# Patient Record
Sex: Female | Born: 1945 | Race: Black or African American | Hispanic: No | Smoking: Light tobacco smoker
Health system: Southern US, Community
[De-identification: ages and names within clinical notes are randomized; demographics above are authoritative.]

## PROBLEM LIST (undated history)

## (undated) DIAGNOSIS — I209 Angina pectoris, unspecified: Secondary | ICD-10-CM

## (undated) DIAGNOSIS — M329 Systemic lupus erythematosus, unspecified: Secondary | ICD-10-CM

## (undated) DIAGNOSIS — J45909 Unspecified asthma, uncomplicated: Secondary | ICD-10-CM

## (undated) DIAGNOSIS — D649 Anemia, unspecified: Secondary | ICD-10-CM

## (undated) DIAGNOSIS — I219 Acute myocardial infarction, unspecified: Secondary | ICD-10-CM

## (undated) DIAGNOSIS — K219 Gastro-esophageal reflux disease without esophagitis: Secondary | ICD-10-CM

## (undated) DIAGNOSIS — H269 Unspecified cataract: Secondary | ICD-10-CM

## (undated) DIAGNOSIS — I10 Essential (primary) hypertension: Secondary | ICD-10-CM

## (undated) DIAGNOSIS — M1990 Unspecified osteoarthritis, unspecified site: Secondary | ICD-10-CM

## (undated) DIAGNOSIS — J439 Emphysema, unspecified: Secondary | ICD-10-CM

## (undated) DIAGNOSIS — I2510 Atherosclerotic heart disease of native coronary artery without angina pectoris: Secondary | ICD-10-CM

## (undated) HISTORY — DX: Anemia, unspecified: D64.9

## (undated) HISTORY — DX: Unspecified asthma, uncomplicated: J45.909

## (undated) HISTORY — DX: Unspecified cataract: H26.9

## (undated) HISTORY — DX: Acute myocardial infarction, unspecified: I21.9

## (undated) HISTORY — DX: Unspecified osteoarthritis, unspecified site: M19.90

## (undated) HISTORY — DX: Emphysema, unspecified: J43.9

---

## 1993-12-11 HISTORY — PX: CORONARY STENT PLACEMENT: SHX1402

## 1998-08-04 ENCOUNTER — Ambulatory Visit (HOSPITAL_COMMUNITY): Admission: RE | Admit: 1998-08-04 | Discharge: 1998-08-04 | Payer: Self-pay | Admitting: Internal Medicine

## 1998-08-19 ENCOUNTER — Ambulatory Visit (HOSPITAL_COMMUNITY): Admission: RE | Admit: 1998-08-19 | Discharge: 1998-08-19 | Payer: Self-pay | Admitting: Gastroenterology

## 1999-10-14 ENCOUNTER — Ambulatory Visit (HOSPITAL_COMMUNITY): Admission: RE | Admit: 1999-10-14 | Discharge: 1999-10-14 | Payer: Self-pay | Admitting: Obstetrics and Gynecology

## 1999-10-14 ENCOUNTER — Encounter (INDEPENDENT_AMBULATORY_CARE_PROVIDER_SITE_OTHER): Payer: Self-pay

## 2000-01-18 ENCOUNTER — Encounter: Admission: RE | Admit: 2000-01-18 | Discharge: 2000-01-18 | Payer: Self-pay | Admitting: Internal Medicine

## 2000-01-18 ENCOUNTER — Encounter: Payer: Self-pay | Admitting: Internal Medicine

## 2000-06-26 ENCOUNTER — Ambulatory Visit (HOSPITAL_COMMUNITY): Admission: RE | Admit: 2000-06-26 | Discharge: 2000-06-26 | Payer: Self-pay | Admitting: Cardiovascular Disease

## 2000-06-27 ENCOUNTER — Encounter: Payer: Self-pay | Admitting: Cardiovascular Disease

## 2000-07-06 ENCOUNTER — Encounter: Payer: Self-pay | Admitting: Cardiovascular Disease

## 2000-07-06 ENCOUNTER — Ambulatory Visit (HOSPITAL_COMMUNITY): Admission: RE | Admit: 2000-07-06 | Discharge: 2000-07-06 | Payer: Self-pay | Admitting: Cardiovascular Disease

## 2000-08-21 ENCOUNTER — Other Ambulatory Visit: Admission: RE | Admit: 2000-08-21 | Discharge: 2000-08-21 | Payer: Self-pay | Admitting: Obstetrics and Gynecology

## 2001-05-06 ENCOUNTER — Encounter: Payer: Self-pay | Admitting: Emergency Medicine

## 2001-05-06 ENCOUNTER — Emergency Department (HOSPITAL_COMMUNITY): Admission: EM | Admit: 2001-05-06 | Discharge: 2001-05-06 | Payer: Self-pay | Admitting: Emergency Medicine

## 2001-12-31 ENCOUNTER — Other Ambulatory Visit: Admission: RE | Admit: 2001-12-31 | Discharge: 2001-12-31 | Payer: Self-pay | Admitting: Obstetrics and Gynecology

## 2002-02-04 ENCOUNTER — Ambulatory Visit (HOSPITAL_COMMUNITY): Admission: RE | Admit: 2002-02-04 | Discharge: 2002-02-04 | Payer: Self-pay | Admitting: Obstetrics and Gynecology

## 2002-02-04 ENCOUNTER — Encounter: Payer: Self-pay | Admitting: Obstetrics and Gynecology

## 2002-02-20 ENCOUNTER — Encounter: Payer: Self-pay | Admitting: Obstetrics and Gynecology

## 2002-02-20 ENCOUNTER — Encounter: Admission: RE | Admit: 2002-02-20 | Discharge: 2002-02-20 | Payer: Self-pay | Admitting: Obstetrics and Gynecology

## 2003-02-25 ENCOUNTER — Emergency Department (HOSPITAL_COMMUNITY): Admission: EM | Admit: 2003-02-25 | Discharge: 2003-02-25 | Payer: Self-pay | Admitting: Emergency Medicine

## 2003-08-07 ENCOUNTER — Other Ambulatory Visit: Admission: RE | Admit: 2003-08-07 | Discharge: 2003-08-07 | Payer: Self-pay | Admitting: Obstetrics and Gynecology

## 2004-05-26 ENCOUNTER — Inpatient Hospital Stay (HOSPITAL_COMMUNITY): Admission: EM | Admit: 2004-05-26 | Discharge: 2004-05-30 | Payer: Self-pay | Admitting: Emergency Medicine

## 2004-07-04 ENCOUNTER — Ambulatory Visit (HOSPITAL_COMMUNITY): Admission: RE | Admit: 2004-07-04 | Discharge: 2004-07-04 | Payer: Self-pay | Admitting: Obstetrics and Gynecology

## 2005-09-14 ENCOUNTER — Encounter: Admission: RE | Admit: 2005-09-14 | Discharge: 2005-09-14 | Payer: Self-pay | Admitting: Cardiology

## 2005-11-08 ENCOUNTER — Encounter: Admission: RE | Admit: 2005-11-08 | Discharge: 2005-11-08 | Payer: Self-pay | Admitting: Gastroenterology

## 2005-12-08 ENCOUNTER — Ambulatory Visit (HOSPITAL_COMMUNITY): Admission: RE | Admit: 2005-12-08 | Discharge: 2005-12-08 | Payer: Self-pay | Admitting: Gastroenterology

## 2005-12-08 ENCOUNTER — Encounter (INDEPENDENT_AMBULATORY_CARE_PROVIDER_SITE_OTHER): Payer: Self-pay | Admitting: *Deleted

## 2006-06-19 ENCOUNTER — Inpatient Hospital Stay (HOSPITAL_BASED_OUTPATIENT_CLINIC_OR_DEPARTMENT_OTHER): Admission: RE | Admit: 2006-06-19 | Discharge: 2006-06-19 | Payer: Self-pay | Admitting: Cardiology

## 2006-10-11 ENCOUNTER — Encounter: Admission: RE | Admit: 2006-10-11 | Discharge: 2006-10-11 | Payer: Self-pay | Admitting: Cardiology

## 2006-12-11 LAB — HM COLONOSCOPY

## 2007-03-13 ENCOUNTER — Encounter: Admission: RE | Admit: 2007-03-13 | Discharge: 2007-03-13 | Payer: Self-pay | Admitting: Cardiology

## 2008-04-20 ENCOUNTER — Emergency Department (HOSPITAL_COMMUNITY): Admission: EM | Admit: 2008-04-20 | Discharge: 2008-04-20 | Payer: Self-pay | Admitting: Emergency Medicine

## 2008-09-08 ENCOUNTER — Encounter: Admission: RE | Admit: 2008-09-08 | Discharge: 2008-09-08 | Payer: Self-pay | Admitting: Cardiology

## 2009-08-29 ENCOUNTER — Emergency Department (HOSPITAL_COMMUNITY): Admission: EM | Admit: 2009-08-29 | Discharge: 2009-08-29 | Payer: Self-pay | Admitting: Family Medicine

## 2009-10-14 ENCOUNTER — Encounter: Admission: RE | Admit: 2009-10-14 | Discharge: 2009-10-14 | Payer: Self-pay | Admitting: Cardiology

## 2010-11-26 ENCOUNTER — Emergency Department (HOSPITAL_COMMUNITY)
Admission: EM | Admit: 2010-11-26 | Discharge: 2010-11-26 | Payer: Self-pay | Source: Home / Self Care | Admitting: Emergency Medicine

## 2010-12-31 ENCOUNTER — Encounter: Payer: Self-pay | Admitting: Cardiology

## 2011-01-01 ENCOUNTER — Encounter: Payer: Self-pay | Admitting: Obstetrics and Gynecology

## 2011-01-02 ENCOUNTER — Encounter: Payer: Self-pay | Admitting: Cardiology

## 2011-01-02 ENCOUNTER — Encounter
Admission: RE | Admit: 2011-01-02 | Discharge: 2011-01-02 | Payer: Self-pay | Source: Home / Self Care | Attending: Cardiology | Admitting: Cardiology

## 2011-04-28 NOTE — Cardiovascular Report (Signed)
NAME:  [name], [name]                        ACCOUNT NO.:  [id number]   MEDICAL RECORD NO.:  [id number]                   PATIENT TYPE:  INP   LOCATION:  3735                                 FACILITY:  [facility]   PHYSICIAN:  Mohan N. Sharyn Lull, M.D.              DATE OF BIRTH:  02/23/1946   DATE OF PROCEDURE:  05/27/2004  DATE OF DISCHARGE:                              CARDIAC CATHETERIZATION   PROCEDURE:  1. Left cardiac catheterization with selective right and left coronary     angiography, left ventriculography via the right groin using Judkins     technique.  2. Successful percutaneous transluminal coronary angioplasty to the ostial     ramus using 3.0 x 6 mm long cutting balloon,.  3. Successful deployment of 3.0 x 8 mm long Cypher drug-eluting stent in the     ostial and proximal ramus.   CARDIOLOGISTEduardo Osier Sharyn Lull, M.D.   INDICATIONS:  [name] is a 65 year old black female with past medical  history significant for asthmatic bronchitis, history of tobacco abuse,  history of lupus.  He came to the ER complaining of retrosternal chest pain  described as tightness and fullness off and on for the last 10 days, lasting  a few minutes, occasionally radiating to the left shoulder and left arm.  Rating was 8/10 and so desired to come to the ER.  The patient denies any  nausea, vomiting but complains of diaphoresis and mild shortness of breath.  The patient denies any palpitations, lightheadedness or syncope.  Denies  rest or nocturnal angina.  Denies PND or orthopnea or leg swelling.  Denies  cough, fever, chills.  Denies relation of chest pain to breathing, food.  The patient was noted to have mildly elevated troponin I with normal CPKs.  An EKG done in the ER showed normal sinus rhythm with __________ Q-waves,  poor R-wave progression in V1 to V4 and nonspecific T-wave changes which  were new as compared to a prior EKG.  The patient was admitted to CCU.  The  patient  ruled in for non-Q wave infarction due to mildly elevated troponin-  I.  Her repeat two sets of troponin-I were 0.36 and 0.30, although CPKs were  normal.  Due to typical chest pain and multiple risk factors and elevated  troponin-I, discussed with patient regarding left catheterization, possible  PTCA and stenting, risks of death, myocardial infarction, stroke, need for  emergency CABG, risks of restenosis, local vascular complications, etc., and  consented for PCI.   DESCRIPTION OF PROCEDURE:  After obtaining the informed consent, the patient  was brought to the catheterization lab and was placed on the fluoroscopy  table.  The right groin was prepped and draped in the usual fashion.  Xylocaine 2% was used for local anesthesia in the right groin.  With the  help of a thin wall needle, a 6 French arterial sheath was placed.  The  sheath was aspirated and flushed.  Next, 6 French left Judkins catheter was  advanced over the wire under fluoroscopic guidance up to the ascending  aorta.  The wire was pulled out.  The catheter was aspirated and connected  to the manifold.  The catheter was further advanced and engaged into the  left coronary ostium.  Multiple views of the left system were taken.  Next,  the catheter was disengaged and was pulled out over the wire and was  replaced with a 6 French right Judkins catheter, which was advanced over the  wire under fluoroscopic guidance to the ascending aorta.  The wire was  pulled out.  The catheter was aspirated and connected to the manifold.  The  catheter was further advanced and engaged into the right coronary ostium.  Multiple views of the right system were taken.  Next, the catheter was  disengaged and was pulled out over the wire and was replaced with a 6 French  pigtail catheter, which was advanced over the wire under fluoroscopic  guidance up to the ascending aorta.  The wire was pulled out.  The catheter  was aspirated and connected to  the manifold.  The catheter was further  advanced across the aortic valve into the LV.  The LV pressures were  recorded.  Next left ventriculography was done in 30 degree RAO position.  Post angiographic pressures were recorded from LV, and then pullback  pressures were recorded from the aorta.  There was no gradient across the  aortic valve.   Next, the pigtail catheter was pulled out over the wire.  Sheaths were  aspirated and flushed.   FINDINGS:  1. Left ventricle show good LV systolic function with an EF of 55-60%.  2. The left main was patent.  3. The left anterior descending has 20-30% mid stenosis.  The diagonal I was     very, very small which was patent.  The diagonal II was very small which     was patent.  The ramus was large which has 80-90% focal ostial stenosis     with haziness which appears to be the culprit lesion.  4. The left circumflex was small which was patent.  5. The right coronary artery was patent.   INTERVENTIONAL PROCEDURE:  Successful percutaneous transluminal coronary  angioplasty to the ostial ramus was done using 3.0 x 6 mm long cutting  balloon.  Multiple inflations were done going up to 4 atmospheres of  pressure with persistent haziness and mild elastic recoil.  Then a 3.0 x 8  mm long Cypher drug-eluting stent was deployed at 10 atmospheres of pressure  which was fully expanded up to 13 atmospheres of pressure.  The lesion was  dilated from 80-90% with haziness to less than 10% residual with excellent  TIMI-3 distal flow without evidence of dissection or distal embolization.  The patient received weight-based heparin, Integrilin and 600 mg of Plavix  and IC nitroglycerin during the procedure.  The patient tolerated the  procedure well.  There were no complications.  The patient was transferred  to the recovery room in stable condition.                                               Eduardo Osier. Sharyn Lull, M.D.   MNH/MEDQ  D:  05/27/2004  T:   05/29/2004  Job:  [id number]   cc:   Minerva Areola L. August Saucer, M.D.  [address] [address]  [place]  [place] [address]  Fax: [phone]   Catheterization Lab

## 2011-04-28 NOTE — Discharge Summary (Signed)
NAME:  [name], [name]                        ACCOUNT NO.:  [id number]   MEDICAL RECORD NO.:  [id number]                   PATIENT TYPE:  INP   LOCATION:  3735                                 FACILITY:  [facility]   PHYSICIAN:  Mohan N. Sharyn Lull, M.D.              DATE OF BIRTH:  Apr 04, 1946   DATE OF ADMISSION:  05/26/2004  DATE OF DISCHARGE:  05/30/2004                                 DISCHARGE SUMMARY   ADMITTING DIAGNOSES:  1. Probable recent small non-Q-wave myocardial infarction.  2. Tobacco abuse.  3. Morbid obesity.  4. Lupus.  5. Postmenopausal.   FINAL DIAGNOSES:  1. Status post small non-Q-wave myocardial infarction.  2. Status post percutaneous transluminal coronary angioplasty stenting to     ostial ramus.  3. Hypertension.  4. Tobacco abuse.  5. Morbid obesity.  6. History of lupus.  7. Postmenopausal.  8. Morbid obesity.   DISCHARGE HOME MEDICATIONS:  1. Enteric-coated aspirin 325 mg 1 tablet daily.  2. Plavix 75 mg 1 tablet daily with food.  3. Toprol-XL 25 mg 1 tablet daily.  4. Lipitor 10 mg 1 tablet daily.  5. Nitrostat 0.4 mg sublingual -- use as directed.   ACTIVITY:  Avoid heavy lifting, pushing or pulling for 48 hours.   DIET:  Low-salt, low-cholesterol diet.   DISCHARGE INSTRUCTIONS:  Post-angioplasty and stent instructions have been  given.   FOLLOWUP:  Follow up with me in 1 week and Dr. Minerva Areola L. Dean as scheduled.   CONDITION AT DISCHARGE:  Stable.   BRIEF HISTORY:  Ms. Bradstreet is a 65 year old black female with a past  medical history significant for asthmatic bronchitis, tobacco abuse and  history of lupus.  She came to the ER complaining of retrosternal chest pain  described as tightness more or less off and on since last 10 days, lasting a  few minutes, occasionally radiating to the left shoulder and left arm.  Today, chest pain was 8/10, so decided to come to the ER.  Denies any nausea  or vomiting, but complains of diaphoresis and mild  shortness of breath and  occasional skipping of the heart beat.  The patient denies any palpitations,  lightheadedness or syncope; states chest pain relieved with rest; denies  rest or nocturnal angina; denies PND, orthopnea or leg swelling; denies  cough, fever or chills; denies relation of chest pain to breathing, food.  The patient was noted to have mildly elevated troponin I with normal CPKs.   PAST MEDICAL HISTORY:  Past medical history as above.   PAST SURGICAL HISTORY:  None.   ALLERGIES:  No known drug allergies.   MEDICATIONS:  She takes:  1. Allegra 180 mg p.o. daily p.r.n.  2. Advair 100/50 mcg b.i.d.  3. Premarin 0.9 mg daily.   SOCIAL HISTORY:  She is divorced, no children, smokes a half a pack pre day  for 20-25 years, drinks whiskey socially, was a teacher's  assistant for  [employer], born in [place], lives in [place].   FAMILY HISTORY:  Mother is alive; she is 34, she has enlarged heart, she is  a diabetic, also has rheumatoid arthritis.  Father died of respiratory  failure.  One sister is hypertensive.   PHYSICAL EXAMINATION:  GENERAL:  On examination, she is alert, awake,  oriented x3, in no acute distress.  VITAL SIGNS:  Blood pressure was 122/78; pulse was 68, regular.  HEENT:  Conjunctivae were pink.  NECK:  Neck supple; no JVD, no bruit.  LUNGS:  Lungs were clear to auscultation without rhonchi or rales.  CARDIOVASCULAR EXAM:  S1 and S2 were soft.  There was no S3 gallop.  There  was no murmur.  ABDOMEN:  Abdomen was soft, bowel sounds were present, nontender, obese.  EXTREMITIES:  There was no clubbing, cyanosis, or edema.   LABORATORIES:  EKG shows normal sinus rhythm, septal Q waves, poor R wave  progression in V1 to V4, nonspecific T wave changes which were new.   Troponin I:  Three sets of point of care were 0.10, 0.13, 0.15.  Glucose was  92, BUN 8, creatinine 0.7.  Hemoglobin was 13.9, hematocrit 41.  Potassium  was 3.7.   Her other labs:  Cholesterol was 109, LDL of 40, HDL of 43.  Her 4  sets of CPK-MBs were negative; CK was 53, MB 1.4; second set -- CK 51, MB  1.5; post-procedure CK was 54, MB 2.1.  Her troponin I's, however, were  slightly elevated at 0.30, 0.28 and 0.36.  Homocysteine level was in normal  range at 9.23.  Her post-procedure basic metabolic panel on May 28, 2004:  BUN 7, creatinine 0.8, potassium was 3.4, which has been replaced.   BRIEF HOSPITAL COURSE:  The patient was admitted to the CCU.  The patient  ruled in for a small non-Q-wave MI due to elevated troponin I and typical  anginal chest pain.  The patient underwent PTCA and stenting on May 27, 2004 to ostial ramus as per procedure report.  Patient tolerated the  procedure well.  There were no complications.  Post procedure, the patient  did not have any episodes of chest pain during the hospital stay.  Post  procedure, her  CPK was in normal range.  The patient has been ambulating in the hallway  without any problems.  The patient will be discharged home on above  medications and will be followed up in my office in 1 week and Dr. Minerva Areola L.  Dean as scheduled.                                                Eduardo Osier. Sharyn Lull, M.D.    MNH/MEDQ  D:  05/30/2004  T:  06/01/2004  Job:  [id number]   cc:   Minerva Areola L. August Saucer, M.D.  [address] [address]  [place]  [place] [address]  Fax: [phone]

## 2011-04-28 NOTE — Cardiovascular Report (Signed)
NAMEBIJOU, EASLER              ACCOUNT NO.:  [id number]   MEDICAL RECORD NO.:  [id number]          PATIENT TYPE:  OIB   LOCATION:  1962                         FACILITY:  [facility]   PHYSICIAN:  Mohan N. Sharyn Lull, M.D. DATE OF BIRTH:  August 22, 1946   DATE OF PROCEDURE:  06/19/2006  DATE OF DISCHARGE:                              CARDIAC CATHETERIZATION   PROCEDURE:  Left cardiac catheterization with selective left and right  coronary angiograph, left ventriculography via right groin using Judkins  technique.   INDICATION FOR THE PROCEDURE:  Ms. Rund is a 65 year old black female  with past medical history significant for coronary artery disease, history  of non-Q-wave myocardial infarction status post PTCA stenting to ostial  ramus in June 2005, hypertension, hypercholesteremia, history of tobacco  abuse, morbid obesity.  Complains of retrosternal and left-sided chest pain  described as tightness radiating to the left arm and left shoulder  associated with nausea and diaphoresis, relieved with rest and sublingual  nitroglycerin.  States chest pain feels similar in nature when she had MI.  She states she is under lot of stress lately.  Also gives history of  exertional dyspnea and feeling fatigued and weak.  Denies any chest pain at  present.   PAST MEDICAL HISTORY:  As above.   PAST SURGICAL HISTORY:  She had to PCI to ostium of ramus.  Had a 3.0 x 8 mm  long Cypher drug-eluting stent in June 2005.   SOCIAL HISTORY:  She is divorced.  Smoked half pack per day for 25 years.  No history of alcohol abuse.  She works for [employer].   FAMILY HISTORY:  Is negative for coronary artery disease.   ALLERGIES:  No known drug allergies.   MEDICATIONS:  At home she is on:  1.  Toprol-XL 50 mg p.o. daily.  2.  Altace 5 mg p.o. daily which was increased to 10 mg p.o. daily.  3.  Enteric-coated aspirin 81 mg two p.o. daily.  4.  Nitrostat 0.4 mg sublingual daily.  5.   Lipitor 10 mg p.o. daily.  6.  Imdur was added 60 mg p.o. q.a.m.   EXAMINATION:  GENERAL:  She is alert, awake, oriented x3, in no acute  distress.  VITAL SIGNS:  Blood pressure was 170/96, pulse was 62, regular.  HEENT:  Conjunctivae were pink.  NECK:  Supple.  No JVD, no bruit.  LUNGS:  Clear to auscultation without rhonchi or rales.  CARDIOVASCULAR:  S1, S2 was normal.  There was soft S4 gallop and systolic  murmur.  ABDOMEN:  Soft.  Bowel sounds were present, obese, nontender.  EXTREMITIES:  There was no clubbing, cyanosis or edema.   IMPRESSION:  1.  New onset angina.  2.  Uncontrolled hypertension.  3.  Hypercholesteremia.  4.  History of tobacco abuse.  5.  Morbid obesity.   PLAN:  Add Imdur 60 mg p.o. q.a.m., increase the Altace to 10 mg p.o. daily,  Nitrostat sublingual p.r.n.  Discussed with the patient regarding  noninvasive stress testing versus left catheterization, its risks and  benefits, i.e. death, MI, stroke,  local vascular complications.  Accepted  and consented for left catheterization.   PROCEDURE:  After obtaining the informed consent the patient was brought to  the catheterization laboratory and was placed on fluoroscopy table.  The  right groin was prepared and draped in the usual fashion.  Xylocaine 2% was  used for local anesthesia in the right groin.  With the help of a thin-wall  needle 4-French arterial sheath was placed.  The sheath was aspirated and  flushed.  Next, 4-French left Judkins catheter was advanced over the wire  under fluoroscopic guidance up to the ascending aorta.  Wire was pulled out,  the catheter was aspirated and connected to the manifold.  Catheter was  further advanced and engaged into the left coronary ostium.  Multiple views  of the left system were taken.  Next, the catheter was disengaged and was  pulled out over the wire and was replaced with 4-French right 3D catheter  which was advanced over the wire under fluoroscopic  guidance up to the  ascending aorta.  Catheter was further advanced and engaged into the right  coronary ostium.  Multiple views of the right system were taken.  Next, the  catheter was disengaged and was pulled out over the wire and was replaced  with 4-French pigtail catheter which was advanced over the wire under  fluoroscopic guidance up to the ascending aorta.  Catheter was further  advanced across the aortic valve into the LV.  LV pressures were recorded.  Next, left ventriculogram was done in 30-degree RAO position.  Post  angiographic pressures were recorded from LV and then pullback pressures  were recorded from the aorta.  There was no gradient across the aortic  valve.  Next, the pigtail catheter was pulled out over the wire.  Sheaths  were aspirated and flushed.   FINDINGS:  LV showed good LV systolic function, LVH, EF of 04-54%.  Left  main was short which was patent.  LAD has 20-25% mid stenosis.  Diagonal one  was very, very small.  Diagonal two was small which was patent.  Ramus was  patent at the stented site; however, edges of the stent appears to be  slightly hazy, but there was no flow-limiting lesion with TIMI grade 3  distal flow.  Left circumflex was small which was patent.  OM-1 and OM-2  were very small which were patent.  RCA was patent.  The patient tolerated  the procedure well.  There were no complications.  The patient was  transferred to recovery room in stable condition.   PLAN:  Continue with same medications.  We will restart her Plavix 75 mg  p.o. daily with food.  If her blood pressure continues to stay elevated,  will add a small dose of calcium channel blocker.           ______________________________  Eduardo Osier Sharyn Lull, M.D.    MNH/MEDQ  D:  06/19/2006  T:  06/19/2006  Job:  [id number]   cc:   Cath Lab   Minerva Areola L. August Saucer, M.D.  Fax: [phone]

## 2011-04-28 NOTE — Cardiovascular Report (Signed)
[place]  Patient:    [name], [name]                     MRN: [id number] Proc. Date: 07/06/00 Adm. Date:  [id number] Disc. Date: [id number] Attending:  Orpah Cobb S                        Cardiac Catheterization  PROCEDURE:  Left heart catheterization, selective coronary angiography and left ventricular function study, and descending aortography.  INDICATIONS:  This 65 year old black female had atypical chest pain with abnormal stress test and ischemia on nuclear images, along with cardiac risk factor of smoking and family history of coronary artery disease.  COMPLICATIONS:  None.  APPROACH:  Left femoral artery using 6 French diagnostic catheters.  Right femoral artery could not be accessed.  HEMODYNAMIC DATA:  The left ventricular pressure was 151/16 mmHg.  The aortic pressure was 151/86 mmHg.  LEFT VENTRICULOGRAM:  The left ventriculogram showed normal left ventricular systolic function with ejection fraction of 70%.  DESCENDING AORTOGRAM:  The descending aortogram showed normal renal arteries.  CORONARY ANATOMY:  Left main:  The left main coronary artery was unremarkable.  Left left anterior descending:  Left anterior descending coronary artery showed 20-30% distal disease with plus ______ narrowing at the tip of the heart.  Left circumflex:  The left circumflex coronary artery was small and unremarkable and the ramus branch was also unremarkable.  Right coronary artery:  The right coronary artery was dominant and had mild proximal narrowing, otherwise was unremarkable.  IMPRESSION: 1. Mild coronary artery disease. 2. Normal left ventricular systolic function. 3. Normal renal arteries.  RECOMMENDATIONS:  This patient will be treated medically for now and the patient was strongly recommended to change diet, increase activity gradually and discontinue smoking. DD:  07/06/00 TD:  07/07/00 Job: [id number]

## 2011-06-02 ENCOUNTER — Emergency Department (HOSPITAL_COMMUNITY): Payer: Medicare Other

## 2011-06-02 ENCOUNTER — Inpatient Hospital Stay (HOSPITAL_COMMUNITY)
Admission: EM | Admit: 2011-06-02 | Discharge: 2011-06-05 | DRG: 311 | Disposition: A | Discharge status: Home / Self Care | Payer: Medicare Other | Attending: Cardiology | Admitting: Cardiology

## 2011-06-02 DIAGNOSIS — I200 Unstable angina: Principal | ICD-10-CM | POA: Diagnosis present

## 2011-06-02 DIAGNOSIS — Z7982 Long term (current) use of aspirin: Secondary | ICD-10-CM

## 2011-06-02 DIAGNOSIS — I10 Essential (primary) hypertension: Secondary | ICD-10-CM | POA: Diagnosis present

## 2011-06-02 DIAGNOSIS — Z9861 Coronary angioplasty status: Secondary | ICD-10-CM

## 2011-06-02 DIAGNOSIS — I2510 Atherosclerotic heart disease of native coronary artery without angina pectoris: Secondary | ICD-10-CM | POA: Diagnosis present

## 2011-06-02 DIAGNOSIS — F17200 Nicotine dependence, unspecified, uncomplicated: Secondary | ICD-10-CM | POA: Diagnosis present

## 2011-06-02 DIAGNOSIS — I252 Old myocardial infarction: Secondary | ICD-10-CM

## 2011-06-02 DIAGNOSIS — E7800 Pure hypercholesterolemia, unspecified: Secondary | ICD-10-CM | POA: Diagnosis present

## 2011-06-02 DIAGNOSIS — Z8249 Family history of ischemic heart disease and other diseases of the circulatory system: Secondary | ICD-10-CM

## 2011-06-02 LAB — COMPREHENSIVE METABOLIC PANEL
AST: 20 U/L (ref 0–37)
Albumin: 3.5 g/dL (ref 3.5–5.2)
Alkaline Phosphatase: 92 U/L (ref 39–117)
BUN: 10 mg/dL (ref 6–23)
Chloride: 108 mEq/L (ref 96–112)
Potassium: 3.4 mEq/L — ABNORMAL LOW (ref 3.5–5.1)
Total Bilirubin: 0.2 mg/dL — ABNORMAL LOW (ref 0.3–1.2)
Total Protein: 6.2 g/dL (ref 6.0–8.3)

## 2011-06-02 LAB — CBC
HCT: 38.4 % (ref 36.0–46.0)
Hemoglobin: 13.3 g/dL (ref 12.0–15.0)
MCH: 31.9 pg (ref 26.0–34.0)
MCHC: 34.6 g/dL (ref 30.0–36.0)
MCV: 92.1 fL (ref 78.0–100.0)
RDW: 12.6 % (ref 11.5–15.5)

## 2011-06-02 LAB — URINALYSIS, ROUTINE W REFLEX MICROSCOPIC
Bilirubin Urine: NEGATIVE
Glucose, UA: NEGATIVE mg/dL
Ketones, ur: NEGATIVE mg/dL
Leukocytes, UA: NEGATIVE
Nitrite: NEGATIVE
Specific Gravity, Urine: 1.025 (ref 1.005–1.030)
pH: 5.5 (ref 5.0–8.0)

## 2011-06-02 LAB — URINE MICROSCOPIC-ADD ON

## 2011-06-02 LAB — PRO B NATRIURETIC PEPTIDE: Pro B Natriuretic peptide (BNP): 240.3 pg/mL — ABNORMAL HIGH (ref 0–125)

## 2011-06-02 LAB — APTT: aPTT: 195 seconds — ABNORMAL HIGH (ref 24–37)

## 2011-06-02 LAB — DIFFERENTIAL
Eosinophils Relative: 3 % (ref 0–5)
Lymphocytes Relative: 33 % (ref 12–46)
Lymphs Abs: 2.3 10*3/uL (ref 0.7–4.0)
Monocytes Absolute: 0.4 10*3/uL (ref 0.1–1.0)
Monocytes Relative: 5 % (ref 3–12)
Neutro Abs: 4.1 10*3/uL (ref 1.7–7.7)

## 2011-06-02 LAB — TROPONIN I: Troponin I: <0.3 ng/mL (ref <0.30)

## 2011-06-02 LAB — CK TOTAL AND CKMB (NOT AT ARMC): Relative Index: INVALID (ref 0.0–2.5)

## 2011-06-03 ENCOUNTER — Inpatient Hospital Stay (HOSPITAL_COMMUNITY): Payer: Medicare Other

## 2011-06-03 LAB — LIPID PANEL
HDL: 54 mg/dL (ref >39)
LDL Cholesterol: 65 mg/dL (ref 0–99)
Total CHOL/HDL Ratio: 2.7 RATIO
Triglycerides: 146 mg/dL (ref <150)
VLDL: 29 mg/dL (ref 0–40)

## 2011-06-03 LAB — CARDIAC PANEL(CRET KIN+CKTOT+MB+TROPI): Troponin I: <0.3 ng/mL (ref <0.30)

## 2011-06-03 LAB — CBC
MCH: 31.7 pg (ref 26.0–34.0)
MCV: 92.8 fL (ref 78.0–100.0)
Platelets: 231 10*3/uL (ref 150–400)
RDW: 12.6 % (ref 11.5–15.5)

## 2011-06-03 LAB — HEPARIN LEVEL (UNFRACTIONATED)
Heparin Unfractionated: 0.53 IU/mL (ref 0.30–0.70)
Heparin Unfractionated: 0.44 IU/mL (ref 0.30–0.70)

## 2011-06-04 ENCOUNTER — Inpatient Hospital Stay (HOSPITAL_COMMUNITY): Payer: Medicare Other

## 2011-06-04 ENCOUNTER — Other Ambulatory Visit (HOSPITAL_COMMUNITY): Payer: Medicare Other

## 2011-06-04 LAB — CBC
HCT: 36.5 % (ref 36.0–46.0)
Hemoglobin: 12.5 g/dL (ref 12.0–15.0)
MCH: 31.7 pg (ref 26.0–34.0)
MCV: 92.6 fL (ref 78.0–100.0)
RBC: 3.94 MIL/uL (ref 3.87–5.11)

## 2011-06-04 LAB — BASIC METABOLIC PANEL
CO2: 24 mEq/L (ref 19–32)
Chloride: 108 mEq/L (ref 96–112)
Creatinine, Ser: 0.61 mg/dL (ref 0.50–1.10)
Glucose, Bld: 104 mg/dL — ABNORMAL HIGH (ref 70–99)

## 2011-06-04 LAB — HEPARIN LEVEL (UNFRACTIONATED): Heparin Unfractionated: 0.38 IU/mL (ref 0.30–0.70)

## 2011-06-04 MED ORDER — TECHNETIUM TC 99M TETROFOSMIN IV KIT
10.0000 | PACK | Freq: Once | INTRAVENOUS | Status: AC | PRN
  Administered 2011-06-03: 10 via INTRAVENOUS

## 2011-06-04 MED ORDER — TECHNETIUM TC 99M TETROFOSMIN IV KIT
30.0000 | PACK | Freq: Once | INTRAVENOUS | Status: AC | PRN
  Administered 2011-06-04: 30 via INTRAVENOUS

## 2011-06-05 ENCOUNTER — Other Ambulatory Visit (HOSPITAL_COMMUNITY): Payer: Medicare Other

## 2011-06-28 NOTE — Discharge Summary (Signed)
[name], [name]              ACCOUNT NO.:  [id number]  MEDICAL RECORD NO.:  [id number]  LOCATION:  3739                         FACILITY:  [facility]  PHYSICIAN:  Eduardo Osier. Sharyn Lull, M.D. DATE OF BIRTH:  Jun 13, 1946  DATE OF ADMISSION:  06/02/2011 DATE OF DISCHARGE:  06/05/2011                              DISCHARGE SUMMARY   ADMITTING DIAGNOSES:  Unstable angina, rule out myocardial infarction, coronary artery disease, history of non-Q-wave myocardial infarction in the past status post percutaneous coronary intervention to ramus branch in the past, hypertension, hypercholesteremia, tobacco abuse, morbid obesity, positive family history of coronary artery disease.  DISCHARGE DIAGNOSES:  Stable angina, negative Persantine Myoview, coronary artery disease, history of non-Q-wave myocardial infarction in the past, status post percutaneous coronary intervention to ramus in the past, hypertension, hypercholesteremia, tobacco abuse, morbid obesity, positive family history of coronary artery disease.  DISCHARGE HOME MEDICATIONS: 1. Enteric-coated aspirin 81 mg 1 tablet daily. 2. Amlodipine 5 mg 1 tablet daily. 3. Isosorbide mononitrate 30 mg 1 tablet daily. 4. Crestor 10 mg 1 tablet daily. 5. Ramipril 10 mg 1 capsule twice daily. 6. Nitrostat 0.4 mg sublingual use as directed every 5 minutes. 7. Alprazolam 0.25 mg 1 tablet twice daily. 8. Percocet 5/325 1 tablet every 8 hours as needed for pain as before.  DIET:  Low salt, low cholesterol.  ACTIVITY:  As tolerated.  CONDITION AT DISCHARGE:  Stable.  Follow up with me in 1 week.  BRIEF HISTORY AND HOSPITAL COURSE:  [name] is a 65 year old black female with past medical history significant for coronary artery disease, history of non-Q-wave MI in the past status post PCI to ramus in the past, hypertension, hypercholesteremia, tobacco abuse, positive family history of coronary artery disease, morbid obesity.  She came to the  ER via EMS complaining of retrosternal chest pain described as tightness, fullness grade 8/10 associated with diaphoresis and mild shortness of breath while talking over phone in the car.  States lately she has been under a lot of stress.  She took one sublingual nitro and aspirin and two sublingual nitro by EMS with relief of chest pain. Denies any palpitation, lightheadedness or syncope.  Denies PND, orthopnea, leg swelling.  Denies relation of chest pain to food, breathing or movement.  Denies any cough, fever or chills.  PAST MEDICAL HISTORY:  As above.  PAST SURGICAL HISTORY:  None except for PTCA and stenting to ramus in the past.  SOCIAL HISTORY:  She is divorced, no children.  Smokes half pack per day for 40+ years.  Drinks beer socially, she is retired from school system.  ALLERGIES:  No known drug allergies.  MEDICATION AT HOME:  She was on aspirin, atenolol, ramipril, Xanax, hydrocodone.  FAMILY HISTORY:  Positive for coronary artery disease.  PHYSICAL EXAMINATION:  GENERAL:  She is alert, awake, and oriented x3 in no acute distress. VITAL SIGNS:  Blood pressure was 139/65, pulse was 75 and regular. EYES:  Conjunctivae was pink. NECK:  Supple, no JVD, no bruit. LUNGS:  Clear to auscultation without rhonchi or rales. CARDIOVASCULAR:  S1, S2 was normal.  There was soft systolic murmur. There was no S3 or S4 gallop. ABDOMEN:  Soft.  Bowel sounds were present, obese, nontender. EXTREMITIES:  There is no clubbing, cyanosis or edema.  LABORATORY DATA:  Hemoglobin was 13.3, hematocrit 38.4, white count of 6.9.  Sodium was 140, potassium 3.4, repeat potassium was 3.5.  BUN 10, creatinine 0.61, glucose was 98.  Her lipid panel cholesterol was 148, triglycerides 146, HDL 54, LDL 65.  Her two sets of cardiac enzymes were normal.  Persantine Myoview done yesterday showed no evidence of reversible ischemia with EF of 60%.  Chest x-ray showed no acute abnormalities.  There was  mild cardiomegaly.  BRIEF HOSPITAL COURSE:  The patient was admitted to telemetry unit.  MI was ruled out by serial enzymes and EKG.  The patient did not had any further episodes of chest pain during the hospital stay except the patient had episodes of bradycardia.  Her beta blocker dose was reduced and was held due to bradycardia.  The patient did not had any episodes of chest pain during the hospital stay.  Her Persantine Myoview test is negative for ischemia.  The patient has been ambulating in hallway without any problems and is off heparin since last night.  The patient will be discharged home on above medications and will be followed up in my office in 1 week.     Eduardo Osier. Sharyn Lull, M.D.     MNH/MEDQ  D:  06/05/2011  T:  06/05/2011  Job:  [id number]  Electronically Signed by Rinaldo Cloud M.D. on 06/28/2011 11:09:51 PM

## 2011-09-08 ENCOUNTER — Institutional Professional Consult (permissible substitution): Payer: Medicare Other | Admitting: Pulmonary Disease

## 2011-12-07 ENCOUNTER — Institutional Professional Consult (permissible substitution): Payer: Medicare Other | Admitting: Pulmonary Disease

## 2011-12-17 ENCOUNTER — Encounter: Payer: Self-pay | Admitting: Emergency Medicine

## 2011-12-17 ENCOUNTER — Emergency Department (HOSPITAL_COMMUNITY)
Admission: EM | Admit: 2011-12-17 | Discharge: 2011-12-17 | Disposition: A | Discharge status: Home / Self Care | Payer: Medicare Other | Attending: Emergency Medicine | Admitting: Emergency Medicine

## 2011-12-17 ENCOUNTER — Other Ambulatory Visit: Payer: Self-pay

## 2011-12-17 DIAGNOSIS — R61 Generalized hyperhidrosis: Secondary | ICD-10-CM | POA: Insufficient documentation

## 2011-12-17 DIAGNOSIS — I10 Essential (primary) hypertension: Secondary | ICD-10-CM | POA: Insufficient documentation

## 2011-12-17 DIAGNOSIS — R55 Syncope and collapse: Secondary | ICD-10-CM

## 2011-12-17 DIAGNOSIS — Z79899 Other long term (current) drug therapy: Secondary | ICD-10-CM | POA: Insufficient documentation

## 2011-12-17 DIAGNOSIS — R42 Dizziness and giddiness: Secondary | ICD-10-CM | POA: Insufficient documentation

## 2011-12-17 DIAGNOSIS — H538 Other visual disturbances: Secondary | ICD-10-CM | POA: Insufficient documentation

## 2011-12-17 HISTORY — DX: Essential (primary) hypertension: I10

## 2011-12-17 LAB — DIFFERENTIAL
Basophils Absolute: 0.1 10*3/uL (ref 0.0–0.1)
Lymphocytes Relative: 27 % (ref 12–46)
Monocytes Absolute: 0.5 10*3/uL (ref 0.1–1.0)
Neutro Abs: 5.5 10*3/uL (ref 1.7–7.7)

## 2011-12-17 LAB — CBC
HCT: 40.9 % (ref 36.0–46.0)
Platelets: 252 10*3/uL (ref 150–400)
RDW: 13 % (ref 11.5–15.5)
WBC: 8.5 10*3/uL (ref 4.0–10.5)

## 2011-12-17 LAB — BASIC METABOLIC PANEL
CO2: 22 mEq/L (ref 19–32)
Chloride: 106 mEq/L (ref 96–112)
Sodium: 142 mEq/L (ref 135–145)

## 2011-12-17 LAB — TROPONIN I
Troponin I: <0.3 ng/mL (ref <0.30)
Troponin I: <0.3 ng/mL (ref <0.30)

## 2011-12-17 MED ORDER — SODIUM CHLORIDE 0.9 % IV SOLN
Freq: Once | INTRAVENOUS | Status: AC
  Administered 2011-12-17: 13:00:00 via INTRAVENOUS

## 2011-12-17 NOTE — ED Notes (Signed)
Meal tray provided per pt request with md approval.

## 2011-12-17 NOTE — ED Notes (Signed)
PT states she was at church. Became weak and dizzy. Reports seeing silver color around people and objects. Pt did not lose consciousness but states she felt like she was going to. Reports headache and generalized weakness upon arriving to room.

## 2011-12-17 NOTE — ED Provider Notes (Addendum)
History     CSN: [id number]  Arrival date & time 12/17/11  1215   First MD Initiated Contact with Patient 12/17/11 1249      Chief Complaint  Patient presents with  . Near Syncope    (Consider location/radiation/quality/duration/timing/severity/associated sxs/prior treatment) The history is provided by the patient.   66 year old female was at church when she started feeling dizzy and lightheaded. She states that things got blurry and every body around her seem to be colored silver. She denies any chest pain, heaviness, tightness, or pressure. She denies any dyspnea or. There is no nausea, but she felt like she was going to have to have a bowel movement. She did get sweaty. Someone on seen took her blood pressure and it was reported to be 60/48. She was helped to a lying position and paramedics were called. At the time the paramedics arrived, blood pressure was higher but still low. Total time of this episode was approximately 30 minutes. She feels that she is back to normal again. Symptoms are described as severe. Nothing made it better nothing made it worse. Past Medical History  Diagnosis Date  . Hypertension     Past Surgical History  Procedure Date  . Coronary stent placement     No family history on file.  History  Substance Use Topics  . Smoking status: Never Smoker   . Smokeless tobacco: Not on file  . Alcohol Use: No    OB History    Grav Para Term Preterm Abortions TAB SAB Ect Mult Living                  Review of Systems  All other systems reviewed and are negative.    Allergies  Review of patient's allergies indicates no known allergies.  Home Medications   Current Outpatient Rx  Name Route Sig Dispense Refill  . ALPRAZOLAM 0.25 MG PO TABS Oral Take 0.25 mg by mouth 2 (two) times daily.      Marland Kitchen AMLODIPINE BESYLATE 5 MG PO TABS Oral Take 5 mg by mouth daily.      . ATENOLOL 25 MG PO TABS Oral Take 25 mg by mouth daily.      . ISOSORBIDE MONONITRATE  ER 30 MG PO TB24 Oral Take 30 mg by mouth daily.      Marland Kitchen RAMIPRIL 5 MG PO CAPS Oral Take 10 mg by mouth daily.      Marland Kitchen ROSUVASTATIN CALCIUM 10 MG PO TABS Oral Take 10 mg by mouth daily.        BP 105/60  Temp(Src) 98.1 F (36.7 C) (Oral)  Resp 18  SpO2 100%  Physical Exam  Nursing note and vitals reviewed.  66 year old female who is resting comfortably and in no acute distress. Vital signs show diastolic hypertension with blood pressure 133/105. Oxygen saturation is 100% which is normal. Head is normocephalic and atraumatic. PERRLA, EOMI. Oropharynx is clear. Neck is supple without adenopathy, JVD, or bruit. Lungs are clear without rales, wheezes, rhonchi. Back is nontender. Heart has regular rate and rhythm without murmur. There is no chest wall tenderness. Abdomen is soft, flat, nontender without masses or hepatosplenomegaly. Extremities have no cyanosis or edema, full range of motion present. Skin is warm and moist without rash. Neurologic: Mental status is normal, cranial nerves are intact, there no focal motor or sensory deficits. Psychiatric: No abnormalities of mood or affect. ED Course  Procedures (including critical care time)   Labs Reviewed  CBC  DIFFERENTIAL  BASIC METABOLIC PANEL  TROPONIN I  TROPONIN I   No results found. Results for orders placed during the hospital encounter of 12/17/11  CBC      Component Value Range   WBC 8.5  4.0 - 10.5 (K/uL)   RBC 4.35  3.87 - 5.11 (MIL/uL)   Hemoglobin 13.7  12.0 - 15.0 (g/dL)   HCT 78.4  69.6 - 29.5 (%)   MCV 94.0  78.0 - 100.0 (fL)   MCH 31.5  26.0 - 34.0 (pg)   MCHC 33.5  30.0 - 36.0 (g/dL)   RDW 28.4  13.2 - 44.0 (%)   Platelets 252  150 - 400 (K/uL)  DIFFERENTIAL      Component Value Range   Neutrophils Relative 64  43 - 77 (%)   Neutro Abs 5.5  1.7 - 7.7 (K/uL)   Lymphocytes Relative 27  12 - 46 (%)   Lymphs Abs 2.3  0.7 - 4.0 (K/uL)   Monocytes Relative 6  3 - 12 (%)   Monocytes Absolute 0.5  0.1 - 1.0 (K/uL)     Eosinophils Relative 1  0 - 5 (%)   Eosinophils Absolute 0.1  0.0 - 0.7 (K/uL)   Basophils Relative 1  0 - 1 (%)   Basophils Absolute 0.1  0.0 - 0.1 (K/uL)  BASIC METABOLIC PANEL      Component Value Range   Sodium 142  135 - 145 (mEq/L)   Potassium 4.2  3.5 - 5.1 (mEq/L)   Chloride 106  96 - 112 (mEq/L)   CO2 22  19 - 32 (mEq/L)   Glucose, Bld 93  70 - 99 (mg/dL)   BUN 12  6 - 23 (mg/dL)   Creatinine, Ser 1.02  0.50 - 1.10 (mg/dL)   Calcium 9.4  8.4 - 72.5 (mg/dL)   GFR calc non Af Amer 75 (*) >90 (mL/min)   GFR calc Af Amer 86 (*) >90 (mL/min)  TROPONIN I      Component Value Range   Troponin I <0.30  <0.30 (ng/mL)   No results found.    No diagnosis found.   Date: 12/17/2011  Rate: 84  Rhythm: normal sinus rhythm  QRS Axis: normal  Intervals: QT prolonged  ST/T Wave abnormalities: normal  Conduction Disutrbances:none  Narrative Interpretation: Mildly prolonged QT interval. When compared with ECG of 06/04/2011, QT interval is slightly prolonged period  Old EKG Reviewed: changes noted  She has been symptom-free in the emergency department. At this time, I am awaiting the results of the repeat troponin. If that is negative, I anticipate discharging her with followup with her cardiologist.  MDM  Probable episode of vasovagal syncope. In light of cardiac history, she will need to be kept in the emergency department for at least 2 sets of cardiac markers.        Dione Booze, MD 12/19/11 [id number]  Dione Booze, MD 01/19/12 [phone]

## 2011-12-17 NOTE — ED Notes (Signed)
Resting quietly in bed. Resp even and unlabored. Denies pain. NAD noted. Awaiting disposition.

## 2012-02-22 ENCOUNTER — Institutional Professional Consult (permissible substitution): Payer: Medicare Other | Admitting: Pulmonary Disease

## 2012-04-03 ENCOUNTER — Encounter: Payer: Self-pay | Admitting: Pulmonary Disease

## 2012-04-03 ENCOUNTER — Ambulatory Visit (INDEPENDENT_AMBULATORY_CARE_PROVIDER_SITE_OTHER): Payer: Medicare Other | Admitting: Pulmonary Disease

## 2012-04-03 VITALS — BP 142/82 | HR 70 | Temp 98.6°F | Ht 66.0 in | Wt 199.2 lb

## 2012-04-03 DIAGNOSIS — G4700 Insomnia, unspecified: Secondary | ICD-10-CM | POA: Insufficient documentation

## 2012-04-03 MED ORDER — TRAZODONE HCL 50 MG PO TABS: 50.0000 mg | ORAL_TABLET | Freq: Every day | ORAL | Status: DC

## 2012-04-03 NOTE — Assessment & Plan Note (Signed)
The patient has significant issues with sleep onset and maintenance that are probably multifactorial.  She clearly has sleep hygiene issues, and would benefit from behavioral therapy to help with this.  I suspect her pain contributes to her sleep disruption, as well as some underlying anxiety.  Her history is not suggestive of another sleep disorder.  I have had a long discussion with her about sleep hygiene, and have also discussed stimulus control with her.  She understands that sleeping medication is not the answer here, but I would like to try her on trazodone for just 2-3 weeks to see if we can reestablish her sleep schedule.  She is to followup with me in 3 weeks to see if she has made progress.

## 2012-04-03 NOTE — Progress Notes (Signed)
Addended by: Salli Quarry on: 04/03/2012 01:31 PM   Modules accepted: Orders

## 2012-04-03 NOTE — Patient Instructions (Signed)
Do not leave tv on in bedroom at all during the night If you cannot fall asleep within , leave bedroom and go to family room to read or watch tv.  Do not do anything active, do not get on computer, and do not eat or drink.  When you get sleepy, try going back to bed.  Do this as many times as you need to until you fall asleep within . Get up by 7-8 am every morning no matter how much or how little you have slept. Do not nap during day or even try to take nap No caffeine after 10am. Will try trazodone 50mg  at bedtime for 3 weeks only to see if we can "jump start" your sleep If your shoulder/arm pain is bothering you during the night, would discuss with your primary md. followup with me in 3 weeks.

## 2012-04-03 NOTE — Progress Notes (Signed)
  Subjective:    Patient ID: [name], female    DOB: 1946/05/09, 66 y.o.   MRN: [id number]  HPI The patient is a 66 year old female who I've been asked to see for insomnia.  The patient has had this issue for about 4 years, and feels that it is getting worse.  She typically goes to bed around 10 PM, but unfortunately lead to elevation on home night for background noise.  She does not have her read in bed.  He typically takes her 1-2 hours to fall asleep, and she believes this is due to her "mind racing".  She will typically stay in bed, and tosses and turns.  When she gets to sleep, she may awaken 3-4 times a night for unknown reasons, and it may take her minutes to get to sleep or hours.  She typically will start her day at 11 AM, but denies significant daytime sleepiness.  She drinks one cup of coffee each morning, but no caffeine after that.  She does not nap during the day, but does lay down and tries to take a nap.  She is quite sedentary, and does not exercise.  She has some chronic shoulder and arm pain that she believes contributes to her sleep issues, as well as some anxiety and low level depression.  She denies any kicking during the night, and does not have any significant RLS symptoms.  She does have snoring, but has never been noted to have an abnormal breathing pattern during sleep.   Review of Systems  Constitutional: Positive for appetite change. Negative for fever and unexpected weight change.  HENT: Positive for congestion and sneezing. Negative for ear pain, nosebleeds, sore throat, rhinorrhea, trouble swallowing, dental problem, postnasal drip and sinus pressure.   Eyes: Negative for redness and itching.  Respiratory: Negative for cough, chest tightness, shortness of breath and wheezing.   Cardiovascular: Positive for chest pain. Negative for palpitations and leg swelling.  Gastrointestinal: Negative for nausea and vomiting.  Genitourinary: Negative for dysuria.    Musculoskeletal: Positive for joint swelling.  Skin: Negative for rash.  Neurological: Negative for headaches.  Hematological: Does not bruise/bleed easily.  Psychiatric/Behavioral: Negative for dysphoric mood. The patient is nervous/anxious.        Objective:   Physical Exam Constitutional:  Well developed, no acute distress  HENT:  Nares patent without discharge  Oropharynx without exudate, palate and uvula are normal  Eyes:  Perrla, eomi, no scleral icterus  Neck:  No JVD, no TMG  Cardiovascular:  Normal rate, regular rhythm, no rubs or gallops.  No murmurs        Intact distal pulses  Pulmonary :  Normal breath sounds, no stridor or respiratory distress   No rales, rhonchi, or wheezing  Abdominal:  Soft, nondistended, bowel sounds present.  No tenderness noted.   Musculoskeletal:  No lower extremity edema noted.  Lymph Nodes:  No cervical lymphadenopathy noted  Skin:  No cyanosis noted  Neurologic:  Alert, appropriate, moves all 4 extremities without obvious deficit.         Assessment & Plan:

## 2012-04-24 ENCOUNTER — Ambulatory Visit: Payer: Medicare Other | Admitting: Pulmonary Disease

## 2012-05-05 ENCOUNTER — Other Ambulatory Visit: Payer: Self-pay | Admitting: Pulmonary Disease

## 2012-05-10 ENCOUNTER — Ambulatory Visit: Payer: Medicare Other | Admitting: Pulmonary Disease

## 2012-05-10 NOTE — Telephone Encounter (Signed)
Patient missed OV on 05/10/12 with KC. Pt needs to make and keep appt with [facility] to discuss.

## 2012-09-24 ENCOUNTER — Ambulatory Visit (INDEPENDENT_AMBULATORY_CARE_PROVIDER_SITE_OTHER): Payer: Medicare Other | Admitting: Pulmonary Disease

## 2012-09-24 ENCOUNTER — Encounter: Payer: Self-pay | Admitting: Pulmonary Disease

## 2012-09-24 VITALS — BP 122/78 | HR 81 | Temp 98.1°F | Ht 67.0 in | Wt 195.0 lb

## 2012-09-24 DIAGNOSIS — Z23 Encounter for immunization: Secondary | ICD-10-CM

## 2012-09-24 DIAGNOSIS — G4700 Insomnia, unspecified: Secondary | ICD-10-CM

## 2012-09-24 MED ORDER — TRAZODONE HCL 50 MG PO TABS: 50.0000 mg | ORAL_TABLET | Freq: Every day | ORAL | Status: DC

## 2012-09-24 NOTE — Assessment & Plan Note (Signed)
The patient continues to have issues with sleep onset and maintenance, however she has not stuck with behavioral therapy and sleep hygiene as outlined at the last visit.  I have gone over this again with her, and do not mind giving her another two-week trial of trazodone to help get her back on schedule.  If she sticks to this religiously, but continues to have issues, I would consider a sleep study to exclude underlying sleep disorders but suspect it is a low yield.  The pt is agreeable to this approach.

## 2012-09-24 NOTE — Progress Notes (Signed)
  Subjective:    Patient ID: [name], female    DOB: 02/27/1946, 66 y.o.   MRN: [id number]  HPI The patient comes in for followup of her known issues with sleep onset and maintenance.  She was last seen back in March, and educated on good sleep hygiene and also stimulus control therapy.  She was given a few weeks course of trazodone to help "break the cycle", but unfortunately she did not stick to the behavioral therapies.  She has also had illness and death in her family since that time, and this has contributed to her sleep issues.  She continues to bleed the TV on at night, and also sleeps into the late morning.   Review of Systems  Constitutional: Positive for unexpected weight change. Negative for fever.  HENT: Positive for ear pain, congestion, rhinorrhea and postnasal drip. Negative for nosebleeds, sore throat, sneezing, trouble swallowing, dental problem and sinus pressure.   Eyes: Negative for redness and itching.  Respiratory: Negative for cough, chest tightness, shortness of breath and wheezing.   Cardiovascular: Negative for palpitations and leg swelling.  Gastrointestinal: Negative for nausea and vomiting.  Genitourinary: Negative for dysuria.  Musculoskeletal: Negative for joint swelling.  Skin: Negative for rash.  Neurological: Negative for headaches.  Hematological: Does not bruise/bleed easily.  Psychiatric/Behavioral: Positive for dysphoric mood. The patient is nervous/anxious.        Objective:   Physical Exam Wd female in nad Nose without purulence or discharge noted. Neck without LN or TMG LE without edema or cyanosis Alert, oriented, moves all 4.        Assessment & Plan:

## 2012-09-24 NOTE — Addendum Note (Signed)
Addended by: Nita Sells on: 09/24/2012 04:38 PM   Modules accepted: Orders

## 2012-09-24 NOTE — Patient Instructions (Addendum)
We will restart again with behavioral therapies as outlined at last visit. Do not watch any tv in bed at any time. If you cannot fall asleep within , leave the bedroom and watch tv with lights out or read with low level light until you get sleepy again.  Then go back to bed. You must start your day by 7-8 am everyday no matter how little you sleep.  You need to stay awake all day no matter what.   Will give you trazodone 50mg  one before bedtime each night for next 2 weeks only, to help you get on sleep schedule. If you continue to have difficulty with "turning off your mind" while trying to sleep, would benefit from evaluation with psychology for what is called cognitive behavioral therapy (CBT).   Please call me in 3 weeks with update on how things are going Will give you a flu shot today.

## 2012-10-18 ENCOUNTER — Telehealth: Payer: Self-pay | Admitting: Pulmonary Disease

## 2012-10-18 NOTE — Telephone Encounter (Signed)
PLEASE SEND THIS TO DR CLANCE AFTER READING. THANKS. [name]

## 2012-10-18 NOTE — Telephone Encounter (Signed)
Noted will forward to [facility] as Nicholos Johns requested

## 2012-10-18 NOTE — Telephone Encounter (Signed)
I am happy she is doing better.  Have her call if she feels things are not slowly improving.

## 2012-10-18 NOTE — Telephone Encounter (Signed)
NOTE: PT STATES THAT NO CALL BACK IS NEEDED UNLESS NURSE OR DR CLANCE HAVE ANY MORE QUESTIONS. THE F/U APPT WAS MADE IN ERROR. PT WAS ONLY TO "CALL" BACK IN 3 WKS WITH UPDATE. NO APPT WAS TO BE MADE. PT IS "FINE WITH THIS" AND APPT HAS BEEN CANCELLED. [name]

## 2012-10-21 ENCOUNTER — Ambulatory Visit: Payer: Medicare Other | Admitting: Pulmonary Disease

## 2013-01-20 ENCOUNTER — Encounter: Payer: Self-pay | Admitting: Internal Medicine

## 2013-01-20 ENCOUNTER — Other Ambulatory Visit (INDEPENDENT_AMBULATORY_CARE_PROVIDER_SITE_OTHER): Payer: Medicare PPO

## 2013-01-20 ENCOUNTER — Ambulatory Visit (INDEPENDENT_AMBULATORY_CARE_PROVIDER_SITE_OTHER): Payer: Medicare PPO | Admitting: Internal Medicine

## 2013-01-20 VITALS — BP 138/88 | HR 71 | Temp 97.1°F | Ht 67.0 in | Wt 193.0 lb

## 2013-01-20 DIAGNOSIS — I10 Essential (primary) hypertension: Secondary | ICD-10-CM

## 2013-01-20 LAB — BASIC METABOLIC PANEL
CO2: 24 mEq/L (ref 19–32)
Glucose, Bld: 92 mg/dL (ref 70–99)
Potassium: 4 mEq/L (ref 3.5–5.1)
Sodium: 139 mEq/L (ref 135–145)

## 2013-01-20 LAB — LIPID PANEL
HDL: 55.5 mg/dL (ref >39.00)
VLDL: 41.4 mg/dL — ABNORMAL HIGH (ref 0.0–40.0)

## 2013-01-20 LAB — CBC
RBC: 4.57 Mil/uL (ref 3.87–5.11)
WBC: 7.6 10*3/uL (ref 4.5–10.5)

## 2013-01-20 LAB — LDL CHOLESTEROL, DIRECT: Direct LDL: 57.2 mg/dL

## 2013-01-20 LAB — HEMOGLOBIN A1C: Hgb A1c MFr Bld: 5.6 % (ref 4.6–6.5)

## 2013-01-20 NOTE — Patient Instructions (Signed)

## 2013-01-20 NOTE — Progress Notes (Signed)
HPI  Pt presents to the clinic today to establish care. She has never seen a PCP. She does follow up with cardiology and pulmonology on a regular basis. She has no concerns today.  Flu: 09/2012 Pneumovax: never Zostavax: never Tetanus: more than 10 years LMP: post menopausal Pap smear: 2008 Mammogram: 2008 Colonoscopy: due  Past Medical History  Diagnosis Date  . Hypertension   . Myocardial infarction   . Allergic rhinitis   . Asthma   . Arthritis   . Emphysema of lung     Current Outpatient Prescriptions  Medication Sig Dispense Refill  . ALPRAZolam (XANAX) 0.25 MG tablet Take 0.25 mg by mouth 2 (two) times daily as needed.       Marland Kitchen aspirin 325 MG tablet Take 325 mg by mouth daily.      . isosorbide mononitrate (IMDUR) 30 MG 24 hr tablet Take 30 mg by mouth daily.        . nitroGLYCERIN (NITROSTAT) 0.4 MG SL tablet Place 0.4 mg under the tongue every 5 (five) minutes as needed.      . ramipril (ALTACE) 5 MG capsule Take 10 mg by mouth daily.        . rosuvastatin (CRESTOR) 10 MG tablet Take 10 mg by mouth daily.      . traZODone (DESYREL) 50 MG tablet Take 1 tablet (50 mg total) by mouth at bedtime.  15 tablet  0   No current facility-administered medications for this visit.    No Known Allergies  Family History  Problem Relation Age of Onset  . Allergies Mother   . Heart disease Mother   . Diabetes Mother   . Hypertension Mother   . Cancer Father     Lung Cancer  . Cancer Other     Family history of Prostate Cancer    History   Social History  . Marital Status: Divorced    Spouse Name: N/A    Number of Children: N/A  . Years of Education: 12   Occupational History  . retired from the school [employer]   Social History Main Topics  . Smoking status: Current Every Day Smoker -- 0.50 packs/day for 50 years    Types: Cigarettes  . Smokeless tobacco: Not on file  . Alcohol Use: No  . Drug Use: No  . Sexually Active: Yes    Birth  Control/ Protection: Post-menopausal   Other Topics Concern  . Not on file   Social History Narrative   Pt is divorced and lives alone.    Regular exercise-no   Caffeine Use-yes    ROS:  Constitutional: Denies fever, malaise, fatigue, headache or abrupt weight changes.  HEENT: Denies eye pain, eye redness, ear pain, ringing in the ears, wax buildup, runny nose, nasal congestion, bloody nose, or sore throat. Respiratory: Denies difficulty breathing, shortness of breath, cough or sputum production.   Cardiovascular: Denies chest pain, chest tightness, palpitations or swelling in the hands or feet.  Gastrointestinal: Denies abdominal pain, bloating, constipation, diarrhea or blood in the stool.  GU: Denies frequency, urgency, pain with urination, blood in urine, odor or discharge. Musculoskeletal: Denies decrease in range of motion, difficulty with gait, muscle pain or joint pain and swelling.  Skin: Denies redness, rashes, lesions or ulcercations.  Neurological: Denies dizziness, difficulty with memory, difficulty with speech or problems with balance and coordination.   No other specific complaints in a complete review of systems (except as listed in HPI  above).  PE:  BP 138/88  Pulse 71  Temp(Src) 97.1 F (36.2 C) (Oral)  Ht 5\' 7"  (1.702 m)  Wt 193 lb (87.544 kg)  BMI 30.22 kg/m2  SpO2 97% Wt Readings from Last 3 Encounters:  01/20/13 193 lb (87.544 kg)  09/24/12 195 lb (88.451 kg)  04/03/12 199 lb 3.2 oz (90.357 kg)    General: Appears her stated age, overweight but well developed, well nourished in NAD. HEENT: Head: normal shape and size; Eyes: sclera white, no icterus, conjunctiva pink, PERRLA and EOMs intact; Ears: Tm's gray and intact, normal light reflex; Nose: mucosa pink and moist, septum midline; Throat/Mouth: Teeth present, mucosa pink and moist, no lesions or ulcerations noted.  Neck: Normal range of motion. Neck supple, trachea midline. No massses, lumps or  thyromegaly present.  Cardiovascular: Normal rate and rhythm. S1,S2 noted.  No murmur, rubs or gallops noted. No JVD or BLE edema. No carotid bruits noted. Pulmonary/Chest: Normal effort and positive vesicular breath sounds. No respiratory distress. No wheezes, rales or ronchi noted.  Abdomen: Soft and nontender. Normal bowel sounds, no bruits noted. No distention or masses noted. Liver, spleen and kidneys non palpable. Musculoskeletal: Normal range of motion. No signs of joint swelling. No difficulty with gait.  Neurological: Alert and oriented. Cranial nerves II-XII intact. Coordination normal. +DTRs bilaterally. Psychiatric: Mood and affect normal. Behavior is normal. Judgment and thought content normal.    Assessment and Plan:  Preventative Health Maintenance:  Will obtain basic screening labs today Start a diet and exercise program Will set up for pap smear, mammogram and colonscopy Pt declines pneumovax, tetanus and zostavax today  Hypertension, well controlled, additional workup required:  Will obtain labwork today Continue current meds  RTC in 6 months for follow up

## 2013-01-29 ENCOUNTER — Encounter: Payer: Self-pay | Admitting: Internal Medicine

## 2013-01-29 ENCOUNTER — Ambulatory Visit (INDEPENDENT_AMBULATORY_CARE_PROVIDER_SITE_OTHER): Payer: Medicare PPO | Admitting: Internal Medicine

## 2013-01-29 ENCOUNTER — Other Ambulatory Visit (HOSPITAL_COMMUNITY)
Admission: RE | Admit: 2013-01-29 | Discharge: 2013-01-29 | Disposition: A | Discharge status: Home / Self Care | Payer: Medicare PPO | Source: Ambulatory Visit | Attending: Internal Medicine | Admitting: Internal Medicine

## 2013-01-29 VITALS — BP 122/82 | HR 82 | Temp 97.9°F | Ht 67.0 in | Wt 191.5 lb

## 2013-01-29 DIAGNOSIS — Z23 Encounter for immunization: Secondary | ICD-10-CM

## 2013-01-29 DIAGNOSIS — Z124 Encounter for screening for malignant neoplasm of cervix: Secondary | ICD-10-CM | POA: Insufficient documentation

## 2013-01-29 NOTE — Progress Notes (Signed)
Subjective:    Patient ID: [name], female    DOB: [birth year], 67 y.o.   MRN: [id number]  HPI  Pt presents to the clinic today for her pap smear. She has no concerns today.  Review of Systems      Past Medical History  Diagnosis Date  . Hypertension   . Myocardial infarction   . Allergic rhinitis   . Asthma   . Arthritis   . Emphysema of lung     Current Outpatient Prescriptions  Medication Sig Dispense Refill  . ALPRAZolam (XANAX) 0.25 MG tablet Take 0.25 mg by mouth 2 (two) times daily as needed.       Marland Kitchen aspirin 325 MG tablet Take 325 mg by mouth daily.      . isosorbide mononitrate (IMDUR) 30 MG 24 hr tablet Take 30 mg by mouth daily.        . nitroGLYCERIN (NITROSTAT) 0.4 MG SL tablet Place 0.4 mg under the tongue every 5 (five) minutes as needed.      . ramipril (ALTACE) 5 MG capsule Take 10 mg by mouth daily.        . rosuvastatin (CRESTOR) 10 MG tablet Take 10 mg by mouth daily.      . traZODone (DESYREL) 50 MG tablet Take 1 tablet (50 mg total) by mouth at bedtime.  15 tablet  0   No current facility-administered medications for this visit.    No Known Allergies  Family History  Problem Relation Age of Onset  . Allergies Mother   . Heart disease Mother   . Diabetes Mother   . Hypertension Mother   . Cancer Father     Lung Cancer  . Cancer Other     Family history of Prostate Cancer    History   Social History  . Marital Status: Divorced    Spouse Name: N/A    Number of Children: N/A  . Years of Education: 12   Occupational History  . retired from the school [employer]   Social History Main Topics  . Smoking status: Current Every Day Smoker -- 0.50 packs/day for 50 years    Types: Cigarettes  . Smokeless tobacco: Not on file  . Alcohol Use: No  . Drug Use: No  . Sexually Active: Yes    Birth Control/ Protection: Post-menopausal   Other Topics Concern  . Not on file   Social History Narrative   Pt is divorced  and lives alone.    Regular exercise-no   Caffeine Use-yes     Constitutional: Denies fever, malaise, fatigue, headache or abrupt weight changes.  GU: Denies urgency, frequency, pain with urination, burning sensation, blood in urine, odor or discharge.  No other specific complaints in a complete review of systems (except as listed in HPI above).  Objective:   Physical Exam  Constitutional:  Alert, oriented x 4, well developed, well nourished in no apparent distress. Cardiovascular: Normal rate and rhythm. S1,S2 noted.  No murmur, rubs or gallops noted. No JVD or BLE edema. No carotid bruits noted. Pulmonary/Chest: Normal effort and positive vesicular breath sounds. No respiratory distress. No wheezes, rales or ronchi noted.  Genitourinary: Normal female anatomy. Uterus midline, anterior and soft. No CMT or discharge noted. Adenexa non palpable. Some discoloration noted at 9 o'clock position of the cervix. Breast with fibrocystic changes.          Assessment & Plan:   Screening for cervical cancer:  Pap  test performed and sent for evaluation  RTC in 1 year or sooner if needed

## 2013-01-29 NOTE — Addendum Note (Signed)
Addended by: Carin Primrose on: 01/29/2013 02:14 PM   Modules accepted: Orders

## 2013-01-29 NOTE — Patient Instructions (Signed)

## 2013-01-31 ENCOUNTER — Telehealth: Payer: Self-pay | Admitting: Internal Medicine

## 2013-01-31 NOTE — Telephone Encounter (Signed)
Patient Information:  Caller Name: Starlette  Phone: [phone]  Patient: [name], [name]  Gender: Female  DOB: [birth year]  Age: 67 Years  PCP: Nicki Reaper  Office Follow Up:  Does the office need to follow up with this patient?: Yes  Instructions For The Office: Refuses appt, requesting abx to be called in.  RN Note:  States cough and nasal congestion started after receiving pneumonia shot.  States she has been wheezing.  Symptoms  Reason For Call & Symptoms: Pnuemonia shot on 01/28/13.  Developed nasal congestion and cough on 01/29/13.  Reviewed Health History In EMR: Yes  Reviewed Medications In EMR: Yes  Reviewed Allergies In EMR: Yes  Reviewed Surgeries / Procedures: Yes  Date of Onset of Symptoms: 01/29/2013  Treatments Tried: OTC Cold Medication  Treatments Tried Worked: No  Any Fever: Yes  Fever Taken: Oral  Fever Time Of Reading: 14:00:00  Fever Last Reading: 99.1  Guideline(s) Used:  Cough  Disposition Per Guideline:   Go to Office Now  Reason For Disposition Reached:   Wheezing is present  Advice Given:  Call Back If:  Difficulty breathing  You become worse.  Coughing Spasms:  Drink warm fluids. Inhale warm mist (Reason: both relax the airway and loosen up the phlegm).  Patient Refused Recommendation:  Patient Requests Prescription  Nurse advised appt today.  Refuses appt and requests abx to be called in.  Nurse again advised pt to be seen.

## 2013-02-03 NOTE — Telephone Encounter (Signed)
Ash, This is a new problem, I suggest and OV. Rene Kocher

## 2013-02-03 NOTE — Telephone Encounter (Signed)
Left message for pt to callback office.  

## 2013-02-04 NOTE — Telephone Encounter (Signed)
Pt informed of NP's advisement. Pt states that she took OTC Coricidin and she feels better now. She will callback if she needs to schedule an appointment for F/U.

## 2013-03-13 ENCOUNTER — Emergency Department (HOSPITAL_COMMUNITY): Payer: Medicare PPO

## 2013-03-13 ENCOUNTER — Encounter (HOSPITAL_COMMUNITY): Payer: Self-pay | Admitting: Cardiology

## 2013-03-13 ENCOUNTER — Observation Stay (HOSPITAL_COMMUNITY)
Admission: EM | Admit: 2013-03-13 | Discharge: 2013-03-14 | Disposition: A | Discharge status: Home / Self Care | Payer: Medicare PPO | Attending: Cardiology | Admitting: Cardiology

## 2013-03-13 DIAGNOSIS — I252 Old myocardial infarction: Secondary | ICD-10-CM | POA: Insufficient documentation

## 2013-03-13 DIAGNOSIS — E7800 Pure hypercholesterolemia, unspecified: Secondary | ICD-10-CM | POA: Insufficient documentation

## 2013-03-13 DIAGNOSIS — R0789 Other chest pain: Principal | ICD-10-CM | POA: Insufficient documentation

## 2013-03-13 DIAGNOSIS — R0602 Shortness of breath: Secondary | ICD-10-CM | POA: Insufficient documentation

## 2013-03-13 DIAGNOSIS — R209 Unspecified disturbances of skin sensation: Secondary | ICD-10-CM | POA: Insufficient documentation

## 2013-03-13 DIAGNOSIS — R079 Chest pain, unspecified: Secondary | ICD-10-CM

## 2013-03-13 DIAGNOSIS — I2510 Atherosclerotic heart disease of native coronary artery without angina pectoris: Secondary | ICD-10-CM | POA: Insufficient documentation

## 2013-03-13 DIAGNOSIS — I10 Essential (primary) hypertension: Secondary | ICD-10-CM | POA: Insufficient documentation

## 2013-03-13 DIAGNOSIS — J438 Other emphysema: Secondary | ICD-10-CM | POA: Insufficient documentation

## 2013-03-13 HISTORY — DX: Angina pectoris, unspecified: I20.9

## 2013-03-13 HISTORY — DX: Systemic lupus erythematosus, unspecified: M32.9

## 2013-03-13 HISTORY — DX: Atherosclerotic heart disease of native coronary artery without angina pectoris: I25.10

## 2013-03-13 HISTORY — DX: Gastro-esophageal reflux disease without esophagitis: K21.9

## 2013-03-13 LAB — PROTIME-INR
INR: 1.02 (ref 0.00–1.49)
Prothrombin Time: 13.3 seconds (ref 11.6–15.2)

## 2013-03-13 LAB — BASIC METABOLIC PANEL
BUN: 12 mg/dL (ref 6–23)
Calcium: 9.1 mg/dL (ref 8.4–10.5)
GFR calc Af Amer: 82 mL/min — ABNORMAL LOW (ref >90)
GFR calc non Af Amer: 70 mL/min — ABNORMAL LOW (ref >90)
Potassium: 3.5 mEq/L (ref 3.5–5.1)

## 2013-03-13 LAB — CBC WITH DIFFERENTIAL/PLATELET
Basophils Relative: 1 % (ref 0–1)
Eosinophils Absolute: 0.1 10*3/uL (ref 0.0–0.7)
Hemoglobin: 14.3 g/dL (ref 12.0–15.0)
MCH: 31.8 pg (ref 26.0–34.0)
MCHC: 34.5 g/dL (ref 30.0–36.0)
Monocytes Relative: 5 % (ref 3–12)
Neutrophils Relative %: 65 % (ref 43–77)
Platelets: 255 10*3/uL (ref 150–400)
RDW: 13 % (ref 11.5–15.5)

## 2013-03-13 LAB — COMPREHENSIVE METABOLIC PANEL
ALT: 41 U/L — ABNORMAL HIGH (ref 0–35)
BUN: 12 mg/dL (ref 6–23)
Calcium: 9 mg/dL (ref 8.4–10.5)
GFR calc Af Amer: >90 mL/min (ref >90)
Glucose, Bld: 90 mg/dL (ref 70–99)
Sodium: 136 mEq/L (ref 135–145)
Total Protein: 6.8 g/dL (ref 6.0–8.3)

## 2013-03-13 LAB — TROPONIN I
Troponin I: <0.3 ng/mL (ref <0.30)
Troponin I: <0.3 ng/mL (ref <0.30)

## 2013-03-13 LAB — CBC
HCT: 40.5 % (ref 36.0–46.0)
MCH: 31.6 pg (ref 26.0–34.0)
MCHC: 34.6 g/dL (ref 30.0–36.0)
RDW: 12.8 % (ref 11.5–15.5)

## 2013-03-13 LAB — POCT I-STAT TROPONIN I: Troponin i, poc: 0.01 ng/mL (ref 0.00–0.08)

## 2013-03-13 LAB — MAGNESIUM: Magnesium: 2.1 mg/dL (ref 1.5–2.5)

## 2013-03-13 MED ORDER — ONDANSETRON HCL 4 MG/2ML IJ SOLN: 4.0000 mg | Freq: Four times a day (QID) | INTRAMUSCULAR | Status: DC | PRN

## 2013-03-13 MED ORDER — NON FORMULARY: 10.0000 mg | Freq: Every day | Status: DC

## 2013-03-13 MED ORDER — ATORVASTATIN CALCIUM 80 MG PO TABS
80.0000 mg | ORAL_TABLET | Freq: Every day | ORAL | Status: DC
  Filled 2013-03-13: qty 1

## 2013-03-13 MED ORDER — ASPIRIN EC 81 MG PO TBEC
81.0000 mg | DELAYED_RELEASE_TABLET | Freq: Every day | ORAL | Status: DC
Start: 2013-03-14 — End: 2013-03-14
  Administered 2013-03-14: 81 mg via ORAL
  Filled 2013-03-13: qty 1

## 2013-03-13 MED ORDER — ROSUVASTATIN CALCIUM 10 MG PO TABS
10.0000 mg | ORAL_TABLET | Freq: Every day | ORAL | Status: DC
  Administered 2013-03-13: 10 mg via ORAL
  Filled 2013-03-13 (×2): qty 1

## 2013-03-13 MED ORDER — ASPIRIN 81 MG PO CHEW
324.0000 mg | CHEWABLE_TABLET | ORAL | Status: AC
  Administered 2013-03-13: 324 mg via ORAL
  Filled 2013-03-13: qty 4

## 2013-03-13 MED ORDER — HEPARIN (PORCINE) IN NACL 100-0.45 UNIT/ML-% IJ SOLN
1150.0000 [IU]/h | INTRAMUSCULAR | Status: DC
  Administered 2013-03-13: 1000 [IU]/h via INTRAVENOUS
  Administered 2013-03-14: 1150 [IU]/h via INTRAVENOUS
  Filled 2013-03-13 (×3): qty 250

## 2013-03-13 MED ORDER — ACETAMINOPHEN 325 MG PO TABS
650.0000 mg | ORAL_TABLET | ORAL | Status: DC | PRN
  Administered 2013-03-14: 650 mg via ORAL
  Filled 2013-03-13 (×2): qty 2

## 2013-03-13 MED ORDER — PANTOPRAZOLE SODIUM 40 MG PO TBEC
40.0000 mg | DELAYED_RELEASE_TABLET | Freq: Every day | ORAL | Status: DC
  Administered 2013-03-14: 40 mg via ORAL
  Filled 2013-03-13: qty 1

## 2013-03-13 MED ORDER — NITROGLYCERIN 2 % TD OINT
0.5000 [in_us] | TOPICAL_OINTMENT | Freq: Four times a day (QID) | TRANSDERMAL | Status: DC
  Administered 2013-03-13 – 2013-03-14 (×3): 0.5 [in_us] via TOPICAL
  Filled 2013-03-13 (×2): qty 30

## 2013-03-13 MED ORDER — HEPARIN BOLUS VIA INFUSION
4000.0000 [IU] | Freq: Once | INTRAVENOUS | Status: AC
  Administered 2013-03-13: 4000 [IU] via INTRAVENOUS
  Filled 2013-03-13: qty 4000

## 2013-03-13 MED ORDER — NITROGLYCERIN 0.4 MG SL SUBL: 0.4000 mg | SUBLINGUAL_TABLET | SUBLINGUAL | Status: DC | PRN

## 2013-03-13 MED ORDER — HEPARIN (PORCINE) IN NACL 100-0.45 UNIT/ML-% IJ SOLN
1000.0000 [IU]/h | INTRAMUSCULAR | Status: DC
  Filled 2013-03-13: qty 250

## 2013-03-13 MED ORDER — METOPROLOL TARTRATE 12.5 MG HALF TABLET
12.5000 mg | ORAL_TABLET | Freq: Two times a day (BID) | ORAL | Status: DC
  Administered 2013-03-13 – 2013-03-14 (×3): 12.5 mg via ORAL
  Filled 2013-03-13 (×4): qty 1

## 2013-03-13 MED ORDER — ALPRAZOLAM 0.25 MG PO TABS
0.2500 mg | ORAL_TABLET | Freq: Two times a day (BID) | ORAL | Status: DC | PRN
  Administered 2013-03-13: 0.25 mg via ORAL
  Filled 2013-03-13: qty 1

## 2013-03-13 MED ORDER — ASPIRIN 300 MG RE SUPP
300.0000 mg | RECTAL | Status: AC
  Filled 2013-03-13: qty 1

## 2013-03-13 MED ORDER — SODIUM CHLORIDE 0.9 % IV SOLN
INTRAVENOUS | Status: DC
  Administered 2013-03-13: 17:00:00 via INTRAVENOUS

## 2013-03-13 NOTE — ED Notes (Signed)
Patient states shes been having chest pain x 2 weeks that acts like a "spasm with tightness and fullness".  "I had a heart attack in 1995 and have a stent".

## 2013-03-13 NOTE — H&P (Signed)
[name] is an 67 y.o. female.   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 States occasionally chest pain increases with movement. Also complains of exertional chest fullness off and on release with rest states last night chest pain was grade 10 over 10 but did not seek any medical attention but as she had recurrent chest pain so decided to come to the ER today. Denies any nausea or vomiting denies any palpitation lightheadedness or syncope. Patient was seen in my office a few months ago due to recurrent chest fullness was advised for a stress test but never followed through.  Past Medical History  Diagnosis Date  . Hypertension   . Myocardial infarction   . Allergic rhinitis   . Asthma   . Arthritis   . Emphysema of lung     Past Surgical History  Procedure Laterality Date  . Coronary stent placement  1995    Family History  Problem Relation Age of Onset  . Allergies Mother   . Heart disease Mother   . Diabetes Mother   . Hypertension Mother   . Cancer Father     Lung Cancer  . Cancer Other     Family history of Prostate Cancer   Social History:  reports that she has been smoking Cigarettes.  She has a 25 pack-year smoking history. She does not have any smokeless tobacco history on file. She reports that she does not drink alcohol or use illicit drugs.  Allergies: No Known Allergies   (Not in a hospital admission)  Results for orders placed during the  hospital encounter of 03/13/13 (from the past 48 hour(s))  CBC     Status: None   Collection Time    03/13/13 10:50 AM      Result Value Range   WBC 7.7  4.0 - 10.5 K/uL   RBC 4.43  3.87 - 5.11 MIL/uL   Hemoglobin 14.0  12.0 - 15.0 g/dL   HCT 44.0  10.2 - 72.5 %   MCV 91.4  78.0 - 100.0 fL   MCH 31.6  26.0 - 34.0 pg   MCHC 34.6  30.0 - 36.0 g/dL   RDW 36.6  44.0 - 34.7 %   Platelets 276  150 - 400 K/uL  BASIC METABOLIC PANEL     Status: Abnormal   Collection Time    03/13/13 10:50 AM      Result Value Range   Sodium 137  135 - 145 mEq/L   Potassium 3.5  3.5 - 5.1 mEq/L   Chloride 106  96 - 112 mEq/L   CO2 21  19 - 32 mEq/L   Glucose, Bld 95  70 - 99 mg/dL   BUN 12  6 - 23 mg/dL   Creatinine, Ser 4.25  0.50 - 1.10 mg/dL   Calcium 9.1  8.4 - 95.6 mg/dL   GFR calc non Af Amer 70 (*) >90 mL/min   GFR calc Af Amer 82 (*) >90 mL/min   Comment:  The eGFR has been calculated     using the CKD EPI equation.     This calculation has not been     validated in all clinical     situations.     eGFR's persistently     <90 mL/min signify     possible Chronic Kidney Disease.  POCT I-STAT TROPONIN I     Status: None   Collection Time    03/13/13 11:01 AM      Result Value Range   Troponin i, poc 0.01  0.00 - 0.08 ng/mL   Comment 3            Comment: Due to the release kinetics of cTnI,     a negative result within the first hours     of the onset of symptoms does not rule out     myocardial infarction with certainty.     If myocardial infarction is still suspected,     repeat the test at appropriate intervals.   Dg Chest 2 View  03/13/2013  *RADIOLOGY REPORT*  Clinical Data: Chest pain, shortness of breath  CHEST - 2 VIEW  Comparison: 06/02/2011  Findings: Lungs are clear. No pleural effusion or pneumothorax.  Cardiomediastinal silhouette is within normal limits.  Mild degenerative changes of the visualized thoracolumbar spine.  IMPRESSION: No evidence of acute  cardiopulmonary disease.   Original Report Authenticated By: Charline Bills, M.D.     Review of Systems  Constitutional: Negative for fever and chills.  HENT: Negative for hearing loss.   Eyes: Negative for blurred vision and double vision.  Respiratory: Negative for cough, hemoptysis and sputum production.   Cardiovascular: Positive for chest pain. Negative for palpitations, orthopnea, claudication and leg swelling.  Gastrointestinal: Negative for nausea, vomiting and abdominal pain.  Genitourinary: Negative for dysuria.  Neurological: Negative for dizziness and headaches.    Blood pressure 129/69, pulse 71, temperature 98.1 F (36.7 C), temperature source Oral, resp. rate 19, SpO2 97.00%. Physical Exam  Constitutional: She is oriented to person, place, and time.  HENT:  Head: Normocephalic and atraumatic.  Eyes: Conjunctivae are normal. Pupils are equal, round, and reactive to light. Left eye exhibits no discharge. No scleral icterus.  Neck: Normal range of motion. Neck supple. No JVD present. No tracheal deviation present. No thyromegaly present.  Cardiovascular: Normal rate and regular rhythm.  Exam reveals friction rub.   No murmur heard. Respiratory: Effort normal and breath sounds normal. No respiratory distress. She has no wheezes. She has no rales.  GI: Soft. Bowel sounds are normal. She exhibits no distension. There is no tenderness. There is no rebound and no guarding.  Musculoskeletal: She exhibits no edema and no tenderness.  Neurological: She is alert and oriented to person, place, and time.     Assessment/Plan Atypical chest pain with some features worrisome for angina rule out MI Coronary artery disease history of non-Q-wave MI in the past status post PTCA stenting to ramus in past Hypertension Hypercholesteremia History of emphysema Tobacco abuse Positive family history of coronary artery disease Morbid obesity Plan As per orders Discussed regarding  lifestyle modification and smoking cessation Schedule for nuclear stress test tomorrow  Robynn Pane 03/13/2013, 11:45 AM

## 2013-03-13 NOTE — Progress Notes (Signed)
Utilization review completed.  

## 2013-03-13 NOTE — Progress Notes (Signed)
ANTICOAGULATION CONSULT NOTE - Initial Consult  Pharmacy Consult for Heparin Indication: chest pain/ACS  No Known Allergies  Patient Measurements:  TBW 90kg IBW 62kg Heparin Dosing Weight: 82kg Vital Signs: Temp: 98.1 F (36.7 C) (04/03 1007) Temp src: Oral (04/03 1007) BP: 148/85 mmHg (04/03 1200) Pulse Rate: 73 (04/03 1200)  Labs:  Recent Labs  03/13/13 1050  HGB 14.0  HCT 40.5  PLT 276  CREATININE 0.84    The CrCl is unknown because both a height and weight (above a minimum accepted value) are required for this calculation.   Medical History: Past Medical History  Diagnosis Date  . Hypertension   . Myocardial infarction   . Allergic rhinitis   . Asthma   . Arthritis   . Emphysema of lung       Assessment: 67yof admitted with CP r/o ACS - with hx of CAD/MI - stent to ramus.  First Tp negative.  Patient is to be stared on IV heparin drip.  CBC stable, renal function stable.   Goal of Therapy:  Heparin level 0.3-0.7 units/ml Monitor platelets by anticoagulation protocol: Yes   Plan:  Heparin bolus 4000 uts IV x1 Heparin drip 1000 uts/hr  Draw heparin level 6hr after start Daily heparin level and CBC  Leota Sauers Pharm.D. CPP, BCPS Clinical Pharmacist [phone] 03/13/2013 1:45 PM

## 2013-03-13 NOTE — ED Notes (Signed)
Pt reports midsternal chest pain that has been intermittent over the past couple of weeks. States this morning it became more intense. Reports some SOB and headache. No n/v. States when she lays down the pain is worse.

## 2013-03-13 NOTE — ED Notes (Signed)
Cardiology at bedside for evaluation.

## 2013-03-13 NOTE — Progress Notes (Signed)
ANTICOAGULATION CONSULT NOTE - Follow Up Consult  Pharmacy Consult for Heparin Indication: chest pain/ACS   Patient Measurements: Height: 5\' 7"  (170.2 cm) Weight: 194 lb 12.8 oz (88.361 kg) IBW/kg (Calculated) : 61.6 Heparin Dosing Weight: 80.4 kg  Vital Signs: Temp: 98.4 F (36.9 C) (04/03 1300) Temp src: Oral (04/03 1300) BP: 145/76 mmHg (04/03 1453) Pulse Rate: 69 (04/03 1453)    Recent Labs  03/13/13 1050 03/13/13 1429 03/13/13 1457 03/13/13 1937 03/13/13 1938  HGB 14.0 14.3  --   --   --   HCT 40.5 41.4  --   --   --   PLT 276 255  --   --   --   APTT  --  31  --   --   --   LABPROT  --  13.3  --   --   --   INR  --  1.02  --   --   --   HEPARINUNFRC  --   --   --   --  0.59  CREATININE 0.84 0.71  --   --   --   TROPONINI  --   --  <0.30 <0.30  --     Estimated Creatinine Clearance: 77.9 ml/min (by C-G formula based on Cr of 0.71).  Medications:  Heparin IV @ 1000 units/hr  Assessment: 67yo F with history of CAD/MI & stent admited with CP r/o ACS being continued in IV heparin. First heparin level therapeutic at 0.59 however there are some discrepancies on [facility] regarding charting boluses and infusion (2nd shift RN unaware of these), so will confirm with another 6-hr level. No bleeding issues reported by RN.  Goal of Therapy:  Heparin level 0.3-0.7 units/ml Monitor platelets by anticoagulation protocol: Yes   Plan:  1) Continue IV heparin at 1000 units/hr 2) 6 hour heparin level to confirm 3) Daily heparin level and CBC 4) Monitor signs/symptoms of bleeding  Benjaman Pott, PharmD, BCPS 03/13/2013   8:55 PM

## 2013-03-13 NOTE — ED Provider Notes (Signed)
History     CSN: [id number]  Arrival date & time 03/13/13  1002   First MD Initiated Contact with Patient 03/13/13 1103      Chief Complaint  Patient presents with  . Chest Pain    (Consider location/radiation/quality/duration/timing/severity/associated sxs/prior treatment) HPI Comments: Patient with a history of MI and Coronary Stents presents to the ED today with a chief complaint of chest pain.  Pain is substernal and does not radiate.  Pain has been intermittent over the past two weeks, but became more intense around 2 AM this morning.  Pain has been constant since 2 AM.  She reports that the pain is associated with some SOB and numbness of her left arm.  She denies dizziness, lightheadedness, palpitations, nausea, vomiting, or syncope.  She feels that the pain becomes worse when lying down.  She took a 325 mg Aspirin tablet this morning, but has not taken anything for the pain.  She does not feel that the Aspirin helped with the pain.  Her Cardiologist is Dr. Sharyn Lull.  She had a nuclear stress test done in June of 2012, which did not show any perfusion defects. She denies prolonged travel or surgeries in the past 4 weeks.  Denies LE edema or erythema.  No history of DVT or PE.  No history of Cancer.  She is currently not on any estrogen containing medication.    The history is provided by the patient.    Past Medical History  Diagnosis Date  . Hypertension   . Myocardial infarction   . Allergic rhinitis   . Asthma   . Arthritis   . Emphysema of lung     Past Surgical History  Procedure Laterality Date  . Coronary stent placement  1995    Family History  Problem Relation Age of Onset  . Allergies Mother   . Heart disease Mother   . Diabetes Mother   . Hypertension Mother   . Cancer Father     Lung Cancer  . Cancer Other     Family history of Prostate Cancer    History  Substance Use Topics  . Smoking status: Current Every Day Smoker -- 0.50 packs/day for 50 years     Types: Cigarettes  . Smokeless tobacco: Not on file  . Alcohol Use: No    OB History   Grav Para Term Preterm Abortions TAB SAB Ect Mult Living                  Review of Systems  Constitutional: Negative for fever and chills.  Respiratory: Positive for shortness of breath. Negative for cough.   Cardiovascular: Positive for chest pain. Negative for leg swelling.  Gastrointestinal: Negative for nausea and vomiting.  Neurological: Positive for numbness. Negative for dizziness, syncope and light-headedness.  All other systems reviewed and are negative.    Allergies  Review of patient's allergies indicates no known allergies.  Home Medications   Current Outpatient Rx  Name  Route  Sig  Dispense  Refill  . ALPRAZolam (XANAX) 0.25 MG tablet   Oral   Take 0.25 mg by mouth 2 (two) times daily as needed.          Marland Kitchen aspirin 325 MG tablet   Oral   Take 325 mg by mouth daily.         . isosorbide mononitrate (IMDUR) 30 MG 24 hr tablet   Oral   Take 30 mg by mouth daily.           Marland Kitchen  nitroGLYCERIN (NITROSTAT) 0.4 MG SL tablet   Sublingual   Place 0.4 mg under the tongue every 5 (five) minutes as needed.         . ramipril (ALTACE) 5 MG capsule   Oral   Take 10 mg by mouth daily.           . rosuvastatin (CRESTOR) 10 MG tablet   Oral   Take 10 mg by mouth daily.         Marland Kitchen EXPIRED: traZODone (DESYREL) 50 MG tablet   Oral   Take 1 tablet (50 mg total) by mouth at bedtime.   15 tablet   0     BP 143/77  Pulse 87  Temp(Src) 98.1 F (36.7 C) (Oral)  Resp 18  SpO2 96%  Physical Exam  Nursing note and vitals reviewed. Constitutional: She appears well-developed and well-nourished. No distress.  HENT:  Head: Normocephalic and atraumatic.  Mouth/Throat: Oropharynx is clear and moist.  Neck: Normal range of motion. Neck supple.  Cardiovascular: Normal rate, regular rhythm, normal heart sounds and intact distal pulses.   Pulmonary/Chest: Effort normal  and breath sounds normal. No respiratory distress. She has no wheezes. She exhibits tenderness.  Abdominal: Soft. She exhibits no distension and no mass. There is no tenderness. There is no rebound and no guarding.  Musculoskeletal: Normal range of motion.  No LE edema or erythema.  Neurological: She is alert.  Skin: Skin is warm and dry. No rash noted. She is not diaphoretic. No erythema.  Psychiatric: She has a normal mood and affect.    ED Course  Procedures (including critical care time)  Labs Reviewed  CBC  BASIC METABOLIC PANEL   Dg Chest 2 View  03/13/2013  *RADIOLOGY REPORT*  Clinical Data: Chest pain, shortness of breath  CHEST - 2 VIEW  Comparison: 06/02/2011  Findings: Lungs are clear. No pleural effusion or pneumothorax.  Cardiomediastinal silhouette is within normal limits.  Mild degenerative changes of the visualized thoracolumbar spine.  IMPRESSION: No evidence of acute cardiopulmonary disease.   Original Report Authenticated By: Charline Bills, M.D.      No diagnosis found.   Date: 03/14/2013  Rate: 84  Rhythm: normal sinus rhythm  QRS Axis: normal  Intervals: normal  ST/T Wave abnormalities: nonspecific T wave changes  Conduction Disutrbances:none  Narrative Interpretation:  Low voltage EKG  Old EKG Reviewed: unchanged  Patient discussed with Dr. Rubin Payor.  11:30 AM Discussed patient with Dr. Sharyn Lull with Cardiology.  He reports that he will come evaluate the patient in the ED.  MDM  Concern for cardiac etiology of Chest Pain due to prior history of MI. Cardiology has been consulted and agreed to admit the patient for additional workup.  No acute abnormalities found on EKG and first round of cardiac enzymes negative. No acute findings on CXR.  Patient admitted for further management and work up.        Pascal Lux Wilmot, PA-C 03/14/13 1256

## 2013-03-14 ENCOUNTER — Observation Stay (HOSPITAL_COMMUNITY): Payer: Medicare PPO

## 2013-03-14 LAB — BASIC METABOLIC PANEL
CO2: 22 mEq/L (ref 19–32)
Chloride: 107 mEq/L (ref 96–112)
Potassium: 3.7 mEq/L (ref 3.5–5.1)
Sodium: 139 mEq/L (ref 135–145)

## 2013-03-14 LAB — LIPID PANEL
Cholesterol: 139 mg/dL (ref 0–200)
HDL: 55 mg/dL (ref >39)
Total CHOL/HDL Ratio: 2.5 RATIO

## 2013-03-14 LAB — CBC
Platelets: 269 10*3/uL (ref 150–400)
RBC: 4.11 MIL/uL (ref 3.87–5.11)
WBC: 8.6 10*3/uL (ref 4.0–10.5)

## 2013-03-14 LAB — HEPARIN LEVEL (UNFRACTIONATED): Heparin Unfractionated: 0.29 IU/mL — ABNORMAL LOW (ref 0.30–0.70)

## 2013-03-14 LAB — TROPONIN I: Troponin I: <0.3 ng/mL (ref <0.30)

## 2013-03-14 MED ORDER — ASPIRIN 81 MG PO TBEC: 81.0000 mg | DELAYED_RELEASE_TABLET | Freq: Every day | ORAL | Status: DC

## 2013-03-14 MED ORDER — REGADENOSON 0.4 MG/5ML IV SOLN
0.4000 mg | Freq: Once | INTRAVENOUS | Status: AC
  Administered 2013-03-14: 0.4 mg via INTRAVENOUS

## 2013-03-14 MED ORDER — TECHNETIUM TC 99M SESTAMIBI GENERIC - CARDIOLITE
30.0000 | Freq: Once | INTRAVENOUS | Status: AC | PRN
  Administered 2013-03-14: 30 via INTRAVENOUS

## 2013-03-14 MED ORDER — PANTOPRAZOLE SODIUM 40 MG PO TBEC: 40.0000 mg | DELAYED_RELEASE_TABLET | Freq: Every day | ORAL | Status: DC

## 2013-03-14 MED ORDER — TECHNETIUM TC 99M SESTAMIBI GENERIC - CARDIOLITE
10.0000 | Freq: Once | INTRAVENOUS | Status: AC | PRN
  Administered 2013-03-14: 10 via INTRAVENOUS

## 2013-03-14 NOTE — Progress Notes (Addendum)
ANTICOAGULATION CONSULT NOTE - Follow Up Consult  Pharmacy Consult for Heparin Indication: chest pain/ACS   Patient Measurements: Height: 5\' 7"  (170.2 cm) Weight: 129 lb 1.6 oz (58.559 kg) IBW/kg (Calculated) : 61.6 Heparin Dosing Weight: 80.4 kg  Vital Signs: Temp: 98 F (36.7 C) (04/04 0700) Temp src: Oral (04/04 0700) BP: 132/74 mmHg (04/04 0700) Pulse Rate: 60 (04/04 0700)    Recent Labs  03/13/13 1050 03/13/13 1429 03/13/13 1457 03/13/13 1937 03/13/13 1938 03/14/13 0136 03/14/13 0137 03/14/13 0859  HGB 14.0 14.3  --   --   --   --  12.8  --   HCT 40.5 41.4  --   --   --   --  37.5  --   PLT 276 255  --   --   --   --  269  --   APTT  --  31  --   --   --   --   --   --   LABPROT  --  13.3  --   --   --   --   --   --   INR  --  1.02  --   --   --   --   --   --   HEPARINUNFRC  --   --   --   --  0.59  --  0.29* 0.37  CREATININE 0.84 0.71  --   --   --   --  0.68  --   TROPONINI  --   --  <0.30 <0.30  --  <0.30  --   --     Estimated Creatinine Clearance: 63.1 ml/min (by C-G formula based on Cr of 0.68).  Medications:  Heparin IV @ 1150 units/hr  Assessment: 67yo F with history of CAD/MI & stent admited with CP r/o ACS being continued in IV heparin.   Heparin level (0.37) is at goal on 1150 units/hr.   Goal of Therapy:  Heparin level 0.3-0.7 units/ml Monitor platelets by anticoagulation protocol: Yes   Plan:  -No heparin changes needed -Daily heparin level and CBC  Harland German, Pharm D 03/14/2013 10:54 AM

## 2013-03-14 NOTE — Discharge Summary (Signed)
  Discharge summary dictated on 03/14/2013 dictation number is [id number]

## 2013-03-14 NOTE — ED Provider Notes (Signed)
Medical screening examination/treatment/procedure(s) were performed by non-physician practitioner and as supervising physician I was immediately available for consultation/collaboration.  Odaliz Mcqueary R. Daulton Harbaugh, MD 03/14/13 1549 

## 2013-03-14 NOTE — Progress Notes (Signed)
ANTICOAGULATION CONSULT NOTE - Follow Up Consult  Pharmacy Consult for Heparin Indication: chest pain/ACS   Patient Measurements: Height: 5\' 7"  (170.2 cm) Weight: 194 lb 12.8 oz (88.361 kg) IBW/kg (Calculated) : 61.6 Heparin Dosing Weight: 80.4 kg  Vital Signs: Temp: 98 F (36.7 C) (04/03 2100) Temp src: Oral (04/03 2100) BP: 165/83 mmHg (04/03 2100) Pulse Rate: 67 (04/03 2100)    Recent Labs  03/13/13 1050 03/13/13 1429 03/13/13 1457 03/13/13 1937 03/13/13 1938 03/14/13 0136 03/14/13 0137  HGB 14.0 14.3  --   --   --   --  12.8  HCT 40.5 41.4  --   --   --   --  37.5  PLT 276 255  --   --   --   --  269  APTT  --  31  --   --   --   --   --   LABPROT  --  13.3  --   --   --   --   --   INR  --  1.02  --   --   --   --   --   HEPARINUNFRC  --   --   --   --  0.59  --  0.29*  CREATININE 0.84 0.71  --   --   --   --  0.68  TROPONINI  --   --  <0.30 <0.30  --  <0.30  --     Estimated Creatinine Clearance: 77.9 ml/min (by C-G formula based on Cr of 0.68).  Medications:  Heparin IV @ 1000 units/hr  Assessment: 67yo F with history of CAD/MI & stent admited with CP r/o ACS being continued in IV heparin.   Heparin level (0.29) is slightly below-goal on 1000 units/hr. No problem with line / infusion and no bleeding per RN.   Goal of Therapy:  Heparin level 0.3-0.7 units/ml Monitor platelets by anticoagulation protocol: Yes   Plan:  1. Increase IV heparin to 1150 units/hr.  2. Heparin level in 6 hours.   Lorre Munroe, PharmD  03/14/2013   4:02 AM

## 2013-03-14 NOTE — Progress Notes (Signed)
Subjective:  Complaint of vague retrosternal chest pain earlier today without any associated symptoms. Cardiac enzymes 3 sets have been negative Objective:  Vital Signs in the last 24 hours: Temp:  [98 F (36.7 C)-98.4 F (36.9 C)] 98 F (36.7 C) (04/04 0700) Pulse Rate:  [60-72] 72 (04/04 1227) Resp:  [18-20] 20 (04/04 0700) BP: (132-178)/(62-83) 178/83 mmHg (04/04 1227) SpO2:  [98 %-99 %] 99 % (04/04 0700) Weight:  [58.559 kg (129 lb 1.6 oz)-88.361 kg (194 lb 12.8 oz)] 58.559 kg (129 lb 1.6 oz) (04/04 0700)  Intake/Output from previous day: 04/03 0701 - 04/04 0700 In: -  Out: 1400 [Urine:1400] Intake/Output from this shift: Total I/O In: 0  Out: 500 [Urine:500]  Physical Exam: Neck: no adenopathy, no carotid bruit, no JVD and supple, symmetrical, trachea midline Lungs: clear to auscultation bilaterally Heart: regular rate and rhythm, S1, S2 normal, no murmur, click, rub or gallop Abdomen: soft, non-tender; bowel sounds normal; no masses,  no organomegaly Extremities: extremities normal, atraumatic, no cyanosis or edema  Lab Results:  Recent Labs  03/13/13 1429 03/14/13 0137  WBC 8.2 8.6  HGB 14.3 12.8  PLT 255 269    Recent Labs  03/13/13 1429 03/14/13 0137  NA 136 139  K 3.7 3.7  CL 105 107  CO2 17* 22  GLUCOSE 90 102*  BUN 12 11  CREATININE 0.71 0.68    Recent Labs  03/13/13 1937 03/14/13 0136  TROPONINI <0.30 <0.30   Hepatic Function Panel  Recent Labs  03/13/13 1429  PROT 6.8  ALBUMIN 3.6  AST 48*  ALT 41*  ALKPHOS 91  BILITOT 0.2*    Recent Labs  03/14/13 0137  CHOL 139   No results found for this basename: PROTIME,  in the last 72 hours  Imaging: Imaging results have been reviewed and Dg Chest 2 View  03/13/2013  *RADIOLOGY REPORT*  Clinical Data: Chest pain, shortness of breath  CHEST - 2 VIEW  Comparison: 06/02/2011  Findings: Lungs are clear. No pleural effusion or pneumothorax.  Cardiomediastinal silhouette is within normal  limits.  Mild degenerative changes of the visualized thoracolumbar spine.  IMPRESSION: No evidence of acute cardiopulmonary disease.   Original Report Authenticated By: Charline Bills, M.D.     Cardiac Studies:  Assessment/Plan:  Status post recurrent chest pain MI ruled out Coronary artery disease history of non-Q-wave MI in the past status post PTCA stenting to ramus in past  Hypertension  Hypercholesteremia  History of emphysema  Tobacco abuse  Positive family history of coronary artery disease  Morbid obesity  Plan Scheduled for nuclear stress test today  LOS: 1 day    [name] N 03/14/2013, 12:40 PM

## 2013-03-15 NOTE — Discharge Summary (Signed)
NAMESUZANA, [name]              ACCOUNT NO.:  [id number]  MEDICAL RECORD NO.:  [id number]  LOCATION:  3W07C                        FACILITY:  [facility]  PHYSICIAN:  Eduardo Osier. Sharyn Lull, M.D. DATE OF BIRTH:  Oct 07, 1946  DATE OF ADMISSION:  03/13/2013 DATE OF DISCHARGE:  03/14/2013                              DISCHARGE SUMMARY   ADMITTING DIAGNOSES: 1. Atypical chest pain with some features worrisome for angina, rule     out myocardial infarction. 2. Coronary artery disease, history of non-Q-wave myocardial     infarction in the past, status post percutaneous transluminal     coronary angioplasty stenting to ramus in the past. 3. Hypertension. 4. Hypercholesteremia. 5. History of emphysema. 6. History of tobacco abuse. 7. Positive family history of coronary artery disease. 8. Morbid obesity.  DISCHARGE DIAGNOSES: 1. Status post atypical chest pain, myocardial infarction ruled out     status post [facility] which is negative for ischemia with     normal left ventricular systolic function. 2. Coronary artery disease, history of non-Q-wave myocardial     infarction in the past status post percutaneous transluminal     coronary angioplasty stenting to ramus. 3. Hypertension. 4. Hypercholesteremia. 5. History of emphysema. 6. History of tobacco abuse. 7. Positive family history of coronary artery disease. 8. Morbid obesity.  MEDICATIONS:  Discharge home medications are: 1. Enteric-coated aspirin 81 mg 1 tablet daily. 2. Protonix 40 mg 1 tablet daily. 3. Xanax 0.25 mg twice daily. 4. Imdur 30 mg 1 tablet daily. 5. Nitrostat 0.4 mg sublingual use as directed. 6. Ramipril 10 mg daily as before. 7. Crestor 10 mg 1 tablet daily.  DIET:  Low salt, low cholesterol.  ACTIVITY:  As tolerated.  CONDITION AT DISCHARGE:  Stable.  Follow up with me in 1 week.  BRIEF HISTORY AND HOSPITAL COURSE:  Ms. [name] is a 67 year old female with past medical history significant for  coronary artery disease, history of non-Q-wave myocardial infarction in the past status post PTCA stenting to ramus in the past, hypertension, hypercholesteremia, history of emphysema/asthma in the past, tobacco abuse, morbid obesity, positive family history of coronary artery disease, came to the ER, complaining of recurrent retrosternal chest pain radiating to back and left arm, numbness off and on for the last 2 to 3 weeks, associated with diaphoresis.  States occasionally chest pain increases with movement. Also, complains of exertional chest fullness off and on, relieves with rest.  States last night, chest pain was grade 10/10, but did not seek any medical attention, but as she had recurrent chest pain, so decided to come to the ER.  The patient denies any nausea, vomiting.  Denies any palpitation, lightheadedness, or syncope.  The patient was seen in my office a few months ago due to recurrent chest fullness and was advised for stress test, but never followed through.  PAST MEDICAL HISTORY:  As above.  PAST SURGICAL HISTORY:  None except for coronary artery stenting in the past.  PHYSICAL EXAMINATION:  GENERAL:  She was alert, awake, and oriented x3. VITAL SIGNS:  Blood pressure was 129/69, pulse was 71.  She was afebrile. HEENT:  Conjunctivae pink. NECK:  Supple.  No JVD.  No bruit. LUNGS:  Clear to auscultation without rhonchi or rales. CARDIOVASCULAR:  S1, S2 was normal.  There was no murmur or gallop. ABDOMEN:  Soft.  Bowel sounds were present.  Obese, nontender. EXTREMITIES:  There is no clubbing, cyanosis, or edema. NEUROLOGIC:  Grossly intact.  LABORATORY DATA:  Three sets of troponin I were negative.  Sodium was 137, potassium 3.5, BUN 12, creatinine 0.84, glucose was 95. Cholesterol total was 139, LDL cholesterol was 45, HDL 55, triglycerides were slightly elevated 194.  Hemoglobin was 14, hematocrit 40.5, white count was 7.7.  IMAGING STUDIES:  Chest x-ray  showed no evidence of acute cardiopulmonary disease.  EKG showed normal sinus rhythm, septal Q-waves with nonspecific T-wave changes.  BRIEF HOSPITAL COURSE:  The patient was admitted to telemetry unit.  MI was ruled out by serial enzymes and EKG.  The patient subsequently underwent Lexiscan Myoview which showed no evidence of reversible ischemia.  The patient did not have any further anginal chest pain during the hospital stay.  The patient has been ambulating in room without any problems.  The patient will be discharged home on above medications and will be followed up in my office in 1 week.  If she continues to have recurrent chest pain, we will refer her to GI for further evaluation.     Eduardo Osier. Sharyn Lull, M.D.     [facility]  D:  03/14/2013  T:  03/15/2013  Job:  [id number]

## 2013-03-19 ENCOUNTER — Telehealth: Payer: Self-pay | Admitting: *Deleted

## 2013-03-19 NOTE — Telephone Encounter (Signed)
Pt has no questions about discharge instructions. Pt states that she went to hospital for sxs of MI and was diagnosed with severe acid reflux. Pt was sent home with Protonix to treat acid reflux and also has picked up prescription at pharmacy. Appointment scheduled for next Thursday 03/27/2013 at 2:30pm.

## 2013-03-19 NOTE — Telephone Encounter (Signed)
Left message for pt to callback office.     Ash, Can you call this lady for a transitional care management call s/p hospital discharge.  Document a phone note that includes:  1- if she has any questions about her discharge insturctions 2-If she was sent home with everything she needs  3- If she has picked up any medications that were prescribed by the hospital  She needs an appoint within 1 week please

## 2013-03-27 ENCOUNTER — Inpatient Hospital Stay: Payer: Medicare PPO | Admitting: Internal Medicine

## 2013-04-20 IMAGING — CR DG CHEST 2V
2 series · 2 of 2 positions shown · non-contrast
Comparison: 06/02/2011

CLINICAL DATA: Chest pain, shortness of breath

CHEST - 2 VIEW

[w chest pa]
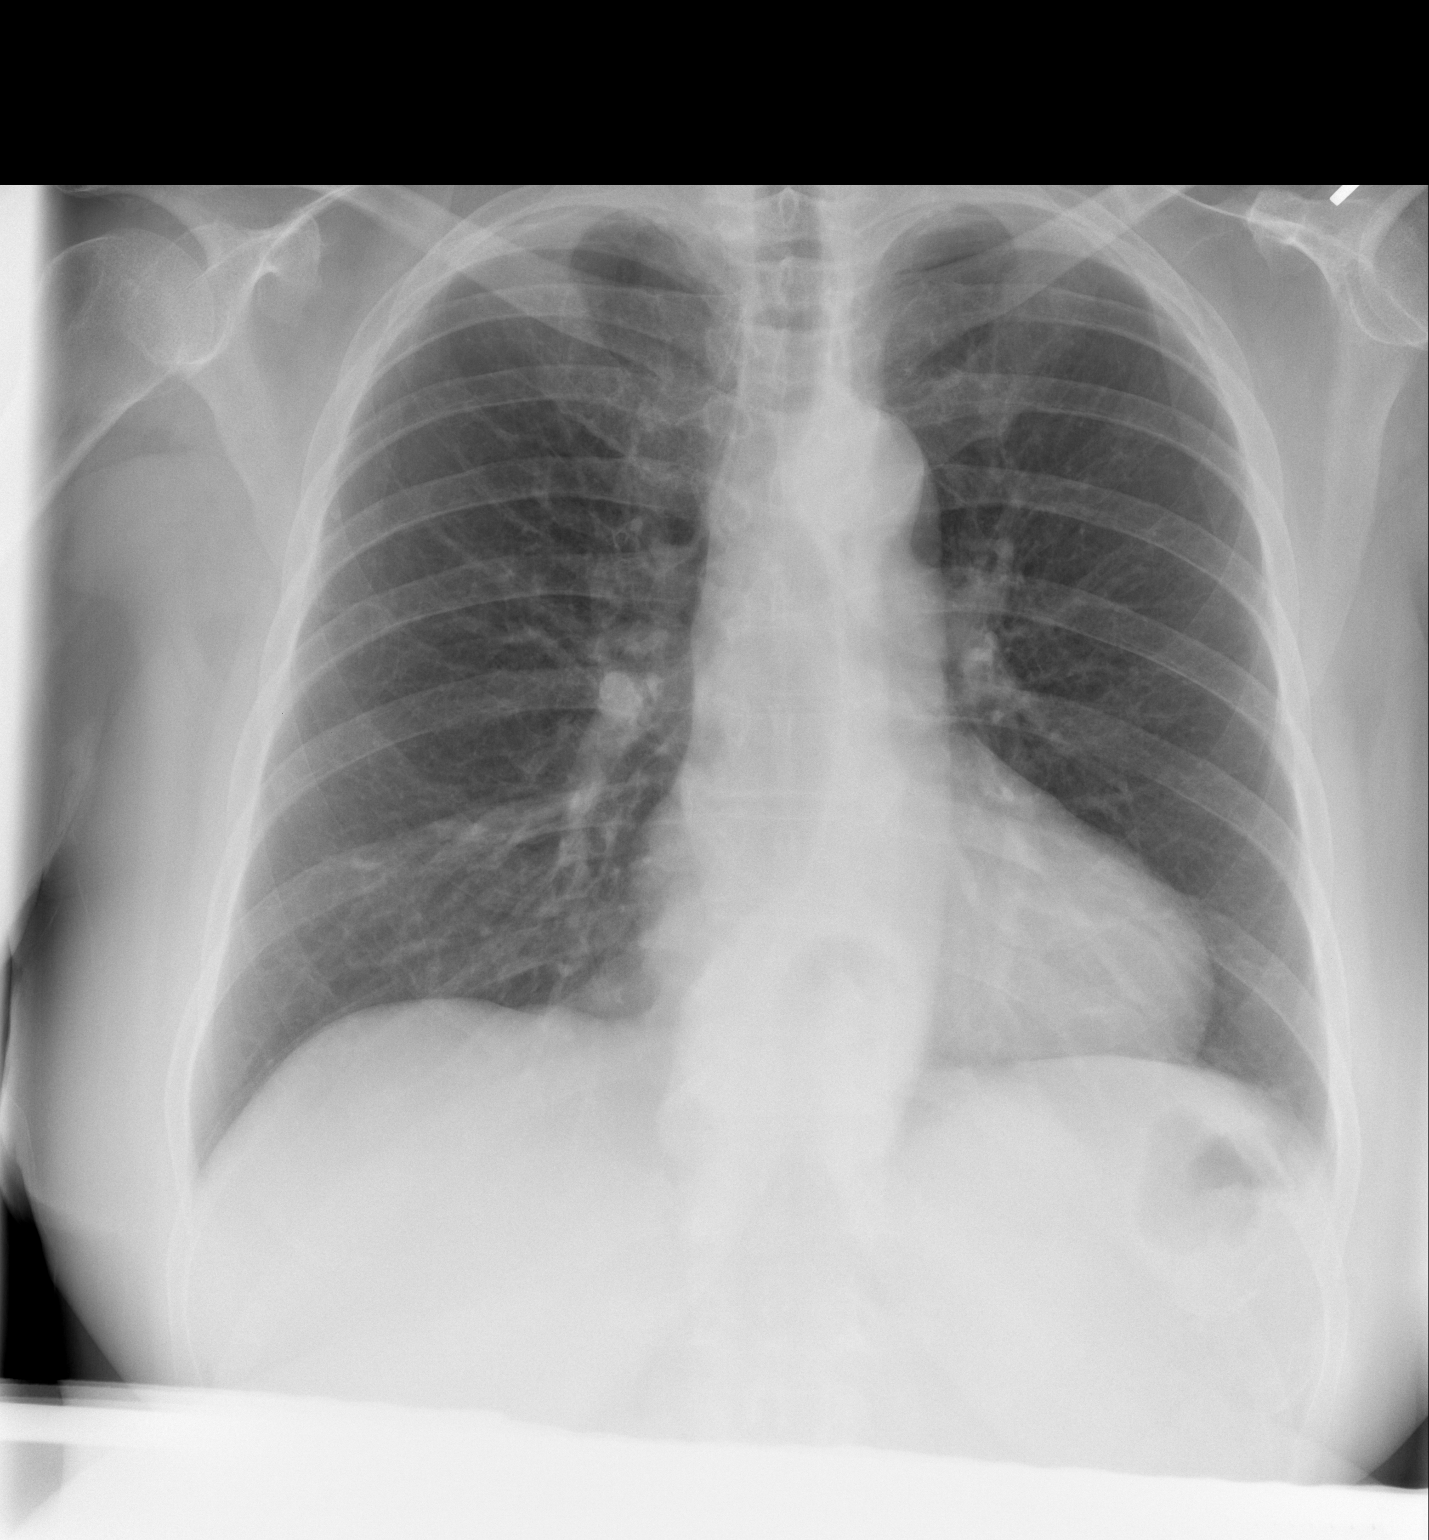

[w chest lat]
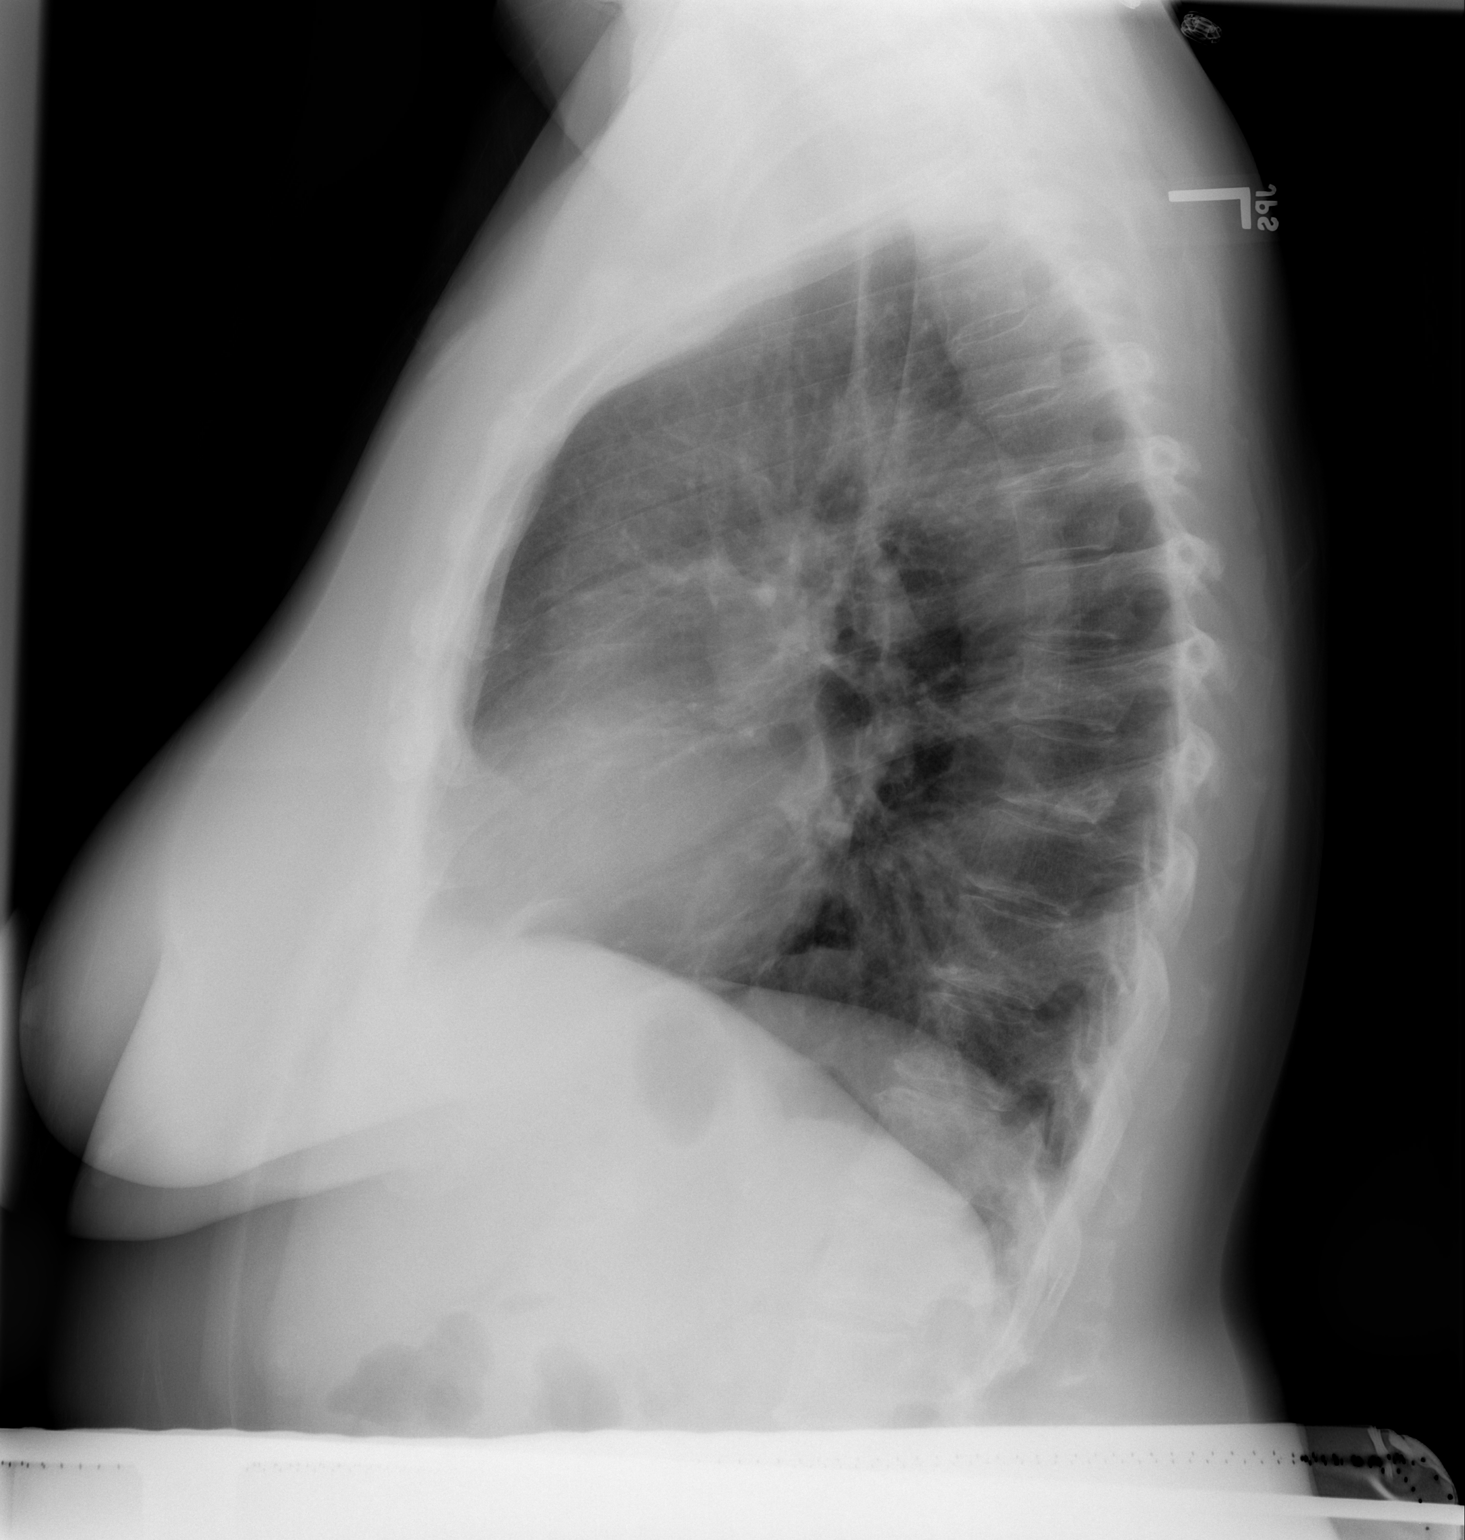

[2 of 2 positions shown; findings below may reference images not displayed]

FINDINGS: Lungs are clear. No pleural effusion or pneumothorax.

Cardiomediastinal silhouette is within normal limits.

Mild degenerative changes of the visualized thoracolumbar spine.
IMPRESSION: No evidence of acute cardiopulmonary disease.

## 2013-04-30 ENCOUNTER — Ambulatory Visit (INDEPENDENT_AMBULATORY_CARE_PROVIDER_SITE_OTHER): Payer: Medicare PPO | Admitting: Internal Medicine

## 2013-04-30 ENCOUNTER — Encounter: Payer: Self-pay | Admitting: Internal Medicine

## 2013-04-30 VITALS — BP 146/76 | HR 86 | Temp 98.2°F | Ht 67.0 in | Wt 194.0 lb

## 2013-04-30 DIAGNOSIS — K5900 Constipation, unspecified: Secondary | ICD-10-CM

## 2013-04-30 DIAGNOSIS — K219 Gastro-esophageal reflux disease without esophagitis: Secondary | ICD-10-CM

## 2013-04-30 NOTE — Progress Notes (Signed)
Subjective:    Patient ID: [name], female    DOB: 1946/06/09, 67 y.o.   MRN: [id number]  HPI  Pt presents to the clinic today for a hospital follow up. She went to the ER on 03/13/13 with severe chest pain. She was admitted and MI was ruled out. She had no episodes of angina while in the hospital. They diagnosed her with severe GERD and put her on Protonix 40 mg daily. Since that time, she has had has no symptoms of reflux or chest pain. She is having issues with constipation. She does not take any laxative or stool softener. She would like referral to GI for further evaluation of her symptoms  Review of Systems      Past Medical History  Diagnosis Date  . Hypertension   . Myocardial infarction   . Allergic rhinitis   . Asthma   . Arthritis   . Emphysema of lung   . Coronary artery disease   . Anginal pain   . GERD (gastroesophageal reflux disease)   . Lupus (systemic lupus erythematosus)     Current Outpatient Prescriptions  Medication Sig Dispense Refill  . ALPRAZolam (XANAX) 0.25 MG tablet Take 0.25 mg by mouth 2 (two) times daily as needed.       Marland Kitchen aspirin EC 81 MG EC tablet Take 1 tablet (81 mg total) by mouth daily.  30 tablet  3  . isosorbide mononitrate (IMDUR) 30 MG 24 hr tablet Take 30 mg by mouth daily.        . nitroGLYCERIN (NITROSTAT) 0.4 MG SL tablet Place 0.4 mg under the tongue every 5 (five) minutes as needed.      . pantoprazole (PROTONIX) 40 MG tablet Take 1 tablet (40 mg total) by mouth daily at 6 (six) AM.  30 tablet  3  . ramipril (ALTACE) 5 MG capsule Take 10 mg by mouth daily.        . rosuvastatin (CRESTOR) 10 MG tablet Take 10 mg by mouth daily.       No current facility-administered medications for this visit.    Allergies  Allergen Reactions  . Lipitor (Atorvastatin) Other (See Comments)    Pt states med makes her feel "loopy"    Family History  Problem Relation Age of Onset  . Allergies Mother   . Heart disease Mother   . Diabetes  Mother   . Hypertension Mother   . Cancer Father     Lung Cancer  . Cancer Other     Family history of Prostate Cancer    History   Social History  . Marital Status: Divorced    Spouse Name: N/A    Number of Children: N/A  . Years of Education: 12   Occupational History  . retired from the school [employer]   Social History Main Topics  . Smoking status: Current Every Day Smoker -- 0.50 packs/day for 50 years    Types: Cigarettes  . Smokeless tobacco: Never Used  . Alcohol Use: Yes     Comment: occassional  . Drug Use: No  . Sexually Active: Yes    Birth Control/ Protection: Post-menopausal   Other Topics Concern  . Not on file   Social History Narrative   Pt is divorced and lives alone.    Regular exercise-no   Caffeine Use-yes     Constitutional: Denies fever, malaise, fatigue, headache or abrupt weight changes.  Respiratory: Denies difficulty breathing, shortness of  breath, cough or sputum production.   Cardiovascular: Denies chest pain, chest tightness, palpitations or swelling in the hands or feet.  Gastrointestinal: Pt reports constipation. Denies abdominal pain, bloating, diarrhea or blood in the stool.   No other specific complaints in a complete review of systems (except as listed in HPI above).  Objective:   Physical Exam   BP 146/76  Pulse 86  Temp(Src) 98.2 F (36.8 C) (Oral)  Ht 5\' 7"  (1.702 m)  Wt 194 lb (87.998 kg)  BMI 30.38 kg/m2  SpO2 97% Wt Readings from Last 3 Encounters:  04/30/13 194 lb (87.998 kg)  03/14/13 129 lb 1.6 oz (58.559 kg)  01/29/13 191 lb 8 oz (86.864 kg)    General: Appears her stated age, well developed, well nourished in NAD. Cardiovascular: Normal rate and rhythm. S1,S2 noted.  No murmur, rubs or gallops noted. No JVD or BLE edema. No carotid bruits noted. Pulmonary/Chest: Normal effort and positive vesicular breath sounds. No respiratory distress. No wheezes, rales or ronchi noted.  Abdomen:  Soft and nontender. Normal bowel sounds, no bruits noted. No distention or masses noted. Liver, spleen and kidneys non palpable.   BMET    Component Value Date/Time   NA 139 03/14/2013 0137   K 3.7 03/14/2013 0137   CL 107 03/14/2013 0137   CO2 22 03/14/2013 0137   GLUCOSE 102* 03/14/2013 0137   BUN 11 03/14/2013 0137   CREATININE 0.68 03/14/2013 0137   CALCIUM 8.8 03/14/2013 0137   GFRNONAA 89* 03/14/2013 0137   GFRAA >90 03/14/2013 0137    Lipid Panel     Component Value Date/Time   CHOL 139 03/14/2013 0137   TRIG 194* 03/14/2013 0137   HDL 55 03/14/2013 0137   CHOLHDL 2.5 03/14/2013 0137   VLDL 39 03/14/2013 0137   LDLCALC 45 03/14/2013 0137    CBC    Component Value Date/Time   WBC 8.6 03/14/2013 0137   RBC 4.11 03/14/2013 0137   HGB 12.8 03/14/2013 0137   HCT 37.5 03/14/2013 0137   PLT 269 03/14/2013 0137   MCV 91.2 03/14/2013 0137   MCH 31.1 03/14/2013 0137   MCHC 34.1 03/14/2013 0137   RDW 12.9 03/14/2013 0137   LYMPHSABS 2.3 03/13/2013 1429   MONOABS 0.4 03/13/2013 1429   EOSABS 0.1 03/13/2013 1429   BASOSABS 0.1 03/13/2013 1429    Hgb A1C Lab Results  Component Value Date   HGBA1C 5.6 01/20/2013        Assessment & Plan:   GERD, well controlled:  Continue protonix Will refer to gi per pt request  Constipation: new:  Try Mirilax OTC

## 2013-04-30 NOTE — Patient Instructions (Signed)
Gastroesophageal Reflux Disease, Adult  Gastroesophageal reflux disease (GERD) happens when acid from your stomach flows up into the esophagus. When acid comes in contact with the esophagus, the acid causes soreness (inflammation) in the esophagus. Over time, GERD may create small holes (ulcers) in the lining of the esophagus.  CAUSES   · Increased body weight. This puts pressure on the stomach, making acid rise from the stomach into the esophagus.  · Smoking. This increases acid production in the stomach.  · Drinking alcohol. This causes decreased pressure in the lower esophageal sphincter (valve or ring of muscle between the esophagus and stomach), allowing acid from the stomach into the esophagus.  · Late evening meals and a full stomach. This increases pressure and acid production in the stomach.  · A malformed lower esophageal sphincter.  Sometimes, no cause is found.  SYMPTOMS   · Burning pain in the lower part of the mid-chest behind the breastbone and in the mid-stomach area. This may occur twice a week or more often.  · Trouble swallowing.  · Sore throat.  · Dry cough.  · Asthma-like symptoms including chest tightness, shortness of breath, or wheezing.  DIAGNOSIS   Your caregiver may be able to diagnose GERD based on your symptoms. In some cases, X-rays and other tests may be done to check for complications or to check the condition of your stomach and esophagus.  TREATMENT   Your caregiver may recommend over-the-counter or prescription medicines to help decrease acid production. Ask your caregiver before starting or adding any new medicines.   HOME CARE INSTRUCTIONS   · Change the factors that you can control. Ask your caregiver for guidance concerning weight loss, quitting smoking, and alcohol consumption.  · Avoid foods and drinks that make your symptoms worse, such as:  · Caffeine or alcoholic drinks.  · Chocolate.  · Peppermint or mint flavorings.  · Garlic and onions.  · Spicy foods.  · Citrus fruits,  such as oranges, lemons, or limes.  · Tomato-based foods such as sauce, chili, salsa, and pizza.  · Fried and fatty foods.  · Avoid lying down for the 3 hours prior to your bedtime or prior to taking a nap.  · Eat small, frequent meals instead of large meals.  · Wear loose-fitting clothing. Do not wear anything tight around your waist that causes pressure on your stomach.  · Raise the head of your bed 6 to 8 inches with wood blocks to help you sleep. Extra pillows will not help.  · Only take over-the-counter or prescription medicines for pain, discomfort, or fever as directed by your caregiver.  · Do not take aspirin, ibuprofen, or other nonsteroidal anti-inflammatory drugs (NSAIDs).  SEEK IMMEDIATE MEDICAL CARE IF:   · You have pain in your arms, neck, jaw, teeth, or back.  · Your pain increases or changes in intensity or duration.  · You develop nausea, vomiting, or sweating (diaphoresis).  · You develop shortness of breath, or you faint.  · Your vomit is green, yellow, black, or looks like coffee grounds or blood.  · Your stool is red, bloody, or black.  These symptoms could be signs of other problems, such as heart disease, gastric bleeding, or esophageal bleeding.  MAKE SURE YOU:   · Understand these instructions.  · Will watch your condition.  · Will get help right away if you are not doing well or get worse.  Document Released: 09/06/2005 Document Revised: 02/19/2012 Document Reviewed: 06/16/2011  ExitCare® Patient   Information ©2014 [employer].  Diet for Gastroesophageal Reflux Disease, Adult  Reflux (acid reflux) is when acid from your stomach flows up into the esophagus. When acid comes in contact with the esophagus, the acid causes irritation and soreness (inflammation) in the esophagus. When reflux happens often or so severely that it causes damage to the esophagus, it is called gastroesophageal reflux disease (GERD). Nutrition therapy can help ease the discomfort of GERD.  FOODS OR DRINKS TO AVOID OR  LIMIT  · Smoking or chewing tobacco. Nicotine is one of the most potent stimulants to acid production in the gastrointestinal tract.  · Caffeinated and decaffeinated coffee and black tea.  · Regular or low-calorie carbonated beverages or energy drinks (caffeine-free carbonated beverages are allowed).    · Strong spices, such as black pepper, white pepper, red pepper, cayenne, curry powder, and chili powder.  · Peppermint or spearmint.  · Chocolate.  · High-fat foods, including meats and fried foods. Extra added fats including oils, butter, salad dressings, and nuts. Limit these to less than 8 tsp per day.  · Fruits and vegetables if they are not tolerated, such as citrus fruits or tomatoes.  · Alcohol.  · Any food that seems to aggravate your condition.  If you have questions regarding your diet, call your caregiver or a registered dietitian.  OTHER THINGS THAT MAY HELP GERD INCLUDE:   · Eating your meals slowly, in a relaxed setting.  · Eating 5 to 6 small meals per day instead of 3 large meals.  · Eliminating food for a period of time if it causes distress.  · Not lying down until 3 hours after eating a meal.  · Keeping the head of your bed raised 6 to 9 inches (15 to 23 cm) by using a foam wedge or blocks under the legs of the bed. Lying flat may make symptoms worse.  · Being physically active. Weight loss may be helpful in reducing reflux in overweight or obese adults.  · Wear loose fitting clothing  EXAMPLE MEAL PLAN  This meal plan is approximately 2,000 calories based on ChooseMyPlate.gov meal planning guidelines.  Breakfast  · ½ cup cooked oatmeal.  · 1 cup strawberries.  · 1 cup low-fat milk.  · 1 oz almonds.  Snack  · 1 cup cucumber slices.  · 6 oz yogurt (made from low-fat or fat-free milk).  Lunch  · 2 slice whole-wheat bread.  · 2½ oz sliced turkey.  · 2 tsp mayonnaise.  · 1 cup blueberries.  · 1 cup snap peas.  Snack  · 6 whole-wheat crackers.  · 1 oz string cheese.  Dinner  · ½ cup brown rice.  · 1  cup mixed veggies.  · 1 tsp olive oil.  · 3 oz grilled fish.  Document Released: 11/27/2005 Document Revised: 02/19/2012 Document Reviewed: 10/13/2011  ExitCare® Patient Information ©2014 [employer].

## 2013-07-10 ENCOUNTER — Ambulatory Visit (INDEPENDENT_AMBULATORY_CARE_PROVIDER_SITE_OTHER): Payer: Medicare PPO | Admitting: Internal Medicine

## 2013-07-10 ENCOUNTER — Encounter: Payer: Self-pay | Admitting: Internal Medicine

## 2013-07-10 ENCOUNTER — Other Ambulatory Visit: Payer: Medicare PPO

## 2013-07-10 VITALS — BP 142/86 | HR 87 | Temp 100.4°F | Wt 192.0 lb

## 2013-07-10 DIAGNOSIS — N390 Urinary tract infection, site not specified: Secondary | ICD-10-CM

## 2013-07-10 DIAGNOSIS — R350 Frequency of micturition: Secondary | ICD-10-CM

## 2013-07-10 LAB — POCT URINALYSIS DIPSTICK
Glucose, UA: NEGATIVE
Ketones, UA: NEGATIVE
Spec Grav, UA: 1.02

## 2013-07-10 MED ORDER — CIPROFLOXACIN HCL 500 MG PO TABS: 500.0000 mg | ORAL_TABLET | Freq: Two times a day (BID) | ORAL | Status: DC

## 2013-07-10 NOTE — Progress Notes (Signed)
HPI  Pt presents to the clinic today with c/o urinary frequency. This started 1 days ago. She is running fevers. She has not taken anything OTC. She denies chills, body aches, nausea or vomiting.  She has had UTI in the past and reports this feel the same.   Review of Systems  Past Medical History  Diagnosis Date  . Hypertension   . Myocardial infarction   . Allergic rhinitis   . Asthma   . Arthritis   . Emphysema of lung   . Coronary artery disease   . Anginal pain   . GERD (gastroesophageal reflux disease)   . Lupus (systemic lupus erythematosus)     Family History  Problem Relation Age of Onset  . Allergies Mother   . Heart disease Mother   . Diabetes Mother   . Hypertension Mother   . Cancer Father     Lung Cancer  . Cancer Other     Family history of Prostate Cancer    History   Social History  . Marital Status: Divorced    Spouse Name: N/A    Number of Children: N/A  . Years of Education: 12   Occupational History  . retired from the school [employer]   Social History Main Topics  . Smoking status: Current Every Day Smoker -- 0.50 packs/day for 50 years    Types: Cigarettes  . Smokeless tobacco: Never Used  . Alcohol Use: Yes     Comment: occassional  . Drug Use: No  . Sexually Active: Yes    Birth Control/ Protection: Post-menopausal   Other Topics Concern  . Not on file   Social History Narrative   Pt is divorced and lives alone.    Regular exercise-no   Caffeine Use-yes    Allergies  Allergen Reactions  . Lipitor (Atorvastatin) Other (See Comments)    Pt states med makes her feel "loopy"    Constitutional: Denies fever, malaise, fatigue, headache or abrupt weight changes.   GU: Pt reports urgency, frequency and pain with urination. Denies burning sensation, blood in urine, odor or discharge. Skin: Denies redness, rashes, lesions or ulcercations.   No other specific complaints in a complete review of systems (except  as listed in HPI above).    Objective:   Physical Exam  BP 142/86  Pulse 87  Temp(Src) 100.4 F (38 C) (Oral)  Wt 192 lb (87.091 kg)  BMI 30.06 kg/m2  SpO2 97% Wt Readings from Last 3 Encounters:  07/10/13 192 lb (87.091 kg)  04/30/13 194 lb (87.998 kg)  03/14/13 129 lb 1.6 oz (58.559 kg)    General: Appears her stated age, well developed, well nourished in NAD. Cardiovascular: Normal rate and rhythm. S1,S2 noted.  No murmur, rubs or gallops noted. No JVD or BLE edema. No carotid bruits noted. Pulmonary/Chest: Normal effort and positive vesicular breath sounds. No respiratory distress. No wheezes, rales or ronchi noted.  Abdomen: Soft and nontender. Normal bowel sounds, no bruits noted. No distention or masses noted. Liver, spleen and kidneys non palpable. Tender to palpation over the bladder area. No CVA tenderness.      Assessment & Plan:   Urinary frequency secondary to UTI  eRx sent if for Cipro 500 mg BID x 5 days eRx sent in for Pyridium 200 mg TID prn Drink plenty of fluids  RTC as needed or if symptoms persist.

## 2013-07-10 NOTE — Patient Instructions (Signed)
Urinary Tract Infection  Urinary tract infections (UTIs) can develop anywhere along your urinary tract. Your urinary tract is your body's drainage system for removing wastes and extra water. Your urinary tract includes two kidneys, two ureters, a bladder, and a urethra. Your kidneys are a pair of bean-shaped organs. Each kidney is about the size of your fist. They are located below your ribs, one on each side of your spine.  CAUSES  Infections are caused by microbes, which are microscopic organisms, including fungi, viruses, and bacteria. These organisms are so small that they can only be seen through a microscope. Bacteria are the microbes that most commonly cause UTIs.  SYMPTOMS   Symptoms of UTIs may vary by age and gender of the patient and by the location of the infection. Symptoms in young women typically include a frequent and intense urge to urinate and a painful, burning feeling in the bladder or urethra during urination. Older women and men are more likely to be tired, shaky, and weak and have muscle aches and abdominal pain. A fever may mean the infection is in your kidneys. Other symptoms of a kidney infection include pain in your back or sides below the ribs, nausea, and vomiting.  DIAGNOSIS  To diagnose a UTI, your caregiver will ask you about your symptoms. Your caregiver also will ask to provide a urine sample. The urine sample will be tested for bacteria and white blood cells. White blood cells are made by your body to help fight infection.  TREATMENT   Typically, UTIs can be treated with medication. Because most UTIs are caused by a bacterial infection, they usually can be treated with the use of antibiotics. The choice of antibiotic and length of treatment depend on your symptoms and the type of bacteria causing your infection.  HOME CARE INSTRUCTIONS   If you were prescribed antibiotics, take them exactly as your caregiver instructs you. Finish the medication even if you feel better after you  have only taken some of the medication.   Drink enough water and fluids to keep your urine clear or pale yellow.   Avoid caffeine, tea, and carbonated beverages. They tend to irritate your bladder.   Empty your bladder often. Avoid holding urine for long periods of time.   Empty your bladder before and after sexual intercourse.   After a bowel movement, women should cleanse from front to back. Use each tissue only once.  SEEK MEDICAL CARE IF:    You have back pain.   You develop a fever.   Your symptoms do not begin to resolve within 3 days.  SEEK IMMEDIATE MEDICAL CARE IF:    You have severe back pain or lower abdominal pain.   You develop chills.   You have nausea or vomiting.   You have continued burning or discomfort with urination.  MAKE SURE YOU:    Understand these instructions.   Will watch your condition.   Will get help right away if you are not doing well or get worse.  Document Released: 09/06/2005 Document Revised: 05/28/2012 Document Reviewed: 01/05/2012  ExitCare Patient Information 2014 [employer].

## 2013-07-10 NOTE — Addendum Note (Signed)
Addended by: Edwena Felty T on: 07/10/2013 03:36 PM   Modules accepted: Orders

## 2013-07-11 LAB — URINE CULTURE
Colony Count: NO GROWTH
Organism ID, Bacteria: NO GROWTH

## 2013-07-18 ENCOUNTER — Telehealth: Payer: Self-pay | Admitting: Internal Medicine

## 2013-07-18 NOTE — Telephone Encounter (Signed)
rec'd records from [facility], Forward 6 page's to [facility]

## 2013-07-25 ENCOUNTER — Encounter: Payer: Self-pay | Admitting: Gastroenterology

## 2013-08-21 ENCOUNTER — Telehealth: Payer: Self-pay | Admitting: *Deleted

## 2013-08-21 ENCOUNTER — Ambulatory Visit: Payer: Medicare PPO | Admitting: Internal Medicine

## 2013-08-21 NOTE — Telephone Encounter (Signed)
ToRoma Schanz Fax: [phone] From: [facility] Date/ Time: 08/21/2013 4:29 AM Taken By: Coralie Keens, CSR Caller: Bonita Quin Facility: not collected Patient: [name], [name] DOB: 01-Sep-1946 Phone: [phone] Reason for Call: See info below Regarding Appointment: Yes Appt Date: 08/21/2013 Appt Time: 1:00:00 AM Provider: Nicki Reaper Reason: Details: family crisis Outcome: Cancelled appointment in EPIC ([facility])

## 2013-09-04 ENCOUNTER — Ambulatory Visit (INDEPENDENT_AMBULATORY_CARE_PROVIDER_SITE_OTHER): Payer: Medicare PPO | Admitting: Internal Medicine

## 2013-09-04 ENCOUNTER — Encounter: Payer: Self-pay | Admitting: Internal Medicine

## 2013-09-04 VITALS — BP 120/84 | HR 71 | Temp 97.5°F | Ht 67.0 in | Wt 195.8 lb

## 2013-09-04 DIAGNOSIS — N390 Urinary tract infection, site not specified: Secondary | ICD-10-CM

## 2013-09-04 DIAGNOSIS — R350 Frequency of micturition: Secondary | ICD-10-CM

## 2013-09-04 DIAGNOSIS — R300 Dysuria: Secondary | ICD-10-CM

## 2013-09-04 DIAGNOSIS — R3915 Urgency of urination: Secondary | ICD-10-CM

## 2013-09-04 DIAGNOSIS — Z23 Encounter for immunization: Secondary | ICD-10-CM

## 2013-09-04 LAB — POCT URINALYSIS DIPSTICK
Glucose, UA: NEGATIVE
Nitrite, UA: NEGATIVE
Urobilinogen, UA: NEGATIVE

## 2013-09-04 NOTE — Progress Notes (Signed)
HPI  Pt presents to the clinic today with c/o urinary frequency. This started 2 days ago. She is running fevers. She has not taken anything OTC. She denies chills, body aches, nausea or vomiting.  She has had UTI in the past and reports this feel the same.   Review of Systems  Past Medical History  Diagnosis Date  . Hypertension   . Myocardial infarction   . Allergic rhinitis   . Asthma   . Arthritis   . Emphysema of lung   . Coronary artery disease   . Anginal pain   . GERD (gastroesophageal reflux disease)   . Lupus (systemic lupus erythematosus)     Family History  Problem Relation Age of Onset  . Allergies Mother   . Heart disease Mother   . Diabetes Mother   . Hypertension Mother   . Cancer Father     Lung Cancer  . Cancer Other     Family history of Prostate Cancer    History   Social History  . Marital Status: Divorced    Spouse Name: N/A    Number of Children: N/A  . Years of Education: 12   Occupational History  . retired from the school [employer]   Social History Main Topics  . Smoking status: Current Every Day Smoker -- 0.50 packs/day for 50 years    Types: Cigarettes  . Smokeless tobacco: Never Used  . Alcohol Use: Yes     Comment: occassional  . Drug Use: No  . Sexual Activity: Yes    Birth Control/ Protection: Post-menopausal   Other Topics Concern  . Not on file   Social History Narrative   Pt is divorced and lives alone.    Regular exercise-no   Caffeine Use-yes    Allergies  Allergen Reactions  . Lipitor [Atorvastatin] Other (See Comments)    Pt states med makes her feel "loopy"    Constitutional: Denies fever, malaise, fatigue, headache or abrupt weight changes.   GU: Pt reports urgency, frequency and pain with urination. Denies burning sensation, blood in urine, odor or discharge. Skin: Denies redness, rashes, lesions or ulcercations.   No other specific complaints in a complete review of systems (except  as listed in HPI above).    Objective:   Physical Exam  BP 120/84  Pulse 71  Temp(Src) 97.5 F (36.4 C) (Oral)  Ht 5\' 7"  (1.702 m)  Wt 195 lb 12 oz (88.792 kg)  BMI 30.65 kg/m2  SpO2 98% Wt Readings from Last 3 Encounters:  09/04/13 195 lb 12 oz (88.792 kg)  07/10/13 192 lb (87.091 kg)  04/30/13 194 lb (87.998 kg)    General: Appears her stated age, well developed, well nourished in NAD. Cardiovascular: Normal rate and rhythm. S1,S2 noted.  No murmur, rubs or gallops noted. No JVD or BLE edema. No carotid bruits noted. Pulmonary/Chest: Normal effort and positive vesicular breath sounds. No respiratory distress. No wheezes, rales or ronchi noted.  Abdomen: Soft and nontender. Normal bowel sounds, no bruits noted. No distention or masses noted. Liver, spleen and kidneys non palpable. Tender to palpation over the bladder area. No CVA tenderness.      Assessment & Plan:   Urinary frequency secondary to cytitis  Urinalysis-trace leuks Decrease your caffeine intake Drink plenty of fluids  RTC as needed or if symptoms persist.

## 2013-09-04 NOTE — Addendum Note (Signed)
Addended by: Darnell Level on: 09/04/2013 02:54 PM   Modules accepted: Orders

## 2013-09-04 NOTE — Patient Instructions (Signed)

## 2013-12-11 DIAGNOSIS — C50919 Malignant neoplasm of unspecified site of unspecified female breast: Secondary | ICD-10-CM

## 2013-12-11 HISTORY — DX: Malignant neoplasm of unspecified site of unspecified female breast: C50.919

## 2013-12-11 HISTORY — PX: MASTECTOMY: SHX3

## 2014-01-02 ENCOUNTER — Ambulatory Visit
Admission: RE | Admit: 2014-01-02 | Discharge: 2014-01-02 | Disposition: A | Discharge status: Home / Self Care | Payer: Medicare PPO | Source: Ambulatory Visit | Attending: Cardiology | Admitting: Cardiology

## 2014-01-02 ENCOUNTER — Other Ambulatory Visit: Payer: Self-pay | Admitting: Cardiology

## 2014-01-02 DIAGNOSIS — W19XXXA Unspecified fall, initial encounter: Secondary | ICD-10-CM

## 2014-01-05 ENCOUNTER — Telehealth: Payer: Self-pay | Admitting: *Deleted

## 2014-01-05 NOTE — Telephone Encounter (Signed)
[facility] Fax: [phone] From: [facility] Date/ Time: 01/04/2014 8:59 PM Taken By: Fransisca Connors, CSR Caller: [place]: not collected Patient: [name], [name] DOB: October 05, 1946 Phone: [phone] Reason for Call: See info below Regarding Appointment: Yes Appt Date: 01/05/2014 Appt Time: 2:30:00 AM Provider: Reason: Cancel Appointment Details: Pt needs to cancel her appt tomorrow because she has to go see her heart dr Outcome: Cancelled appointment in [facility])

## 2014-01-12 ENCOUNTER — Ambulatory Visit: Payer: Medicare PPO | Admitting: Physician Assistant

## 2014-01-19 ENCOUNTER — Encounter: Payer: Self-pay | Admitting: Physician Assistant

## 2014-01-19 ENCOUNTER — Ambulatory Visit (INDEPENDENT_AMBULATORY_CARE_PROVIDER_SITE_OTHER): Payer: Medicare PPO | Admitting: Physician Assistant

## 2014-01-19 VITALS — BP 152/90 | HR 75 | Temp 98.2°F | Ht 67.0 in | Wt 195.2 lb

## 2014-01-19 DIAGNOSIS — Z1231 Encounter for screening mammogram for malignant neoplasm of breast: Secondary | ICD-10-CM

## 2014-01-19 DIAGNOSIS — M25512 Pain in left shoulder: Secondary | ICD-10-CM

## 2014-01-19 DIAGNOSIS — M25569 Pain in unspecified knee: Secondary | ICD-10-CM

## 2014-01-19 DIAGNOSIS — I10 Essential (primary) hypertension: Secondary | ICD-10-CM

## 2014-01-19 DIAGNOSIS — M25519 Pain in unspecified shoulder: Secondary | ICD-10-CM

## 2014-01-19 DIAGNOSIS — N898 Other specified noninflammatory disorders of vagina: Secondary | ICD-10-CM

## 2014-01-19 DIAGNOSIS — Z0000 Encounter for general adult medical examination without abnormal findings: Secondary | ICD-10-CM

## 2014-01-19 DIAGNOSIS — M25561 Pain in right knee: Secondary | ICD-10-CM

## 2014-01-19 NOTE — Progress Notes (Signed)
Pre visit review using our clinic review tool, if applicable. No additional management support is needed unless otherwise documented below in the visit note. 

## 2014-01-19 NOTE — Patient Instructions (Signed)
Shoulder Range of Motion Exercises The shoulder is the most flexible joint in the human body. Because of this it is also the most unstable joint in the body. All ages can develop shoulder problems. Early treatment of problems is necessary for a good outcome. People react to shoulder pain by decreasing the movement of the joint. After a brief period of time, the shoulder can become "frozen". This is an almost complete loss of the ability to move the damaged shoulder. Following injuries your caregivers can give you instructions on exercises to keep your range of motion (ability to move your shoulder freely), or regain it if it has been lost.  [place]: Codman's Exercise or Pendulum Exercise  This exercise may be performed in a prone (face-down) lying position or standing while leaning on a chair with the opposite arm. Its purpose is to relax the muscles in your shoulder and slowly but surely increase the range of motion and to relieve pain.  Lie on your stomach close to the side edge of the bed. Let your weak arm hang over the edge of the bed. Relax your shoulder, arm and hand. Let your shoulder blade relax and drop down.  Slowly and gently swing your arm forward and back. Do not use your neck muscles; relax them. It might be easier to have someone else gently start swinging your arm.  As pain decreases, increase your swing. To start, arm swing should begin at 15 degree angles. In time and as pain lessens, move to 30-45 degree angles. Start with swinging for about 15 seconds, and work towards swinging for 3 to 5 minutes.  This exercise may also be performed in a standing/bent over position.  Stand and hold onto a sturdy chair with your good arm. Bend forward at the waist and bend your knees slightly to help protect your back. Relax your weak arm, let it hang limp. Relax your shoulder blade and let it drop.  Keep your shoulder relaxed and use body  motion to swing your arm in small circles.  Stand up tall and relax.  Repeat motion and change direction of circles.  Start with swinging for about 30 seconds, and work towards swinging for 3 to 5 minutes. STRETCHING EXERCISES:  Lift your arm out in front of you with the elbow bent at 90 degrees. Using your other arm gently pull the elbow forward and across your body.  Bend one arm behind you with the palm facing outward. Using the other arm, hold a towel or rope and reach this arm up above your head, then bend it at the elbow to move your wrist to behind your neck. Grab the free end of the towel with the hand behind your back. Gently pull the towel up with the hand behind your neck, gradually increasing the pull on the hand behind the small of your back. Then, gradually pull down with the hand behind the small of your back. This will pull the hand and arm behind your neck further. Both shoulders will have an increased range of motion with repetition of this exercise. STRENGTHENING EXERCISES:  Standing with your arm at your side and straight out from your shoulder with the elbow bent at 90 degrees, hold onto a small weight and slowly raise your hand so it points straight up in the air. Repeat this five times to begin with, and gradually increase to ten times. Do this four times per day. As you grow  stronger you can gradually increase the weight.  Repeat the above exercise, only this time using an elastic band. Start with your hand up in the air and pull down until your hand is by your side. As you grow stronger, gradually increase the amount you pull by increasing the number or size of the elastic bands. Use the same amount of repetitions.  Standing with your hand at your side and holding onto a weight, gradually lift the hand in front of you until it is over your head. Do the same also with the hand remaining at your side and lift the hand away from your body until it is again over your head.  Repeat this five times to begin with, and gradually increase to ten times. Do this four times per day. As you grow stronger you can gradually increase the weight. Document Released: 08/26/2003 Document Revised: 02/19/2012 Document Reviewed: 11/27/2005 [facility] Patient Information 2014 [place].   Adhesive Capsulitis Sometimes the shoulder becomes stiff and is painful to move. Some people say it feels as if the shoulder is frozen in place. Because of this, the condition is called "frozen shoulder." Its medical name is adhesive capsulitis.  The shoulder joint is made up of strong connective tissue that attaches the ball of the humerus to the shallow shoulder socket. This strong connective tissue is called the joint capsule. This tissue can become stiff and swollen. That is when adhesive capsulitis sets in. CAUSES  It is not always clear just what the cause adhesive capsulitis. Possibilities include:  Injury to the shoulder joint.  Strain. This is a repetitive injury brought about by overuse.  Lack of use. Perhaps your arm or hand was otherwise injured. It might have been in a sling for awhile. Or perhaps you were not using it to avoid pain.  Referred pain. This is a sort of trick the body plays. You feel pain in the shoulder. But, the pain actually comes from an injury somewhere else in the body.  Long-standing health problems. Several diseases can cause adhesive capsulitis. They include diabetes, heart disease, stroke, thyroid problems, rheumatoid arthritis and lung disease.  Being a women older than 48. Anyone can develop adhesive capsulitis but it is most common in women in this age group. SYMPTOMS   Pain.  It occurs when the arm is moved.  Parts of the shoulder might hurt if they are touched.  Pain is worse at night or when resting.  Soreness. It might not be strong enough to be called pain. But, the shoulder aches.  The shoulder does not move freely.  Muscle  spasms.  Trouble sleeping because of shoulder ache or pain. DIAGNOSIS  To decide if you have adhesive capsulitis, your healthcare provider will probably:  Ask about symptoms you have noticed.  Ask about your history of joint pain and anything that might have caused the pain.  Ask about your overall health.  Use hands to feel your shoulder and neck.  Ask you to move your shoulder in specific directions. This may indicate the origin of the pain.  Order imaging tests; pictures of the shoulder. They help pinpoint the source of the problem. An X-ray might be used. For more detail, an MRI is often used. An MRI details the tendons, muscles and ligaments as well as the joint. TREATMENT  Adhesive capsulitis can be treated several ways. Most treatments can be done in a clinic or in your healthcare provider's office. Be sure to discuss the different options with  your caregiver. They include:  Physical therapy. You will work on specific exercises to get your shoulder moving again. The exercises usually involve stretching. A physical therapist (a caregiver with special training) can show you what to do and what not to do. The exercises will need to be done daily.  Medication.  Over-the-counter medicines may relieve pain and inflammation (the body's way of reacting to injury or infection).  Corticosteroids. These are stronger drugs to reduce pain and inflammation. They are given by injection (shots) into the shoulder joint. Frequent treatment is not recommended.  Muscle relaxants. Medication may be prescribed to ease muscle spasms.  Treatment of underlying conditions. This means treating another condition that is causing your shoulder problem. This might be a rotator cuff (tendon) problem  Shoulder manipulation. The shoulder will be moved by your healthcare provider. You would be under general anesthesia (given a drug that puts you to sleep). You would not feel anything. Sometimes the joint will be  injected with salt water (saline) at high pressure to break down internal scarring in the joint capsule.  Surgery. This is rarely needed. It may be suggested in advanced cases after all other treatment has failed. PROGNOSIS  In time, most people recover from adhesive capsulitis. Sometimes, however, the pain goes away but full movement of the shoulder does not return.  HOME CARE INSTRUCTIONS   Take any pain medications recommended by your healthcare provider. Follow the directions carefully.  If you have physical therapy, follow through with the therapist's suggestions. Be sure you understand the exercises you will be doing. You should understand:  How often the exercises should be done.  How many times each exercise should be repeated.  How long they should be done.  What other activities you should do, or not do.  That you should warm up before doing any exercise. Just 5 to 10 minutes will help. Small, gentle movements should get your shoulder ready for more.  Avoid high-demand exercise that involves your shoulder such as throwing. This type of exercise can make pain worse.  Consider using cold packs. Cold may ease swelling and pain. Ask your healthcare provider if a cold pack might help you. If so, get directions on how and when to use them. SEEK MEDICAL CARE IF:   You have any questions about your medications.  Your pain continues to increase. Document Released: 09/24/2009 Document Revised: 02/19/2012 Document Reviewed: 09/24/2009 [facility] Patient Information 2014 [place], [place].   Health Maintenance, Female A healthy lifestyle and preventative care can promote health and wellness.  Maintain regular health, dental, and eye exams.  Eat a healthy diet. Foods like vegetables, fruits, whole grains, low-fat dairy products, and lean protein foods contain the nutrients you need without too many calories. Decrease your intake of foods high in solid fats, added sugars, and salt. Get  information about a proper diet from your caregiver, if necessary.  Regular physical exercise is one of the most important things you can do for your health. Most adults should get at least 150 minutes of moderate-intensity exercise (any activity that increases your heart rate and causes you to sweat) each week. In addition, most adults need muscle-strengthening exercises on 2 or more days a week.   Maintain a healthy weight. The body mass index (BMI) is a screening tool to identify possible weight problems. It provides an estimate of body fat based on height and weight. Your caregiver can help determine your BMI, and can help you achieve or maintain a healthy  weight. For adults 20 years and older:  A BMI below 18.5 is considered underweight.  A BMI of 18.5 to 24.9 is normal.  A BMI of 25 to 29.9 is considered overweight.  A BMI of 30 and above is considered obese.  Maintain normal blood lipids and cholesterol by exercising and minimizing your intake of saturated fat. Eat a balanced diet with plenty of fruits and vegetables. Blood tests for lipids and cholesterol should begin at age 24 and be repeated every 5 years. If your lipid or cholesterol levels are high, you are over 50, or you are a high risk for heart disease, you may need your cholesterol levels checked more frequently.Ongoing high lipid and cholesterol levels should be treated with medicines if diet and exercise are not effective.  If you smoke, find out from your caregiver how to quit. If you do not use tobacco, do not start.  Lung cancer screening is recommended for adults aged 4 80 years who are at high risk for developing lung cancer because of a history of smoking. Yearly low-dose computed tomography (CT) is recommended for people who have at least a 30-pack-year history of smoking and are a current smoker or have quit within the past 15 years. A pack year of smoking is smoking an average of 1 pack of cigarettes a day for 1 year  (for example: 1 pack a day for 30 years or 2 packs a day for 15 years). Yearly screening should continue until the smoker has stopped smoking for at least 15 years. Yearly screening should also be stopped for people who develop a health problem that would prevent them from having lung cancer treatment.  If you are pregnant, do not drink alcohol. If you are breastfeeding, be very cautious about drinking alcohol. If you are not pregnant and choose to drink alcohol, do not exceed 1 drink per day. One drink is considered to be 12 ounces (355 mL) of beer, 5 ounces (148 mL) of wine, or 1.5 ounces (44 mL) of liquor.  Avoid use of street drugs. Do not share needles with anyone. Ask for help if you need support or instructions about stopping the use of drugs.  High blood pressure causes heart disease and increases the risk of stroke. Blood pressure should be checked at least every 1 to 2 years. Ongoing high blood pressure should be treated with medicines, if weight loss and exercise are not effective.  If you are 18 to 68 years old, ask your caregiver if you should take aspirin to prevent strokes.  Diabetes screening involves taking a blood sample to check your fasting blood sugar level. This should be done once every 3 years, after age 38, if you are within normal weight and without risk factors for diabetes. Testing should be considered at a younger age or be carried out more frequently if you are overweight and have at least 1 risk factor for diabetes.  Breast cancer screening is essential preventative care for women. You should practice "breast self-awareness." This means understanding the normal appearance and feel of your breasts and may include breast self-examination. Any changes detected, no matter how small, should be reported to a caregiver. Women in their 59s and 30s should have a clinical breast exam (CBE) by a caregiver as part of a regular health exam every 1 to 3 years. After age 54, women should  have a CBE every year. Starting at age 27, women should consider having a mammogram (breast X-ray) every year. Women  who have a family history of breast cancer should talk to their caregiver about genetic screening. Women at a high risk of breast cancer should talk to their caregiver about having an MRI and a mammogram every year.  Breast cancer gene (BRCA)-related cancer risk assessment is recommended for women who have family members with BRCA-related cancers. BRCA-related cancers include breast, ovarian, tubal, and peritoneal cancers. Having family members with these cancers may be associated with an increased risk for harmful changes (mutations) in the breast cancer genes BRCA1 and BRCA2. Results of the assessment will determine the need for genetic counseling and BRCA1 and BRCA2 testing.  The Pap test is a screening test for cervical cancer. Women should have a Pap test starting at age 53. Between ages 1 and 45, Pap tests should be repeated every 2 years. Beginning at age 75, you should have a Pap test every 3 years as long as the past 3 Pap tests have been normal. If you had a hysterectomy for a problem that was not cancer or a condition that could lead to cancer, then you no longer need Pap tests. If you are between ages 46 and 72, and you have had normal Pap tests going back 10 years, you no longer need Pap tests. If you have had past treatment for cervical cancer or a condition that could lead to cancer, you need Pap tests and screening for cancer for at least 20 years after your treatment. If Pap tests have been discontinued, risk factors (such as a new sexual partner) need to be reassessed to determine if screening should be resumed. Some women have medical problems that increase the chance of getting cervical cancer. In these cases, your caregiver may recommend more frequent screening and Pap tests.  The human papillomavirus (HPV) test is an additional test that may be used for cervical cancer  screening. The HPV test looks for the virus that can cause the cell changes on the cervix. The cells collected during the Pap test can be tested for HPV. The HPV test could be used to screen women aged 53 years and older, and should be used in women of any age who have unclear Pap test results. After the age of 71, women should have HPV testing at the same frequency as a Pap test.  Colorectal cancer can be detected and often prevented. Most routine colorectal cancer screening begins at the age of 41 and continues through age 43. However, your caregiver may recommend screening at an earlier age if you have risk factors for colon cancer. On a yearly basis, your caregiver may provide home test kits to check for hidden blood in the stool. Use of a small camera at the end of a tube, to directly examine the colon (sigmoidoscopy or colonoscopy), can detect the earliest forms of colorectal cancer. Talk to your caregiver about this at age 72, when routine screening begins. Direct examination of the colon should be repeated every 5 to 10 years through age 35, unless early forms of pre-cancerous polyps or small growths are found.  Hepatitis C blood testing is recommended for all people born from [birth year] through 1965 and any individual with known risks for hepatitis C.  Practice safe sex. Use condoms and avoid high-risk sexual practices to reduce the spread of sexually transmitted infections (STIs). Sexually active women aged 64 and younger should be checked for Chlamydia, which is a common sexually transmitted infection. Older women with new or multiple partners should also be tested for Chlamydia.  Testing for other STIs is recommended if you are sexually active and at increased risk.  Osteoporosis is a disease in which the bones lose minerals and strength with aging. This can result in serious bone fractures. The risk of osteoporosis can be identified using a bone density scan. Women ages 2 and over and women at risk  for fractures or osteoporosis should discuss screening with their caregivers. Ask your caregiver whether you should be taking a calcium supplement or vitamin D to reduce the rate of osteoporosis.  Menopause can be associated with physical symptoms and risks. Hormone replacement therapy is available to decrease symptoms and risks. You should talk to your caregiver about whether hormone replacement therapy is right for you.  Use sunscreen. Apply sunscreen liberally and repeatedly throughout the day. You should seek shade when your shadow is shorter than you. Protect yourself by wearing long sleeves, pants, a wide-brimmed hat, and sunglasses year round, whenever you are outdoors.  Notify your caregiver of new moles or changes in moles, especially if there is a change in shape or color. Also notify your caregiver if a mole is larger than the size of a pencil eraser.  Stay current with your immunizations. Document Released: 06/12/2011 Document Revised: 03/24/2013 Document Reviewed: 06/12/2011 [facility] Patient Information 2014 [place].

## 2014-01-19 NOTE — Progress Notes (Signed)
Subjective:    Patient ID: [name], female    DOB: 1946/05/14, 68 y.o.   MRN: [id number]  HPI Comments: Patient is a 68 year old female who presents to the office for a wellness exam. Patient reports history of elevated blood pressure and recently saw her cardiologist who placed her on Metoprolol 25 mg and Norvasc 5 mg. Is to RTO in one month for reevaluation. Reports 4-5 weeks PTA she fell down two steps landing on right knee, has since had xray, no fracture, is still experiencing pain in knee when walking. Is able to bear weight on right extremity, has pain when doing so. No decrease in ROM. Reports prior to fall was experiencing pain in her non dominant left shoulder that does not radiate. States the pain has become progressively worse and is having difficulty raising her left arm. Denies know injury to shoulder, denies hurting in recent fall. States she does sleep on that shoulder at night. Denies chest pain/palpitations, change in bowel/bladder habits, N/V/F, change in appetite, dizzy/lightheadedness, weakness or other concerns at this time. Is scheduled to see eye doctor next week.    Review of Systems  Constitutional: Negative for fever, chills, activity change and appetite change.  HENT: Negative for ear pain and trouble swallowing.   Eyes: Negative for pain and visual disturbance.  Respiratory: Negative for cough and shortness of breath.   Cardiovascular: Negative for chest pain, palpitations and leg swelling.  Gastrointestinal: Negative for nausea, vomiting, abdominal pain, constipation and blood in stool.  Genitourinary: Positive for vaginal discharge (for last week, dark in color, postmenopausal , not sexually active). Negative for hematuria, vaginal bleeding and vaginal pain.  Musculoskeletal:       Right knee pain, left shoulder pain.  Skin: Negative for rash.  Neurological: Negative for dizziness, weakness and light-headedness.  All other systems reviewed and are  negative.   Past Medical History  Diagnosis Date  . Hypertension   . Myocardial infarction   . Allergic rhinitis   . Asthma   . Arthritis   . Emphysema of lung   . Coronary artery disease   . Anginal pain   . GERD (gastroesophageal reflux disease)   . Lupus (systemic lupus erythematosus)        Objective:   Physical Exam  Vitals reviewed. Constitutional: She is oriented to person, place, and time. She appears well-developed and well-nourished. No distress.  HENT:  Head: Normocephalic and atraumatic.  Right Ear: Tympanic membrane, external ear and ear canal normal.  Left Ear: Tympanic membrane, external ear and ear canal normal.  Nose: Nose normal.  Mouth/Throat: Uvula is midline, oropharynx is clear and moist and mucous membranes are normal. She has dentures (full upper and partial lower, remaining dentition intact.). No trismus in the jaw. No uvula swelling. No oropharyngeal exudate.  Eyes: Conjunctivae and EOM are normal. Pupils are equal, round, and reactive to light. Right eye exhibits no discharge. No scleral icterus.  Neck: Trachea normal and normal range of motion. Carotid bruit is not present.  Cardiovascular: Normal rate, regular rhythm, normal heart sounds and intact distal pulses.  Exam reveals no gallop and no friction rub.   No murmur heard. Pulmonary/Chest: Effort normal and breath sounds normal. No respiratory distress. She has no wheezes. She has no rhonchi. She has no rales.  Abdominal: Soft. Normal appearance and bowel sounds are normal. There is no hepatosplenomegaly. There is no tenderness.  Genitourinary:  Deferred to GYN  Musculoskeletal:  Right knee joint  appears stable. FROM, TTP in medial aspect of joint, no edema present. Overlying soft tissue without erythema, ecchymosis or tactile heat. Calf is soft, nontender. Distally NVI  Left shoulder with decreased ROM both active and passive. TTP over acromion, no lateral tenderness noted to deltoid. Distally  NVI.  Remainder of U/LE bilateral with FROM.  Lymphadenopathy:    She has no cervical adenopathy.       Right: No supraclavicular adenopathy present.       Left: No supraclavicular adenopathy present.  Neurological: She is alert and oriented to person, place, and time. She has normal strength. No sensory deficit. She displays a negative Romberg sign. Coordination and gait normal. GCS eye subscore is 4. GCS verbal subscore is 5. GCS motor subscore is 6.  Skin: Skin is warm and dry.  Psychiatric: She has a normal mood and affect. Her speech is normal and behavior is normal.    Lab Results  Component Value Date   WBC 8.6 03/14/2013   HGB 12.8 03/14/2013   HCT 37.5 03/14/2013   PLT 269 03/14/2013   GLUCOSE 102* 03/14/2013   CHOL 139 03/14/2013   TRIG 194* 03/14/2013   HDL 55 03/14/2013   LDLDIRECT 57.2 01/20/2013   LDLCALC 45 03/14/2013   ALT 41* 03/13/2013   AST 48* 03/13/2013   NA 139 03/14/2013   K 3.7 03/14/2013   CL 107 03/14/2013   CREATININE 0.68 03/14/2013   BUN 11 03/14/2013   CO2 22 03/14/2013   TSH 0.977 03/13/2013   INR 1.02 03/13/2013   HGBA1C 5.6 01/20/2013   Filed Vitals:   01/19/14 1259  BP: 152/90  Pulse: 75  Temp: 98.2 F (36.8 C)    ECG reviewed 4/14 NSR, septal infarct, age undetermined Rate 84 bpm.     Assessment & Plan:   Patient has been counseled on age-appropriate routine health concerns for screening and prevention. These are reviewed and up-to-date. Immunizations are up-to-date or declined. Labs ordered and will be reviewed.  HTN: Elevated in office today, patient reports readings are not normally that high, keep follow up appointment with cardiologist.  Knee pain, right Patient reports has knee compression sleeve, will try to wear for next week or two to see if improvement.  Shoulder pain, left No know injury, presents as frozen shoulder, referral to sports medicine  Vaginal discharge Patient not sexually active, postmenopausal with dark colored discharge will refer to  GYN for further evaluation.

## 2014-01-28 ENCOUNTER — Ambulatory Visit: Payer: Medicare PPO | Admitting: Family Medicine

## 2014-01-28 DIAGNOSIS — Z0289 Encounter for other administrative examinations: Secondary | ICD-10-CM

## 2014-02-10 ENCOUNTER — Encounter: Payer: Self-pay | Admitting: Family Medicine

## 2014-02-13 ENCOUNTER — Other Ambulatory Visit: Payer: Self-pay | Admitting: Physician Assistant

## 2014-02-13 DIAGNOSIS — Z1231 Encounter for screening mammogram for malignant neoplasm of breast: Secondary | ICD-10-CM

## 2014-02-27 ENCOUNTER — Ambulatory Visit
Admission: RE | Admit: 2014-02-27 | Discharge: 2014-02-27 | Disposition: A | Discharge status: Home / Self Care | Payer: Medicare PPO | Source: Ambulatory Visit | Attending: Physician Assistant | Admitting: Physician Assistant

## 2014-02-27 DIAGNOSIS — Z1231 Encounter for screening mammogram for malignant neoplasm of breast: Secondary | ICD-10-CM

## 2014-03-04 ENCOUNTER — Other Ambulatory Visit: Payer: Self-pay | Admitting: Physician Assistant

## 2014-03-04 DIAGNOSIS — R928 Other abnormal and inconclusive findings on diagnostic imaging of breast: Secondary | ICD-10-CM

## 2014-03-18 ENCOUNTER — Other Ambulatory Visit: Payer: Self-pay | Admitting: Physician Assistant

## 2014-03-18 ENCOUNTER — Other Ambulatory Visit: Payer: Self-pay

## 2014-03-18 DIAGNOSIS — R928 Other abnormal and inconclusive findings on diagnostic imaging of breast: Secondary | ICD-10-CM

## 2014-03-23 ENCOUNTER — Encounter: Payer: Medicare PPO | Admitting: Family Medicine

## 2014-04-06 ENCOUNTER — Other Ambulatory Visit: Payer: Self-pay | Admitting: Physician Assistant

## 2014-04-06 ENCOUNTER — Ambulatory Visit
Admission: RE | Admit: 2014-04-06 | Discharge: 2014-04-06 | Disposition: A | Discharge status: Home / Self Care | Payer: Medicare PPO | Source: Ambulatory Visit | Attending: Physician Assistant | Admitting: Physician Assistant

## 2014-04-06 DIAGNOSIS — R928 Other abnormal and inconclusive findings on diagnostic imaging of breast: Secondary | ICD-10-CM

## 2014-04-14 ENCOUNTER — Encounter: Payer: Self-pay | Admitting: Internal Medicine

## 2014-04-14 DIAGNOSIS — R920 Mammographic microcalcification found on diagnostic imaging of breast: Secondary | ICD-10-CM | POA: Insufficient documentation

## 2014-04-15 ENCOUNTER — Inpatient Hospital Stay: Admission: RE | Admit: 2014-04-15 | Payer: Medicare PPO | Source: Ambulatory Visit

## 2014-05-08 ENCOUNTER — Inpatient Hospital Stay: Admission: RE | Admit: 2014-05-08 | Payer: Medicare PPO | Source: Ambulatory Visit

## 2014-05-21 ENCOUNTER — Encounter (INDEPENDENT_AMBULATORY_CARE_PROVIDER_SITE_OTHER): Payer: Self-pay

## 2014-05-21 ENCOUNTER — Ambulatory Visit: Admission: RE | Admit: 2014-05-21 | Payer: Medicare PPO | Source: Ambulatory Visit

## 2014-05-21 ENCOUNTER — Ambulatory Visit
Admission: RE | Admit: 2014-05-21 | Discharge: 2014-05-21 | Disposition: A | Discharge status: Home / Self Care | Payer: Medicare PPO | Source: Ambulatory Visit

## 2014-05-21 ENCOUNTER — Other Ambulatory Visit: Payer: Self-pay | Admitting: Physician Assistant

## 2014-05-21 DIAGNOSIS — R928 Other abnormal and inconclusive findings on diagnostic imaging of breast: Secondary | ICD-10-CM

## 2014-05-22 ENCOUNTER — Other Ambulatory Visit: Payer: Self-pay | Admitting: Physician Assistant

## 2014-05-22 DIAGNOSIS — C50919 Malignant neoplasm of unspecified site of unspecified female breast: Secondary | ICD-10-CM

## 2014-05-28 ENCOUNTER — Ambulatory Visit (INDEPENDENT_AMBULATORY_CARE_PROVIDER_SITE_OTHER): Payer: Medicare PPO | Admitting: Surgery

## 2014-05-28 ENCOUNTER — Encounter (INDEPENDENT_AMBULATORY_CARE_PROVIDER_SITE_OTHER): Payer: Self-pay

## 2014-05-28 ENCOUNTER — Encounter (INDEPENDENT_AMBULATORY_CARE_PROVIDER_SITE_OTHER): Payer: Self-pay | Admitting: Surgery

## 2014-05-28 ENCOUNTER — Other Ambulatory Visit (INDEPENDENT_AMBULATORY_CARE_PROVIDER_SITE_OTHER): Payer: Self-pay

## 2014-05-28 VITALS — BP 140/80 | HR 91 | Temp 98.7°F | Resp 16 | Ht 67.0 in | Wt 190.8 lb

## 2014-05-28 DIAGNOSIS — C50411 Malignant neoplasm of upper-outer quadrant of right female breast: Secondary | ICD-10-CM | POA: Insufficient documentation

## 2014-05-28 DIAGNOSIS — D0511 Intraductal carcinoma in situ of right breast: Secondary | ICD-10-CM

## 2014-05-28 DIAGNOSIS — D0590 Unspecified type of carcinoma in situ of unspecified breast: Secondary | ICD-10-CM

## 2014-05-28 NOTE — Progress Notes (Addendum)
Re:   [name] DOB:   29-Dec-1945 MRN:   [id number]  ASSESSMENT AND PLAN: 1.  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  I discussed a multidisciplinary approach to the treatment of breast cancer, which includes medical oncology and radiation oncology.  I discussed the surgical options of lumpectomy vs. mastectomy.  If mastectomy, there is the possibility of reconstruction.   I discussed the options of lymph node biopsy.  The treatment plan depends on the pathologic staging of the tumor and the patient's personal wishes.  The risks of surgery include, but are not limited to, bleeding, infection, the need for further surgery, and nerve injury.  The patient has been given literature on the treatment of breast cancer.  Plan: 1) Card clearance from Dr. Terrence Dupont, 2) MRI tomorrow (05/29/2104), 3) She will almost certainly need a mastectomy on the right because of the size of the microca++.  At this time, she is not interested in breast reconstruction,  4) Because of the size of the microca++, she will need a SLNBx of her right axilla, 5) will need left microca++ excised, which I can probably do with generous lumpectomy.  This area is only ADH [Received cardiac clearance.  Will schedule right mastectomy with SLNBx and left breast lumpectomy (2 wires).  DN 06/26/2014] [The patient was scheduled for surgery 08/03/2014 at [facility].  But she is upset about her options.  She mistakenly thought she had invasive ca, but I had given her a copy of her path report at her office visit.  She is going to [facility] for a second opinion. I think that this is a good idea.   I have left it for her to call us back if she wants [place] to proceed with surgery.  This was a phone conversation that lasted about 15 minutes.  DN 08/02/2014]  2.  ADH of left breast, 2  o'clock  Microca++ - 4.1 x 1.1 x 1.0 cm  3.  HTN 4.  History of MI  Stent 2008  Sees Dr. Barb Merino of card clearance from Dr. Terrence Dupont dated 06/19/2014.  DN 07/29/2014.] 5.  Emphysema 6.  Right knee and left shoulder issues 7.  Smokes - 1/2 ppd   Chief Complaint  Patient presents with  . Breast Cancer   REFERRING PHYSICIAN: Webb Silversmith, NP  HISTORY OF PRESENT ILLNESS: [name] is a 68 y.o. (DOB: [birth year])  AA  female whose primary care physician is BAITY, Rollene Fare, NP and comes to me today for newly diagnosed right breast cancer. Her sister, Otilio Saber, is with her.  She had not had a mammogram in years (1996?).  She has no fam hx of breast cancer.  She is not on hormones.  Her LMP was about 10 years ago.  Mammogram - 04/06/2014 - 1. 9.6 x 5.7 x 2.3 cm group of microcalcifications in the upper-outer quadrant of the right breast. These are suspicious for malignancy.  2. 4.3 x 1.1 x 1.0 cm group of microcalcifications in the upper outer left breast, mildly suspicious for malignancy.  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   Past Medical History  Diagnosis Date  .  Hypertension   . Myocardial infarction   . Allergic rhinitis   . Asthma   . Arthritis   . Emphysema of lung   . Coronary artery disease   . Anginal pain   . GERD (gastroesophageal reflux disease)   . Lupus (systemic lupus erythematosus)      Past Surgical History  Procedure Laterality Date  . Coronary stent placement  1995    Current Outpatient Prescriptions  Medication Sig Dispense Refill  . ALPRAZolam (XANAX) 0.25 MG tablet Take 0.25 mg by mouth 2 (two) times daily as needed.       Marland Kitchen aspirin EC 81 MG EC tablet Take 1 tablet (81 mg total) by mouth daily.  30 tablet  3  . isosorbide mononitrate (IMDUR) 30 MG 24 hr tablet Take 30 mg by mouth daily.        . metoprolol succinate  (TOPROL-XL) 50 MG 24 hr tablet Take 50 mg by mouth daily. Take with or immediately following a meal.      . nitroGLYCERIN (NITROSTAT) 0.4 MG SL tablet Place 0.4 mg under the tongue every 5 (five) minutes as needed.      . pravastatin (PRAVACHOL) 40 MG tablet Take 40 mg by mouth at bedtime.      . ramipril (ALTACE) 5 MG capsule Take 10 mg by mouth daily.         No current facility-administered medications for this visit.      Allergies  Allergen Reactions  . Lipitor [Atorvastatin] Other (See Comments)    Pt states med makes her feel "loopy"    REVIEW OF SYSTEMS: Skin:  No history of rash.  No history of abnormal moles. Infection:  No history of hepatitis or HIV.  No history of MRSA. Neurologic:  No history of stroke.  No history of seizure.  No history of headaches. Cardiac:  History of MI.  Stent placement 2008.  Followed by Dr. Terrence Dupont.  She was admitted in 03/2013 for chest pain, which proved to be non cardiac. Pulmonary:  Smokes 1/2 ppd.  Knows that it is bad for her health.  Endocrine:  No diabetes. No thyroid disease. Gastrointestinal:  No history of stomach disease.  No history of liver disease.  No history of gall bladder disease.  No history of pancreas disease.  Last colonoscopy about 7 years ago.  She had several polyps removed at that time. Urologic:  No history of kidney stones.  No history of bladder infections. Musculoskeletal:  No history of joint or back disease. Hematologic:  No bleeding disorder.  No history of anemia.  Not anticoagulated. Psycho-social:  The patient is oriented.   The patient has no obvious psychologic or social impairment to understanding our conversation and plan.  SOCIAL and FAMILY HISTORY: Her sister, Otilio Saber, is with her. Unmarried. No children. Retired in 2007 from [employer].  PHYSICAL EXAM: BP 140/80  Pulse 91  Temp(Src) 98.7 F (37.1 C)  Resp 16  Ht 5\' 7"  (1.702 m)  Wt 190 lb 12.8 oz (86.546 kg)  BMI 29.88  kg/m2  General: WN older AA F who is alert and generally healthy appearing.  HEENT: Normal. Pupils equal. Neck: Supple. No mass.  No thyroid mass. Lymph Nodes:  No supraclavicular, cervical, or axillary nodes. Lungs: Clear to auscultation and symmetric breath sounds. Heart:  RRR. No murmur or rub. Breasts:  Right - tender at 12 o'clock.  Bruise in lower 1/2 of breast  Left - Bruise around 2 o'clock  Abdomen:  Soft. No mass. No tenderness. No hernia. Normal bowel sounds.  No abdominal scars. Rectal: Not done. Extremities:  Good strength and ROM  in upper and lower extremities. Neurologic:  Grossly intact to motor and sensory function. Psychiatric: Has normal mood and affect. Behavior is normal.   DATA REVIEWED: Notes in Rich, MD,  [facility], [place] [address] [place]  [place], [place]    [place] Phone:  [place]:  [phone]

## 2014-05-29 ENCOUNTER — Ambulatory Visit
Admission: RE | Admit: 2014-05-29 | Discharge: 2014-05-29 | Disposition: A | Discharge status: Home / Self Care | Payer: Medicare PPO | Source: Ambulatory Visit | Attending: Physician Assistant | Admitting: Physician Assistant

## 2014-05-29 DIAGNOSIS — C50919 Malignant neoplasm of unspecified site of unspecified female breast: Secondary | ICD-10-CM

## 2014-05-29 MED ORDER — GADOBENATE DIMEGLUMINE 529 MG/ML IV SOLN
18.0000 mL | Freq: Once | INTRAVENOUS | Status: AC | PRN
  Administered 2014-05-29: 18 mL via INTRAVENOUS

## 2014-06-04 ENCOUNTER — Telehealth (INDEPENDENT_AMBULATORY_CARE_PROVIDER_SITE_OTHER): Payer: Self-pay

## 2014-06-04 NOTE — Telephone Encounter (Deleted)
Spoke with patient yesterday 06/03/14  who stated she had not heard from DR. Harwani office. I Called DR. Harwani office to check status of Cardiac clearance ,I was informed patient had spoke to DR. Harwani  this week and she had told DR. Harwani she would call their office when she was ready to have consult . Called patient , left message for patient to call DR. Harwani ov for cardiac consult , so needs a cardiac clearance before she could move forward with surgery . Advised to  call [facility] if she had further questions

## 2014-06-04 NOTE — Telephone Encounter (Signed)
Spoke with patient yesterday 06/03/14  who stated she had not heard from DR. Harwani office. I Called DR. Harwani office to check status of Cardiac clearance ,I was informed patient had spoke to DR. Harwani  this week and she had told DR. Harwani she would call their office when she was ready to have consult . Called patient , left message for patient to call DR. Harwani ov for cardiac consult , she  needs a cardiac clearance before she could move forward with surgery . Advised her to   call [facility] if she had further questions

## 2014-06-26 NOTE — Addendum Note (Signed)
Addended by: Shann Medal on: 06/26/2014 03:14 PM   Modules accepted: Orders

## 2014-07-06 ENCOUNTER — Telehealth: Payer: Self-pay | Admitting: *Deleted

## 2014-07-06 NOTE — Telephone Encounter (Signed)
Pt called asking for chemo or radiation to shrink her DCIS down. Informed pt that typically chemo would not shrink DCIS and radiation is local treatment after surgery. Discussed difference b/t invasive and in situ dz. By end of conversation pt had general understanding b/t the two and how they can be treated. Pt denies further needs at this time. Encourage pt to call with further needs or concerns. Received verbal understanding. Contact information given.

## 2014-07-20 ENCOUNTER — Other Ambulatory Visit (INDEPENDENT_AMBULATORY_CARE_PROVIDER_SITE_OTHER): Payer: Self-pay | Admitting: Surgery

## 2014-07-20 DIAGNOSIS — D0511 Intraductal carcinoma in situ of right breast: Secondary | ICD-10-CM

## 2014-07-27 ENCOUNTER — Other Ambulatory Visit (INDEPENDENT_AMBULATORY_CARE_PROVIDER_SITE_OTHER): Payer: Self-pay | Admitting: Surgery

## 2014-07-27 DIAGNOSIS — D0511 Intraductal carcinoma in situ of right breast: Secondary | ICD-10-CM

## 2014-07-31 NOTE — Progress Notes (Signed)
Dr. Georgette Dover returned my page, I notified him that pt was not coming for surgery on Monday. He states he will let Dr. Lucia Gaskins know.

## 2014-07-31 NOTE — Progress Notes (Signed)
Called Dr. Pollie Friar office to see if pt had cancelled surgery for 08/03/14. There is a note in EPIC written by a pharmacy tech on 07/30/14 stating that pt was not having the procedure done on 08/03/14. I spoke with Jackelyn Poling, scheduler at Dr. Pollie Friar office and she states that pt has not cancelled with them. I explained to her what I saw in EPIC and she states she will give this info to Dr. Lucia Gaskins and have him call the patient.

## 2014-07-31 NOTE — Progress Notes (Signed)
Called pt for pre-op call and pt adamantly states that she is not having surgery on Monday, that she doesn't know who scheduled her for Monday and she plans to get a second opinion at [facility]. I asked her if she had notified Dr. Pollie Friar office and she stated that she had never seen Dr. Lucia Gaskins. I told her that I could see that she had seen Dr. Lucia Gaskins in June and then she admitted she did see him but he only ordered an MRI and for her to see her cardiologist. She states she never agreed to surgery. Dr. Pollie Friar office is closed this afternoon, I paged the surgeon on call (Dr. Georgette Dover) to notify him that pt is not coming for surgery Monday.

## 2014-08-02 ENCOUNTER — Telehealth (INDEPENDENT_AMBULATORY_CARE_PROVIDER_SITE_OTHER): Payer: Self-pay | Admitting: Surgery

## 2014-08-02 MED ORDER — CHLORHEXIDINE GLUCONATE 4 % EX LIQD
1.0000 "application " | Freq: Once | CUTANEOUS | Status: DC
  Filled 2014-08-02: qty 15

## 2014-08-02 MED ORDER — CEFAZOLIN SODIUM-DEXTROSE 2-3 GM-% IV SOLR: 2.0000 g | INTRAVENOUS | Status: DC

## 2014-08-02 NOTE — Telephone Encounter (Signed)
I saw [name] on 05/28/2014.   Scheduling her surgery was partially delayed by cardiac clearance.  She was scheduled for 08/03/2014 at [facility].  The are series of notes in Epic on 07/31/2014 about people realizing [name] was cancelling surgery - but no one called me.  Anyway. she is upset about her options.  She mistakenly thought she had invasive ca, but I had given her a copy of her path report when she saw me .   I told her that so far, her pathology only showed DCIS, though there is the possibility that she could have invasive cancer on her final path.  She clearly is confused about her diagnosis and there was poor communication about her concerns.  She is going to [facility] for a second opinion.  I think this is a good idea and I told her so.  This will hopefully give her a chance to ask more questions and give her a clearer picture of her disease.    I spent about 15 minutes on the phone with her answering questions.  I have left it for her to call us back if she wants [place] to proceed with surgery.    Alphonsa Overall, MD, [facility] Pager: [phone] Office phone:  [phone]

## 2014-08-03 ENCOUNTER — Ambulatory Visit (HOSPITAL_COMMUNITY): Admission: RE | Admit: 2014-08-03 | Payer: Medicare PPO | Source: Ambulatory Visit | Admitting: Surgery

## 2014-08-03 ENCOUNTER — Encounter (HOSPITAL_COMMUNITY): Admission: RE | Payer: Self-pay | Source: Ambulatory Visit

## 2014-08-03 ENCOUNTER — Ambulatory Visit (HOSPITAL_COMMUNITY): Payer: Medicare PPO

## 2014-08-03 ENCOUNTER — Inpatient Hospital Stay: Admission: RE | Admit: 2014-08-03 | Payer: Medicare PPO | Source: Ambulatory Visit

## 2014-08-03 SURGERY — SIMPLE MASTECTOMY WITH AXILLARY SENTINEL NODE BIOPSY
Anesthesia: General | Site: Breast | Laterality: Bilateral

## 2014-08-07 DIAGNOSIS — D0511 Intraductal carcinoma in situ of right breast: Secondary | ICD-10-CM | POA: Insufficient documentation

## 2014-08-28 DIAGNOSIS — I2510 Atherosclerotic heart disease of native coronary artery without angina pectoris: Secondary | ICD-10-CM | POA: Insufficient documentation

## 2014-08-31 DIAGNOSIS — K449 Diaphragmatic hernia without obstruction or gangrene: Secondary | ICD-10-CM | POA: Insufficient documentation

## 2014-08-31 DIAGNOSIS — L932 Other local lupus erythematosus: Secondary | ICD-10-CM | POA: Insufficient documentation

## 2014-08-31 DIAGNOSIS — F419 Anxiety disorder, unspecified: Secondary | ICD-10-CM | POA: Insufficient documentation

## 2014-08-31 DIAGNOSIS — K219 Gastro-esophageal reflux disease without esophagitis: Secondary | ICD-10-CM | POA: Insufficient documentation

## 2014-09-09 DIAGNOSIS — C50911 Malignant neoplasm of unspecified site of right female breast: Secondary | ICD-10-CM | POA: Insufficient documentation

## 2014-09-28 ENCOUNTER — Ambulatory Visit: Payer: Medicare PPO | Admitting: Internal Medicine

## 2014-09-28 DIAGNOSIS — Z0289 Encounter for other administrative examinations: Secondary | ICD-10-CM

## 2015-02-15 ENCOUNTER — Telehealth: Payer: Self-pay | Admitting: *Deleted

## 2015-02-15 NOTE — Telephone Encounter (Signed)
Received referral from [facility].  Called and left a message for pt's contact Tiffany to return my call so I can schedule a med onc appt w/ her for the pt.

## 2015-02-18 ENCOUNTER — Telehealth: Payer: Self-pay | Admitting: *Deleted

## 2015-02-18 NOTE — Telephone Encounter (Signed)
Called and left a message for the pt to return my call so I can schedule her w/ a med onc.

## 2015-02-25 ENCOUNTER — Telehealth: Payer: Self-pay | Admitting: *Deleted

## 2015-02-25 NOTE — Telephone Encounter (Signed)
Called and left a message for pt to return my call so I can schedule her.  Called Tiffany at referring to make her aware that I cannot get a return call from the pt.

## 2015-06-01 ENCOUNTER — Telehealth: Payer: Self-pay | Admitting: Hematology

## 2015-06-01 NOTE — Telephone Encounter (Signed)
pt cld to sch appt-gave pt sch time & date of appt

## 2015-06-08 ENCOUNTER — Ambulatory Visit: Payer: Medicare PPO | Admitting: Hematology

## 2015-06-08 DIAGNOSIS — R911 Solitary pulmonary nodule: Secondary | ICD-10-CM | POA: Diagnosis not present

## 2015-06-08 DIAGNOSIS — C50911 Malignant neoplasm of unspecified site of right female breast: Secondary | ICD-10-CM | POA: Diagnosis not present

## 2015-06-09 ENCOUNTER — Ambulatory Visit (HOSPITAL_BASED_OUTPATIENT_CLINIC_OR_DEPARTMENT_OTHER): Payer: Medicare PPO

## 2015-06-09 ENCOUNTER — Telehealth: Payer: Self-pay | Admitting: Hematology

## 2015-06-09 ENCOUNTER — Ambulatory Visit (HOSPITAL_BASED_OUTPATIENT_CLINIC_OR_DEPARTMENT_OTHER): Payer: Medicare PPO | Admitting: Hematology

## 2015-06-09 ENCOUNTER — Encounter: Payer: Self-pay | Admitting: Hematology

## 2015-06-09 VITALS — BP 132/74 | HR 70 | Temp 98.4°F | Resp 16 | Ht 67.0 in | Wt 182.1 lb

## 2015-06-09 DIAGNOSIS — Z170 Estrogen receptor positive status [ER+]: Secondary | ICD-10-CM | POA: Diagnosis not present

## 2015-06-09 DIAGNOSIS — D509 Iron deficiency anemia, unspecified: Secondary | ICD-10-CM

## 2015-06-09 DIAGNOSIS — R918 Other nonspecific abnormal finding of lung field: Secondary | ICD-10-CM

## 2015-06-09 DIAGNOSIS — C50411 Malignant neoplasm of upper-outer quadrant of right female breast: Secondary | ICD-10-CM

## 2015-06-09 DIAGNOSIS — D0511 Intraductal carcinoma in situ of right breast: Secondary | ICD-10-CM

## 2015-06-09 DIAGNOSIS — I10 Essential (primary) hypertension: Secondary | ICD-10-CM

## 2015-06-09 LAB — CBC WITH DIFFERENTIAL/PLATELET
BASO%: 1.3 % (ref 0.0–2.0)
Basophils Absolute: 0.1 10*3/uL (ref 0.0–0.1)
EOS%: 2.8 % (ref 0.0–7.0)
Eosinophils Absolute: 0.2 10*3/uL (ref 0.0–0.5)
HEMATOCRIT: 36.9 % (ref 34.8–46.6)
HEMOGLOBIN: 11.7 g/dL (ref 11.6–15.9)
LYMPH#: 2.4 10*3/uL (ref 0.9–3.3)
LYMPH%: 35.9 % (ref 14.0–49.7)
MCH: 27.5 pg (ref 25.1–34.0)
MCHC: 31.7 g/dL (ref 31.5–36.0)
MCV: 86.7 fL (ref 79.5–101.0)
MONO#: 0.5 10*3/uL (ref 0.1–0.9)
MONO%: 7.7 % (ref 0.0–14.0)
NEUT%: 52.3 % (ref 38.4–76.8)
NEUTROS ABS: 3.6 10*3/uL (ref 1.5–6.5)
Platelets: 247 10*3/uL (ref 145–400)
RBC: 4.26 10*6/uL (ref 3.70–5.45)
RDW: 18.3 % — ABNORMAL HIGH (ref 11.2–14.5)
WBC: 6.8 10*3/uL (ref 3.9–10.3)

## 2015-06-09 LAB — COMPREHENSIVE METABOLIC PANEL (CC13)
ALBUMIN: 3.9 g/dL (ref 3.5–5.0)
ALK PHOS: 92 U/L (ref 40–150)
ALT: 21 U/L (ref 0–55)
AST: 24 U/L (ref 5–34)
Anion Gap: 9 mEq/L (ref 3–11)
BILIRUBIN TOTAL: 0.38 mg/dL (ref 0.20–1.20)
BUN: 14.9 mg/dL (ref 7.0–26.0)
CHLORIDE: 109 meq/L (ref 98–109)
CO2: 23 meq/L (ref 22–29)
Calcium: 9.1 mg/dL (ref 8.4–10.4)
Creatinine: 1.3 mg/dL — ABNORMAL HIGH (ref 0.6–1.1)
EGFR: 51 mL/min/{1.73_m2} — AB (ref >90)
Glucose: 80 mg/dl (ref 70–140)
POTASSIUM: 4.5 meq/L (ref 3.5–5.1)
Sodium: 141 mEq/L (ref 136–145)
TOTAL PROTEIN: 6.9 g/dL (ref 6.4–8.3)

## 2015-06-09 NOTE — Telephone Encounter (Signed)
Pt confirmed labs/ov per 06/29 POF, gave pt AVS and Calendar... KJ °

## 2015-06-09 NOTE — Progress Notes (Signed)
[place] NOTE  Patient Care Team: Jearld Fenton, NP as PCP - General (Internal Medicine) Charolette Forward, MD as Consulting Physician (Cardiology) Danice Goltz Sorscher (Hematology and Oncology)  CHIEF COMPLAINTS/PURPOSE OF CONSULTATION:  Follow up right breast cancer  Oncology History   Breast cancer of upper-outer quadrant of right female breast   Staging form: Breast, AJCC 7th Edition     Pathologic stage from 09/01/2014: Stage IA (T1a(m), N0, cM0) - Unsigned       Breast cancer of upper-outer quadrant of right female breast   05/21/2014 Initial Biopsy Right breast biopsy showed grade 2 DCIS with papillary features, ER/PR strongly positive.   05/21/2014 Receptors her2 ER 93%+, PR 100%   05/21/2014 Initial Diagnosis Breast cancer of upper-outer quadrant of right female breast   09/01/2014 Surgery Right mastectomy with sentinel lymph node biopsy, surgical margins were negative. Left lumpectomy showed fibrocystic changes.    10/11/2014 -  Anti-estrogen oral therapy Exemestane 25 mg once daily    HISTORY OF PRESENTING ILLNESS:  [name] 69 y.o. female with past medical history of stage I right breast cancer, presents to our clinic to transfer her oncology care to our cancer center.   The cancer was detected by screening mammogram in March 2015. The cancer was not palpable prior to diagnosis. She underwent initial core needle biopsy of the right breast mass, which showed grade 2 DCIS, ER PR positive. She initially was seen by surgeon Dr. Lucia Gaskins in [place], then she decided to transfer her care to [facility]. She underwent right breast mastectomy and sentinel lymph node, which showed 2 small focus of stage IA invasive ductal carcinoma, left breast lumpectomy showed benign fibrocystic changes. She did not have reconstruction. She subsequently started antiestrogen oral therapy with exemestane in November 2015. She has been under Dr.  Johnny Bridge care at [facility] in [place]. She lives in [place], and I would like to transfer her care to our cancer center which is close to home.  She is doing well overall. She has had hot flush at night for many years, which got slightly worse after she started exemestane, it wakes her up at night, but overall still manageable and tolerable. No joint or other complains. She has mild fatigue, appetite is moderate. Wight stable.   I reviewed her outside records extensively and collaborated the history with the patient.  SUMMARY OF ONCOLOGIC HISTORY: Oncology History   Breast cancer of upper-outer quadrant of right female breast   Staging form: Breast, AJCC 7th Edition     Pathologic stage from 09/01/2014: Stage IA (T1a(m), N0, cM0) - Unsigned       Breast cancer of upper-outer quadrant of right female breast   05/21/2014 Initial Biopsy Right breast biopsy showed grade 2 DCIS with papillary features, ER/PR strongly positive.   05/21/2014 Receptors her2 ER 93%+, PR 100%   05/21/2014 Initial Diagnosis Breast cancer of upper-outer quadrant of right female breast   09/01/2014 Surgery Right mastectomy with sentinel lymph node biopsy, surgical margins were negative. Left lumpectomy showed fibrocystic changes.    10/11/2014 -  Anti-estrogen oral therapy Exemestane 25 mg once daily    In terms of breast cancer risk profile:  She menarched at early age of 76 and went to menopause at age 17 She had none pregnancy.  She received birth control pills for approximately 10 years.  She was never exposed to fertility medications or hormone replacement therapy.  She has family history of Breast/GYN/GI  cancer  INTERVAL HISTORY: Currently, she is healing well after surgery apart from mild discomfort. She has no concern for local infection  MEDICAL HISTORY:  Past Medical History  Diagnosis Date  . Hypertension   . Myocardial infarction   . Allergic rhinitis   . Asthma   . Arthritis   . Emphysema  of lung   . Coronary artery disease   . Anginal pain   . GERD (gastroesophageal reflux disease)   . Lupus (systemic lupus erythematosus)     SURGICAL HISTORY: Past Surgical History  Procedure Laterality Date  . Coronary stent placement  1995    SOCIAL HISTORY: History   Social History  . Marital Status: Divorced    Spouse Name: N/A  . Number of Children: N/A  . Years of Education: 12   Occupational History  . retired from the school Occupational psychologist   Social History Main Topics  . Smoking status: Former Smoker -- 0.50 packs/day for 50 years    Types: Cigarettes    Quit date: 04/10/2014  . Smokeless tobacco: Never Used  . Alcohol Use: Yes     Comment: occassional  . Drug Use: No  . Sexual Activity: Yes    Birth Control/ Protection: Post-menopausal   Other Topics Concern  . Not on file   Social History Narrative   Pt is divorced and lives alone.    Regular exercise-no   Caffeine Use-yes    FAMILY HISTORY: Family History  Problem Relation Age of Onset  . Allergies Mother   . Heart disease Mother   . Diabetes Mother   . Hypertension Mother   . Cancer Father     Lung Cancer  . Cancer Sister 35    lung cancer   . Cancer Maternal Aunt 78    colon cancer   . Cancer Maternal Aunt 90    uterine cancer     ALLERGIES:  is allergic to lipitor.  MEDICATIONS:  Current Outpatient Prescriptions  Medication Sig Dispense Refill  . ALPRAZolam (XANAX) 0.25 MG tablet Take 0.25 mg by mouth 2 (two) times daily as needed.     Marland Kitchen amLODipine (NORVASC) 5 MG tablet Take 5 mg by mouth daily.    Marland Kitchen aspirin EC 81 MG EC tablet Take 1 tablet (81 mg total) by mouth daily. 30 tablet 3  . exemestane (AROMASIN) 25 MG tablet Take 1 tablet by mouth daily.  2  . isosorbide mononitrate (IMDUR) 30 MG 24 hr tablet Take 30 mg by mouth daily.      . metoprolol succinate (TOPROL-XL) 50 MG 24 hr tablet Take 50 mg by mouth daily. Take with or immediately following a meal.    .  pantoprazole (PROTONIX) 40 MG tablet Take 40 mg by mouth daily.  3  . ramipril (ALTACE) 5 MG capsule Take 10 mg by mouth daily.      . ranitidine (ZANTAC) 300 MG tablet Take 1 tablet by mouth at bedtime.  5  . nitroGLYCERIN (NITROSTAT) 0.4 MG SL tablet Place 0.4 mg under the tongue every 5 (five) minutes as needed.    . pravastatin (PRAVACHOL) 40 MG tablet Take 40 mg by mouth at bedtime.     No current facility-administered medications for this visit.    REVIEW OF SYSTEMS:   Constitutional: Denies fevers, chills or abnormal night sweats Eyes: Denies blurriness of vision, double vision or watery eyes Ears, nose, mouth, throat, and face: Denies mucositis or sore throat Respiratory: Denies  cough, dyspnea or wheezes Cardiovascular: Denies palpitation, chest discomfort or lower extremity swelling Gastrointestinal:  Denies nausea, heartburn or change in bowel habits Skin: Denies abnormal skin rashes Lymphatics: Denies new lymphadenopathy or easy bruising Neurological:Denies numbness, tingling or new weaknesses Behavioral/Psych: Mood is stable, no new changes  All other systems were reviewed with the patient and are negative.  PHYSICAL EXAMINATION: ECOG PERFORMANCE STATUS: 0 - Asymptomatic  Filed Vitals:   06/09/15 0955  BP: 132/74  Pulse: 70  Temp: 98.4 F (36.9 C)  Resp: 16   Filed Weights   06/09/15 0955  Weight: 182 lb 1.6 oz (82.6 kg)    GENERAL:alert, no distress and comfortable SKIN: skin color, texture, turgor are normal, no rashes or significant lesions EYES: normal, conjunctiva are pink and non-injected, sclera clear OROPHARYNX:no exudate, no erythema and lips, buccal mucosa, and tongue normal  NECK: supple, thyroid normal size, non-tender, without nodularity LYMPH:  no palpable lymphadenopathy in the cervical, axillary or inguinal LUNGS: clear to auscultation and percussion with normal breathing effort HEART: regular rate & rhythm and no murmurs and no lower extremity  edema ABDOMEN:abdomen soft, non-tender and normal bowel sounds Musculoskeletal:no cyanosis of digits and no clubbing  PSYCH: alert & oriented x 3 with fluent speech NEURO: no focal motor/sensory deficits. Breasts: s/p right mastectomy, surgical scar well healed. No palpable mass in the right chest wall or axilla.  Palpation of the left breasts and axilla revealed no obvious mass that I could appreciate, a small surgical scar in left breast is well-healed.    LABORATORY DATA:  I have reviewed the data as listed CBC Latest Ref Rng 06/09/2015 03/14/2013 03/13/2013  WBC 3.9 - 10.3 10e3/uL 6.8 8.6 8.2  Hemoglobin 11.6 - 15.9 g/dL 11.7 12.8 14.3  Hematocrit 34.8 - 46.6 % 36.9 37.5 41.4  Platelets 145 - 400 10e3/uL 247 269 255    CMP Latest Ref Rng 06/09/2015 03/14/2013 03/13/2013  Glucose 70 - 140 mg/dl 80 102(H) 90  BUN 7.0 - 26.0 mg/dL 14.'9 11 12  ' Creatinine 0.6 - 1.1 mg/dL 1.3(H) 0.68 0.71  Sodium 136 - 145 mEq/L 141 139 136  Potassium 3.5 - 5.1 mEq/L 4.5 3.7 3.7  Chloride 96 - 112 mEq/L - 107 105  CO2 22 - 29 mEq/L 23 22 17(L)  Calcium 8.4 - 10.4 mg/dL 9.1 8.8 9.0  Total Protein 6.4 - 8.3 g/dL 6.9 - 6.8  Total Bilirubin 0.20 - 1.20 mg/dL 0.38 - 0.2(L)  Alkaline Phos 40 - 150 U/L 92 - 91  AST 5 - 34 U/L 24 - 48(H)  ALT 0 - 55 U/L 21 - 41(H)    PATHOLOGY REPORT Pathology: ACCESSION NUMBER: [id number] RECEIVED: 09/01/2014 ORDERING PHYSICIAN: MARISSA HOWARD-MCNATT , MD PATIENT NAME: Teicher, Genetta SURGICAL PATHOLOGY REPORT  FINAL PATHOLOGIC DIAGNOSIS MICROSCOPIC EXAMINATION AND DIAGNOSIS  A. SENTINEL LYMPH NODE, RIGHT AXILLA, #1, EXCISION: One benign lymph node (0/1).  B. SENTINEL LYMPH NODE, RIGHT AXILLA, #2, EXCISION:  One benign lymph node (0/1).  C. BREAST, RIGHT, MASTECTOMY: Invasive ductal carcinoma, two foci, 0.3 and 0.2 cm, arising in background of ductal carcinoma in situ, intermediate-high nuclear grade, papillary growth pattern with necrosis and  calcifications. Previous biopsy site changes. Margins negative for malignancy. Unremarkable skin. See comment and cancer protocol.  D. BREAST, LEFT, NEEDLE LOCALIZED LUMPECTOMY: Fibrocystic changes including stromal fibrosis, cysts formation, apocrine metaplasia, columnar cell alteration and usual ductal hyperplasia. Negative for atypia and carcinoma. Previous biopsy site changes. Microcalcifications present.   RADIOGRAPHIC STUDIES: I have personally reviewed  the radiological images as listed and agreed with the findings in the report. No results found.  ASSESSMENT:  69 yo postmenopausal African-American female  1. Right breast multifocal invasive ductal carcinoma, pT1aN0M0, stage IA, ER+/PR+, and background DCIS  -She is status post mastectomy, on adjuvant exemestane now. -I reviewed the natural history of early stage breast cancer. Given her small size of tumor, strong ER/PR positivity, her risk of cancer recurrence is low. -I strongly encouraged her to continue adjuvant eczema staining, for total 5 years. Although we do have clinical data showing that 10 years adjuvant AI is better than 5 years, however giving her early stage of cancer, I think that 5 years of adjuvant therapy would be adequate. -We again reviewed the side effects of exemestane, she is tolerating well, except mild hot flash. -We'll continue surveillance was annual mammogram, next due in April 2017. I'll follow-up with her every 3 months until the end of this year, then every 4-6 months until she completes adjuvant therapy, then once a year. -We discussed the role of healthy diet and regular exercise -I encouraged her to continue calcium and vitamin D for bone health.  2. Iron deficient anemia -She had a study at [facility] in February 2016, which showed iron 19, ferritin 8, TIBC 460, and saturation 4%. This is consistent with iron deficient anemia. -She was seen by a  gastroenterologist in [place] religious months. Endoscopy was recommended. I encouraged her to follow-up with him. -She is still mildly anemic, with hemoglobin 11.7 today. I'll check her iron level on next visit, and consider IV Feraheme if her ferritin still low.  3. Pulmonary nodules -She was found to have a 3 mm noncalcified nodule in the right lung base. Repeat CT scan on 06/08/2015 showed no change, and additional smaller calcified and noncalcified nodules are all stable. -She has long-standing smoking history, but quit in May 2015.  4. HTN, CAD, asthma, GERD -She'll continue follow-up with her primary care physician  Plan -Lab today -Return to clinic in 3 months with lab.    Truitt Merle, MD 06/09/2015 7:02 PM

## 2015-06-09 NOTE — Progress Notes (Signed)
Requested that HIM get records from [place].

## 2015-07-06 DIAGNOSIS — D123 Benign neoplasm of transverse colon: Secondary | ICD-10-CM | POA: Diagnosis not present

## 2015-07-06 DIAGNOSIS — K644 Residual hemorrhoidal skin tags: Secondary | ICD-10-CM | POA: Diagnosis not present

## 2015-07-06 DIAGNOSIS — K2930 Chronic superficial gastritis without bleeding: Secondary | ICD-10-CM | POA: Diagnosis not present

## 2015-07-06 DIAGNOSIS — K5790 Diverticulosis of intestine, part unspecified, without perforation or abscess without bleeding: Secondary | ICD-10-CM | POA: Diagnosis not present

## 2015-07-06 DIAGNOSIS — K648 Other hemorrhoids: Secondary | ICD-10-CM | POA: Diagnosis not present

## 2015-07-06 DIAGNOSIS — D509 Iron deficiency anemia, unspecified: Secondary | ICD-10-CM | POA: Diagnosis not present

## 2015-07-06 DIAGNOSIS — R1013 Epigastric pain: Secondary | ICD-10-CM | POA: Diagnosis not present

## 2015-07-06 DIAGNOSIS — K2980 Duodenitis without bleeding: Secondary | ICD-10-CM | POA: Diagnosis not present

## 2015-07-06 DIAGNOSIS — K5730 Diverticulosis of large intestine without perforation or abscess without bleeding: Secondary | ICD-10-CM | POA: Diagnosis not present

## 2015-07-06 DIAGNOSIS — K635 Polyp of colon: Secondary | ICD-10-CM | POA: Diagnosis not present

## 2015-07-06 DIAGNOSIS — K2960 Other gastritis without bleeding: Secondary | ICD-10-CM | POA: Diagnosis not present

## 2015-07-06 DIAGNOSIS — K449 Diaphragmatic hernia without obstruction or gangrene: Secondary | ICD-10-CM | POA: Diagnosis not present

## 2015-07-06 DIAGNOSIS — K2210 Ulcer of esophagus without bleeding: Secondary | ICD-10-CM | POA: Diagnosis not present

## 2015-07-06 DIAGNOSIS — Z1211 Encounter for screening for malignant neoplasm of colon: Secondary | ICD-10-CM | POA: Diagnosis not present

## 2015-07-06 DIAGNOSIS — D124 Benign neoplasm of descending colon: Secondary | ICD-10-CM | POA: Diagnosis not present

## 2015-08-12 DIAGNOSIS — K449 Diaphragmatic hernia without obstruction or gangrene: Secondary | ICD-10-CM | POA: Diagnosis not present

## 2015-08-12 DIAGNOSIS — Z86010 Personal history of colonic polyps: Secondary | ICD-10-CM | POA: Diagnosis not present

## 2015-08-12 DIAGNOSIS — D0511 Intraductal carcinoma in situ of right breast: Secondary | ICD-10-CM | POA: Diagnosis not present

## 2015-08-12 DIAGNOSIS — Z7982 Long term (current) use of aspirin: Secondary | ICD-10-CM | POA: Diagnosis not present

## 2015-08-12 DIAGNOSIS — F419 Anxiety disorder, unspecified: Secondary | ICD-10-CM | POA: Diagnosis not present

## 2015-08-12 DIAGNOSIS — R197 Diarrhea, unspecified: Secondary | ICD-10-CM | POA: Diagnosis not present

## 2015-08-12 DIAGNOSIS — L930 Discoid lupus erythematosus: Secondary | ICD-10-CM | POA: Diagnosis not present

## 2015-08-12 DIAGNOSIS — K219 Gastro-esophageal reflux disease without esophagitis: Secondary | ICD-10-CM | POA: Diagnosis not present

## 2015-08-12 DIAGNOSIS — I10 Essential (primary) hypertension: Secondary | ICD-10-CM | POA: Diagnosis not present

## 2015-08-12 DIAGNOSIS — D500 Iron deficiency anemia secondary to blood loss (chronic): Secondary | ICD-10-CM | POA: Diagnosis not present

## 2015-08-12 DIAGNOSIS — I2510 Atherosclerotic heart disease of native coronary artery without angina pectoris: Secondary | ICD-10-CM | POA: Diagnosis not present

## 2015-09-10 ENCOUNTER — Encounter: Payer: Self-pay | Admitting: Hematology

## 2015-09-10 ENCOUNTER — Telehealth: Payer: Self-pay | Admitting: Hematology

## 2015-09-10 ENCOUNTER — Ambulatory Visit (HOSPITAL_BASED_OUTPATIENT_CLINIC_OR_DEPARTMENT_OTHER): Payer: Medicare PPO | Admitting: Hematology

## 2015-09-10 ENCOUNTER — Other Ambulatory Visit (HOSPITAL_BASED_OUTPATIENT_CLINIC_OR_DEPARTMENT_OTHER): Payer: Medicare PPO

## 2015-09-10 VITALS — BP 173/77 | HR 71 | Temp 98.3°F | Resp 18 | Ht 67.0 in | Wt 185.4 lb

## 2015-09-10 DIAGNOSIS — D509 Iron deficiency anemia, unspecified: Secondary | ICD-10-CM

## 2015-09-10 DIAGNOSIS — C50411 Malignant neoplasm of upper-outer quadrant of right female breast: Secondary | ICD-10-CM | POA: Diagnosis not present

## 2015-09-10 DIAGNOSIS — Z170 Estrogen receptor positive status [ER+]: Secondary | ICD-10-CM

## 2015-09-10 DIAGNOSIS — D0511 Intraductal carcinoma in situ of right breast: Secondary | ICD-10-CM

## 2015-09-10 DIAGNOSIS — R918 Other nonspecific abnormal finding of lung field: Secondary | ICD-10-CM | POA: Diagnosis not present

## 2015-09-10 DIAGNOSIS — Z23 Encounter for immunization: Secondary | ICD-10-CM | POA: Diagnosis not present

## 2015-09-10 LAB — CBC WITH DIFFERENTIAL/PLATELET
BASO%: 1.2 % (ref 0.0–2.0)
Basophils Absolute: 0.1 10*3/uL (ref 0.0–0.1)
EOS ABS: 0.1 10*3/uL (ref 0.0–0.5)
EOS%: 1.9 % (ref 0.0–7.0)
HCT: 40.1 % (ref 34.8–46.6)
HEMOGLOBIN: 13 g/dL (ref 11.6–15.9)
LYMPH%: 29.6 % (ref 14.0–49.7)
MCH: 29.5 pg (ref 25.1–34.0)
MCHC: 32.4 g/dL (ref 31.5–36.0)
MCV: 91.2 fL (ref 79.5–101.0)
MONO#: 0.6 10*3/uL (ref 0.1–0.9)
MONO%: 9.1 % (ref 0.0–14.0)
NEUT#: 4.1 10*3/uL (ref 1.5–6.5)
NEUT%: 58.2 % (ref 38.4–76.8)
PLATELETS: 307 10*3/uL (ref 145–400)
RBC: 4.4 10*6/uL (ref 3.70–5.45)
RDW: 15.1 % — ABNORMAL HIGH (ref 11.2–14.5)
WBC: 7 10*3/uL (ref 3.9–10.3)
lymph#: 2.1 10*3/uL (ref 0.9–3.3)

## 2015-09-10 LAB — COMPREHENSIVE METABOLIC PANEL (CC13)
ALK PHOS: 78 U/L (ref 40–150)
ALT: 25 U/L (ref 0–55)
ANION GAP: 9 meq/L (ref 3–11)
AST: 33 U/L (ref 5–34)
Albumin: 3.8 g/dL (ref 3.5–5.0)
BUN: 9.3 mg/dL (ref 7.0–26.0)
CALCIUM: 9.1 mg/dL (ref 8.4–10.4)
CO2: 24 mEq/L (ref 22–29)
Chloride: 112 mEq/L — ABNORMAL HIGH (ref 98–109)
Creatinine: 0.8 mg/dL (ref 0.6–1.1)
EGFR: 84 mL/min/{1.73_m2} — AB (ref >90)
Glucose: 94 mg/dl (ref 70–140)
Potassium: 4.1 mEq/L (ref 3.5–5.1)
Sodium: 145 mEq/L (ref 136–145)
Total Bilirubin: 0.36 mg/dL (ref 0.20–1.20)
Total Protein: 6.9 g/dL (ref 6.4–8.3)

## 2015-09-10 LAB — IRON AND TIBC CHCC
%SAT: 8 % — ABNORMAL LOW (ref 21–57)
Iron: 35 ug/dL — ABNORMAL LOW (ref 41–142)
TIBC: 431 ug/dL (ref 236–444)
UIBC: 396 ug/dL — ABNORMAL HIGH (ref 120–384)

## 2015-09-10 LAB — FERRITIN CHCC: Ferritin: 12 ng/ml (ref 9–269)

## 2015-09-10 MED ORDER — EXEMESTANE 25 MG PO TABS: 25.0000 mg | ORAL_TABLET | Freq: Every day | ORAL | Status: DC

## 2015-09-10 MED ORDER — INFLUENZA VAC SPLIT QUAD 0.5 ML IM SUSY
0.5000 mL | PREFILLED_SYRINGE | Freq: Once | INTRAMUSCULAR | Status: AC
  Administered 2015-09-10: 0.5 mL via INTRAMUSCULAR
  Filled 2015-09-10: qty 0.5

## 2015-09-10 NOTE — Telephone Encounter (Signed)
Pt confirmed labs/ov per 09/28 POF, gave pt AVS and Calendar... KJ °

## 2015-09-10 NOTE — Progress Notes (Signed)
[place] NOTE  Patient Care Team: Jearld Fenton, NP as PCP - General (Internal Medicine) Charolette Forward, MD as Consulting Physician (Cardiology) Danice Goltz Sorscher, MD (Hematology and Oncology)  CHIEF COMPLAINTS/PURPOSE OF CONSULTATION:  Follow up right breast cancer  Oncology History   Breast cancer of upper-outer quadrant of right female breast   Staging form: Breast, AJCC 7th Edition     Pathologic stage from 09/01/2014: Stage IA (T1a(m), N0, cM0) - Unsigned       Breast cancer of upper-outer quadrant of right female breast   05/21/2014 Initial Biopsy Right breast biopsy showed grade 2 DCIS with papillary features, ER/PR strongly positive.   05/21/2014 Receptors her2 ER 93%+, PR 100%   05/21/2014 Initial Diagnosis Breast cancer of upper-outer quadrant of right female breast   09/01/2014 Surgery Right mastectomy with sentinel lymph node biopsy, surgical margins were negative. Left lumpectomy showed fibrocystic changes.    10/11/2014 -  Anti-estrogen oral therapy Exemestane 25 mg once daily    HISTORY OF PRESENTING ILLNESS:  [name] 69 y.o. female with past medical history of stage I right breast cancer, presents to our clinic to transfer her oncology care to our cancer center.   The cancer was detected by screening mammogram in March 2015. The cancer was not palpable prior to diagnosis. She underwent initial core needle biopsy of the right breast mass, which showed grade 2 DCIS, ER PR positive. She initially was seen by surgeon Dr. Lucia Gaskins in [place], then she decided to transfer her care to [facility]. She underwent right breast mastectomy and sentinel lymph node, which showed 2 small focus of stage IA invasive ductal carcinoma, left breast lumpectomy showed benign fibrocystic changes. She did not have reconstruction. She subsequently started antiestrogen oral therapy with exemestane in November 2015. She has been under Dr.  Johnny Bridge care at [facility] in [place]. She lives in [place], and I would like to transfer her care to our cancer center which is close to home.  She is doing well overall. She has had hot flush at night for many years, which got slightly worse after she started exemestane, it wakes her up at night, but overall still manageable and tolerable. No joint or other complains. She has mild fatigue, appetite is moderate. Wight stable.   I reviewed her outside records extensively and collaborated the history with the patient.  INTERIM HISTORY [name] returns for follow-up. She is doing well overall. She takes exemestane and night, due to slightly dizziness from the medication. She has a mild hot flash, manageable. No other side effects. She occasionally has shooting pain at the mastectomy site, no other pain or other complaints. She is scheduled to go back to [facility] and have a mammogram next week.  SUMMARY OF ONCOLOGIC HISTORY: Oncology History   Breast cancer of upper-outer quadrant of right female breast   Staging form: Breast, AJCC 7th Edition     Pathologic stage from 09/01/2014: Stage IA (T1a(m), N0, cM0) - Unsigned       Breast cancer of upper-outer quadrant of right female breast   05/21/2014 Initial Biopsy Right breast biopsy showed grade 2 DCIS with papillary features, ER/PR strongly positive.   05/21/2014 Receptors her2 ER 93%+, PR 100%   05/21/2014 Initial Diagnosis Breast cancer of upper-outer quadrant of right female breast   09/01/2014 Surgery Right mastectomy with sentinel lymph node biopsy, surgical margins were negative. Left lumpectomy showed fibrocystic changes.    10/11/2014 -  Anti-estrogen  oral therapy Exemestane 25 mg once daily    In terms of breast cancer risk profile:  She menarched at early age of 33 and went to menopause at age 33 She had none pregnancy.  She received birth control pills for approximately 10 years.  She was never exposed to fertility medications or  hormone replacement therapy.  She has family history of Breast/GYN/GI cancer  INTERVAL HISTORY: Currently, she is healing well after surgery apart from mild discomfort. She has no concern for local infection  MEDICAL HISTORY:  Past Medical History  Diagnosis Date  . Hypertension   . Myocardial infarction   . Allergic rhinitis   . Asthma   . Arthritis   . Emphysema of lung   . Coronary artery disease   . Anginal pain   . GERD (gastroesophageal reflux disease)   . Lupus (systemic lupus erythematosus)     SURGICAL HISTORY: Past Surgical History  Procedure Laterality Date  . Coronary stent placement  1995    SOCIAL HISTORY: Social History   Social History  . Marital Status: Divorced    Spouse Name: N/A  . Number of Children: N/A  . Years of Education: 12   Occupational History  . retired from the school Occupational psychologist   Social History Main Topics  . Smoking status: Former Smoker -- 0.50 packs/day for 50 years    Types: Cigarettes    Quit date: 04/10/2014  . Smokeless tobacco: Never Used  . Alcohol Use: Yes     Comment: occassional  . Drug Use: No  . Sexual Activity: Yes    Birth Control/ Protection: Post-menopausal   Other Topics Concern  . Not on file   Social History Narrative   Pt is divorced and lives alone.    Regular exercise-no   Caffeine Use-yes    FAMILY HISTORY: Family History  Problem Relation Age of Onset  . Allergies Mother   . Heart disease Mother   . Diabetes Mother   . Hypertension Mother   . Cancer Father     Lung Cancer  . Cancer Sister 33    lung cancer   . Cancer Maternal Aunt 78    colon cancer   . Cancer Maternal Aunt 90    uterine cancer     ALLERGIES:  is allergic to lipitor.  MEDICATIONS:  Current Outpatient Prescriptions  Medication Sig Dispense Refill  . ALPRAZolam (XANAX) 0.25 MG tablet Take 0.25 mg by mouth 2 (two) times daily as needed.     Marland Kitchen amLODipine (NORVASC) 5 MG tablet Take 5 mg by mouth  daily.    Marland Kitchen aspirin EC 81 MG EC tablet Take 1 tablet (81 mg total) by mouth daily. 30 tablet 3  . exemestane (AROMASIN) 25 MG tablet Take 1 tablet by mouth daily.  2  . isosorbide mononitrate (IMDUR) 30 MG 24 hr tablet Take 30 mg by mouth daily.      . metoprolol succinate (TOPROL-XL) 50 MG 24 hr tablet Take 50 mg by mouth daily. Take with or immediately following a meal.    . nitroGLYCERIN (NITROSTAT) 0.4 MG SL tablet Place 0.4 mg under the tongue every 5 (five) minutes as needed.    . pantoprazole (PROTONIX) 40 MG tablet Take 40 mg by mouth daily.  3  . pravastatin (PRAVACHOL) 40 MG tablet Take 40 mg by mouth at bedtime.    . ramipril (ALTACE) 5 MG capsule Take 10 mg by mouth daily.      Marland Kitchen  ranitidine (ZANTAC) 300 MG tablet Take 1 tablet by mouth at bedtime.  5   No current facility-administered medications for this visit.    REVIEW OF SYSTEMS:   Constitutional: Denies fevers, chills or abnormal night sweats Eyes: Denies blurriness of vision, double vision or watery eyes Ears, nose, mouth, throat, and face: Denies mucositis or sore throat Respiratory: Denies cough, dyspnea or wheezes Cardiovascular: Denies palpitation, chest discomfort or lower extremity swelling Gastrointestinal:  Denies nausea, heartburn or change in bowel habits Skin: Denies abnormal skin rashes Lymphatics: Denies new lymphadenopathy or easy bruising Neurological:Denies numbness, tingling or new weaknesses Behavioral/Psych: Mood is stable, no new changes  All other systems were reviewed with the patient and are negative.  PHYSICAL EXAMINATION: ECOG PERFORMANCE STATUS: 0 - Asymptomatic  Filed Vitals:   09/10/15 1348  BP: 173/77  Pulse: 71  Temp: 98.3 F (36.8 C)  Resp: 18   Filed Weights   09/10/15 1348  Weight: 185 lb 6.4 oz (84.097 kg)    GENERAL:alert, no distress and comfortable SKIN: skin color, texture, turgor are normal, no rashes or significant lesions EYES: normal, conjunctiva are pink and  non-injected, sclera clear OROPHARYNX:no exudate, no erythema and lips, buccal mucosa, and tongue normal  NECK: supple, thyroid normal size, non-tender, without nodularity LYMPH:  no palpable lymphadenopathy in the cervical, axillary or inguinal LUNGS: clear to auscultation and percussion with normal breathing effort HEART: regular rate & rhythm and no murmurs and no lower extremity edema ABDOMEN:abdomen soft, non-tender and normal bowel sounds Musculoskeletal:no cyanosis of digits and no clubbing  PSYCH: alert & oriented x 3 with fluent speech NEURO: no focal motor/sensory deficits. Breasts: s/p right mastectomy, surgical scar well healed. No palpable mass in the right chest wall or axilla.  Palpation of the left breasts and axilla revealed no obvious mass that I could appreciate, a small surgical scar in left breast is well-healed.    LABORATORY DATA:  I have reviewed the data as listed CBC Latest Ref Rng 09/10/2015 06/09/2015 03/14/2013  WBC 3.9 - 10.3 10e3/uL 7.0 6.8 8.6  Hemoglobin 11.6 - 15.9 g/dL 13.0 11.7 12.8  Hematocrit 34.8 - 46.6 % 40.1 36.9 37.5  Platelets 145 - 400 10e3/uL 307 247 269    CMP Latest Ref Rng 06/09/2015 03/14/2013 03/13/2013  Glucose 70 - 140 mg/dl 80 102(H) 90  BUN 7.0 - 26.0 mg/dL 14.'9 11 12  ' Creatinine 0.6 - 1.1 mg/dL 1.3(H) 0.68 0.71  Sodium 136 - 145 mEq/L 141 139 136  Potassium 3.5 - 5.1 mEq/L 4.5 3.7 3.7  Chloride 96 - 112 mEq/L - 107 105  CO2 22 - 29 mEq/L 23 22 17(L)  Calcium 8.4 - 10.4 mg/dL 9.1 8.8 9.0  Total Protein 6.4 - 8.3 g/dL 6.9 - 6.8  Total Bilirubin 0.20 - 1.20 mg/dL 0.38 - 0.2(L)  Alkaline Phos 40 - 150 U/L 92 - 91  AST 5 - 34 U/L 24 - 48(H)  ALT 0 - 55 U/L 21 - 41(H)    PATHOLOGY REPORT Pathology: ACCESSION NUMBER: [id number] RECEIVED: 09/01/2014 ORDERING PHYSICIAN: MARISSA HOWARD-MCNATT , MD PATIENT NAME: Hall, Ana SURGICAL PATHOLOGY REPORT  FINAL PATHOLOGIC DIAGNOSIS MICROSCOPIC EXAMINATION AND DIAGNOSIS  A. SENTINEL  LYMPH NODE, RIGHT AXILLA, #1, EXCISION: One benign lymph node (0/1).  B. SENTINEL LYMPH NODE, RIGHT AXILLA, #2, EXCISION:  One benign lymph node (0/1).  C. BREAST, RIGHT, MASTECTOMY: Invasive ductal carcinoma, two foci, 0.3 and 0.2 cm, arising in background of ductal carcinoma in situ, intermediate-high nuclear grade, papillary growth  pattern with necrosis and calcifications. Previous biopsy site changes. Margins negative for malignancy. Unremarkable skin. See comment and cancer protocol.  D. BREAST, LEFT, NEEDLE LOCALIZED LUMPECTOMY: Fibrocystic changes including stromal fibrosis, cysts formation, apocrine metaplasia, columnar cell alteration and usual ductal hyperplasia. Negative for atypia and carcinoma. Previous biopsy site changes. Microcalcifications present.   RADIOGRAPHIC STUDIES: I have personally reviewed the radiological images as listed and agreed with the findings in the report. No results found.  ASSESSMENT:  69 yo postmenopausal African-American female  1. Right breast multifocal invasive ductal carcinoma, pT1aN0M0, stage IA, ER+/PR+, and background DCIS  -She is status post mastectomy, on adjuvant exemestane now. -I reviewed the natural history of early stage breast cancer. Given her small size of tumor, strong ER/PR positivity, her risk of cancer recurrence is low. -I strongly encouraged her to continue exemastine, for total 5 years. Although we do have clinical data showing that 10 years adjuvant AI is better than 5 years, however giving her early stage of cancer, I think that 5 years of adjuvant therapy would be adequate. -We again reviewed the side effects of exemestane, she is tolerating well, except mild hot flash. -We'll continue surveillance was annual mammogram, she will have one next week at the [facility]. -We discussed the role of healthy diet and regular exercise -I encouraged her to continue calcium  and vitamin D for bone health. -Lab reviewed, her exam was unremarkable. No evidence of recurrence.  2. Iron deficient anemia -She had a study at [facility] in February 2016, which showed iron 19, ferritin 8, TIBC 460, and saturation 4%. This is consistent with iron deficient anemia. -She was seen by a gastroenterologist in [place] religious months. Endoscopy was recommended. I encouraged her to follow-up with him. -She had a study today, results still pending. Her hemoglobin is normal today  3. Pulmonary nodules -She was found to have a 3 mm noncalcified nodule in the right lung base. Repeat CT scan on 06/08/2015 showed no change, and additional smaller calcified and noncalcified nodules are all stable. -She has long-standing smoking history, but quit in May 2015.  4. HTN, CAD, asthma, GERD -She'll continue follow-up with her primary care physician  Plan -flu shot today  -Return to clinic in 4 months with lab.    Truitt Merle, MD 09/10/2015 1:58 PM

## 2015-09-14 ENCOUNTER — Telehealth: Payer: Self-pay | Admitting: Hematology

## 2015-09-14 NOTE — Telephone Encounter (Signed)
I called her and I left a message. I told her about our study from last week, which showed low ferritin and iron level. I recommend her to start taking iron pill over-the-counter 1-2 tablet a day, or if she does not want take iron pill, I can set up IV iron for her. I encouraged her to call me back.  Truitt Merle  09/14/2015

## 2015-09-15 DIAGNOSIS — D0511 Intraductal carcinoma in situ of right breast: Secondary | ICD-10-CM | POA: Diagnosis not present

## 2015-09-15 DIAGNOSIS — C50111 Malignant neoplasm of central portion of right female breast: Secondary | ICD-10-CM | POA: Diagnosis not present

## 2015-09-15 DIAGNOSIS — Z7982 Long term (current) use of aspirin: Secondary | ICD-10-CM | POA: Diagnosis not present

## 2015-09-15 DIAGNOSIS — I252 Old myocardial infarction: Secondary | ICD-10-CM | POA: Diagnosis not present

## 2015-09-15 DIAGNOSIS — I10 Essential (primary) hypertension: Secondary | ICD-10-CM | POA: Diagnosis not present

## 2015-09-15 DIAGNOSIS — C50911 Malignant neoplasm of unspecified site of right female breast: Secondary | ICD-10-CM | POA: Diagnosis not present

## 2015-09-15 DIAGNOSIS — I2510 Atherosclerotic heart disease of native coronary artery without angina pectoris: Secondary | ICD-10-CM | POA: Diagnosis not present

## 2015-09-16 ENCOUNTER — Other Ambulatory Visit: Payer: Self-pay | Admitting: Hematology

## 2015-09-16 DIAGNOSIS — D508 Other iron deficiency anemias: Secondary | ICD-10-CM

## 2015-09-16 NOTE — Telephone Encounter (Signed)
Called patient back to inform her about over the counter iron to pick up. Patient would like to know when would you like to set up an appointment for labs.

## 2015-09-16 NOTE — Telephone Encounter (Signed)
I called her back and left a message to informed that I will move her next lab appointment in Jan to a few days before her visit with me.   [name]  09/16/2015

## 2015-09-17 ENCOUNTER — Telehealth: Payer: Self-pay | Admitting: Hematology

## 2015-09-17 NOTE — Telephone Encounter (Signed)
lvm for pt regarding to Jan appts...maield pt appt sched and letter

## 2015-09-22 DIAGNOSIS — D649 Anemia, unspecified: Secondary | ICD-10-CM | POA: Diagnosis not present

## 2015-09-22 DIAGNOSIS — I10 Essential (primary) hypertension: Secondary | ICD-10-CM | POA: Diagnosis not present

## 2015-09-22 DIAGNOSIS — M1990 Unspecified osteoarthritis, unspecified site: Secondary | ICD-10-CM | POA: Diagnosis not present

## 2015-09-22 DIAGNOSIS — E669 Obesity, unspecified: Secondary | ICD-10-CM | POA: Diagnosis not present

## 2015-09-22 DIAGNOSIS — K219 Gastro-esophageal reflux disease without esophagitis: Secondary | ICD-10-CM | POA: Diagnosis not present

## 2015-09-22 DIAGNOSIS — E785 Hyperlipidemia, unspecified: Secondary | ICD-10-CM | POA: Diagnosis not present

## 2015-09-22 DIAGNOSIS — I2510 Atherosclerotic heart disease of native coronary artery without angina pectoris: Secondary | ICD-10-CM | POA: Diagnosis not present

## 2016-01-03 ENCOUNTER — Telehealth: Payer: Self-pay | Admitting: Hematology

## 2016-01-03 ENCOUNTER — Other Ambulatory Visit: Payer: Medicare PPO

## 2016-01-03 NOTE — Telephone Encounter (Signed)
Left message for patiient to call the office to give a date and time to r/s her lab appointment for 1/23

## 2016-01-04 ENCOUNTER — Telehealth: Payer: Self-pay | Admitting: Hematology

## 2016-01-04 NOTE — Telephone Encounter (Signed)
pt cld to r/s lab-gave pt time & date

## 2016-01-05 ENCOUNTER — Other Ambulatory Visit (HOSPITAL_BASED_OUTPATIENT_CLINIC_OR_DEPARTMENT_OTHER): Payer: Medicare Other

## 2016-01-05 DIAGNOSIS — D509 Iron deficiency anemia, unspecified: Secondary | ICD-10-CM

## 2016-01-05 DIAGNOSIS — C50411 Malignant neoplasm of upper-outer quadrant of right female breast: Secondary | ICD-10-CM

## 2016-01-05 DIAGNOSIS — D508 Other iron deficiency anemias: Secondary | ICD-10-CM

## 2016-01-05 DIAGNOSIS — D0511 Intraductal carcinoma in situ of right breast: Secondary | ICD-10-CM

## 2016-01-05 LAB — CBC WITH DIFFERENTIAL/PLATELET
BASO%: 0.8 % (ref 0.0–2.0)
BASOS ABS: 0.1 10*3/uL (ref 0.0–0.1)
EOS ABS: 0.1 10*3/uL (ref 0.0–0.5)
EOS%: 2.1 % (ref 0.0–7.0)
HEMATOCRIT: 42.9 % (ref 34.8–46.6)
HEMOGLOBIN: 14 g/dL (ref 11.6–15.9)
LYMPH#: 2.1 10*3/uL (ref 0.9–3.3)
LYMPH%: 34.2 % (ref 14.0–49.7)
MCH: 30.4 pg (ref 25.1–34.0)
MCHC: 32.6 g/dL (ref 31.5–36.0)
MCV: 93.3 fL (ref 79.5–101.0)
MONO#: 0.5 10*3/uL (ref 0.1–0.9)
MONO%: 7.4 % (ref 0.0–14.0)
NEUT#: 3.4 10*3/uL (ref 1.5–6.5)
NEUT%: 55.5 % (ref 38.4–76.8)
Platelets: 259 10*3/uL (ref 145–400)
RBC: 4.6 10*6/uL (ref 3.70–5.45)
RDW: 14.3 % (ref 11.2–14.5)
WBC: 6.1 10*3/uL (ref 3.9–10.3)

## 2016-01-05 LAB — COMPREHENSIVE METABOLIC PANEL
ALT: 28 U/L (ref 0–55)
AST: 29 U/L (ref 5–34)
Albumin: 3.9 g/dL (ref 3.5–5.0)
Alkaline Phosphatase: 81 U/L (ref 40–150)
Anion Gap: 8 mEq/L (ref 3–11)
BUN: 10.5 mg/dL (ref 7.0–26.0)
CO2: 22 meq/L (ref 22–29)
Calcium: 9.2 mg/dL (ref 8.4–10.4)
Chloride: 111 mEq/L — ABNORMAL HIGH (ref 98–109)
Creatinine: 0.9 mg/dL (ref 0.6–1.1)
EGFR: 80 mL/min/{1.73_m2} — ABNORMAL LOW (ref >90)
GLUCOSE: 100 mg/dL (ref 70–140)
Potassium: 3.7 mEq/L (ref 3.5–5.1)
Sodium: 142 mEq/L (ref 136–145)
TOTAL PROTEIN: 7.4 g/dL (ref 6.4–8.3)
Total Bilirubin: 0.53 mg/dL (ref 0.20–1.20)

## 2016-01-05 LAB — FERRITIN: Ferritin: 21 ng/ml (ref 9–269)

## 2016-01-05 LAB — IRON AND TIBC
%SAT: 46 % (ref 21–57)
Iron: 186 ug/dL — ABNORMAL HIGH (ref 41–142)
TIBC: 404 ug/dL (ref 236–444)
UIBC: 218 ug/dL (ref 120–384)

## 2016-01-10 ENCOUNTER — Other Ambulatory Visit: Payer: Medicare PPO

## 2016-01-10 ENCOUNTER — Telehealth: Payer: Self-pay | Admitting: Hematology

## 2016-01-10 ENCOUNTER — Encounter: Payer: Medicare PPO | Admitting: Hematology

## 2016-01-10 ENCOUNTER — Encounter: Payer: Self-pay | Admitting: Hematology

## 2016-01-10 NOTE — Telephone Encounter (Signed)
Pt called to r/s due to family emergency pt confirmed D/T .Marland KitchenMarland Kitchen KJ

## 2016-01-10 NOTE — Progress Notes (Signed)
This encounter was created in error - please disregard.

## 2016-01-11 ENCOUNTER — Ambulatory Visit (HOSPITAL_BASED_OUTPATIENT_CLINIC_OR_DEPARTMENT_OTHER): Payer: Medicare Other | Admitting: Hematology

## 2016-01-11 ENCOUNTER — Encounter: Payer: Self-pay | Admitting: Hematology

## 2016-01-11 VITALS — BP 135/60 | HR 80 | Temp 98.8°F | Resp 19 | Ht 67.0 in | Wt 240.1 lb

## 2016-01-11 DIAGNOSIS — Z79811 Long term (current) use of aromatase inhibitors: Secondary | ICD-10-CM

## 2016-01-11 DIAGNOSIS — C50411 Malignant neoplasm of upper-outer quadrant of right female breast: Secondary | ICD-10-CM | POA: Diagnosis not present

## 2016-01-11 DIAGNOSIS — D509 Iron deficiency anemia, unspecified: Secondary | ICD-10-CM

## 2016-01-11 DIAGNOSIS — Z170 Estrogen receptor positive status [ER+]: Secondary | ICD-10-CM | POA: Diagnosis not present

## 2016-01-11 DIAGNOSIS — R918 Other nonspecific abnormal finding of lung field: Secondary | ICD-10-CM

## 2016-01-11 MED ORDER — EXEMESTANE 25 MG PO TABS: 25.0000 mg | ORAL_TABLET | Freq: Every day | ORAL | Status: DC

## 2016-01-11 NOTE — Progress Notes (Signed)
[facility] NOTE  Patient Care Team: Jearld Fenton, NP as PCP - General (Internal Medicine) Charolette Forward, MD as Consulting Physician (Cardiology) Danice Goltz Sorscher, MD (Hematology and Oncology)  CHIEF COMPLAINTS/PURPOSE OF CONSULTATION:  Follow up right breast cancer  Oncology History   Breast cancer of upper-outer quadrant of right female breast   Staging form: Breast, AJCC 7th Edition     Pathologic stage from 09/01/2014: Stage IA (T1a(m), N0, cM0) - Unsigned       Breast cancer of upper-outer quadrant of right female breast ([place])   05/21/2014 Initial Biopsy Right breast biopsy showed grade 2 DCIS with papillary features, ER/PR strongly positive.   05/21/2014 Receptors her2 ER 93%+, PR 100%   05/21/2014 Initial Diagnosis Breast cancer of upper-outer quadrant of right female breast   09/01/2014 Surgery Right mastectomy with sentinel lymph node biopsy, surgical margins were negative. Left lumpectomy showed fibrocystic changes.    10/11/2014 -  Anti-estrogen oral therapy Exemestane 25 mg once daily    HISTORY OF PRESENTING ILLNESS:  [name] 70 y.o. female with past medical history of stage I right breast cancer, presents to our clinic to transfer her oncology care to our cancer center.   The cancer was detected by screening mammogram in March 2015. The cancer was not palpable prior to diagnosis. She underwent initial core needle biopsy of the right breast mass, which showed grade 2 DCIS, ER PR positive. She initially was seen by surgeon Dr. Lucia Gaskins in [place], then she decided to transfer her care to [facility]. She underwent right breast mastectomy and sentinel lymph node, which showed 2 small focus of stage IA invasive ductal carcinoma, left breast lumpectomy showed benign fibrocystic changes. She did not have reconstruction. She subsequently started antiestrogen oral therapy with exemestane in November 2015. She has been under  Dr. Johnny Bridge care at [facility] in [place]. She lives in [place], and I would like to transfer her care to our cancer center which is close to home.  She is doing well overall. She has had hot flush at night for many years, which got slightly worse after she started exemestane, it wakes her up at night, but overall still manageable and tolerable. No joint or other complains. She has mild fatigue, appetite is moderate. Wight stable.   I reviewed her outside records extensively and collaborated the history with the patient.  CURRENT THERAPY: Exemestane 25 mg once daily  INTERIM HISTORY Mette returns for follow-up. She is compliant with exemestane. She takes exemestane and night, due to slightly dizziness from the medication. She also complains of mild drowsiness from the medication. She has a mild to moderate hot flash, manageable. No complains of joint or muscular discomfort. She otherwise doing well, denies other new complaints. She has been taking iron pill, but not on a daily basis.    MEDICAL HISTORY:  Past Medical History  Diagnosis Date  . Hypertension   . Myocardial infarction ([employer])   . Allergic rhinitis   . Asthma   . Arthritis   . Emphysema of lung ([place])   . Coronary artery disease   . Anginal pain ([place])   . GERD (gastroesophageal reflux disease)   . Lupus (systemic lupus erythematosus) ([place])     SURGICAL HISTORY: Past Surgical History  Procedure Laterality Date  . Coronary stent placement  1995    SOCIAL HISTORY: Social History   Social History  . Marital Status: Divorced    Spouse Name: N/A  .  Number of Children: N/A  . Years of Education: 12   Occupational History  . retired from the school Occupational psychologist   Social History Main Topics  . Smoking status: Former Smoker -- 0.50 packs/day for 50 years    Types: Cigarettes    Quit date: 04/10/2014  . Smokeless tobacco: Never Used  . Alcohol Use: Yes     Comment: occassional  . Drug Use:  No  . Sexual Activity: Yes    Birth Control/ Protection: Post-menopausal   Other Topics Concern  . Not on file   Social History Narrative   Pt is divorced and lives alone.    Regular exercise-no   Caffeine Use-yes    FAMILY HISTORY: Family History  Problem Relation Age of Onset  . Allergies Mother   . Heart disease Mother   . Diabetes Mother   . Hypertension Mother   . Cancer Father     Lung Cancer  . Cancer Sister 51    lung cancer   . Cancer Maternal Aunt 78    colon cancer   . Cancer Maternal Aunt 90    uterine cancer     ALLERGIES:  is allergic to lipitor.  MEDICATIONS:  Current Outpatient Prescriptions  Medication Sig Dispense Refill  . ALPRAZolam (XANAX) 0.25 MG tablet Take 0.25 mg by mouth 2 (two) times daily as needed.     Marland Kitchen amLODipine (NORVASC) 5 MG tablet Take 5 mg by mouth daily.    Marland Kitchen aspirin EC 81 MG EC tablet Take 1 tablet (81 mg total) by mouth daily. 30 tablet 3  . exemestane (AROMASIN) 25 MG tablet Take 1 tablet (25 mg total) by mouth daily. 30 tablet 5  . isosorbide mononitrate (IMDUR) 30 MG 24 hr tablet Take 30 mg by mouth daily.      . metoprolol succinate (TOPROL-XL) 50 MG 24 hr tablet Take 50 mg by mouth daily. Take with or immediately following a meal.    . pantoprazole (PROTONIX) 40 MG tablet Take 40 mg by mouth daily.  3  . pravastatin (PRAVACHOL) 40 MG tablet Take 40 mg by mouth at bedtime.    . ramipril (ALTACE) 5 MG capsule Take 10 mg by mouth daily.      . ranitidine (ZANTAC) 300 MG tablet Take 1 tablet by mouth at bedtime.  5  . nitroGLYCERIN (NITROSTAT) 0.4 MG SL tablet Place 0.4 mg under the tongue every 5 (five) minutes as needed. Reported on 01/11/2016     No current facility-administered medications for this visit.    REVIEW OF SYSTEMS:   Constitutional: Denies fevers, chills or abnormal night sweats Eyes: Denies blurriness of vision, double vision or watery eyes Ears, nose, mouth, throat, and face: Denies mucositis or sore  throat Respiratory: Denies cough, dyspnea or wheezes Cardiovascular: Denies palpitation, chest discomfort or lower extremity swelling Gastrointestinal:  Denies nausea, heartburn or change in bowel habits Skin: Denies abnormal skin rashes Lymphatics: Denies new lymphadenopathy or easy bruising Neurological:Denies numbness, tingling or new weaknesses Behavioral/Psych: Mood is stable, no new changes  All other systems were reviewed with the patient and are negative.  PHYSICAL EXAMINATION: ECOG PERFORMANCE STATUS: 0 - Asymptomatic  Filed Vitals:   01/11/16 1532  BP: 135/60  Pulse: 80  Temp: 98.8 F (37.1 C)  Resp: 19   Filed Weights   01/11/16 1532  Weight: 240 lb 1.6 oz (108.909 kg)    GENERAL:alert, no distress and comfortable SKIN: skin color, texture, turgor  are normal, no rashes or significant lesions EYES: normal, conjunctiva are pink and non-injected, sclera clear OROPHARYNX:no exudate, no erythema and lips, buccal mucosa, and tongue normal  NECK: supple, thyroid normal size, non-tender, without nodularity LYMPH:  no palpable lymphadenopathy in the cervical, axillary or inguinal LUNGS: clear to auscultation and percussion with normal breathing effort HEART: regular rate & rhythm and no murmurs and no lower extremity edema ABDOMEN:abdomen soft, non-tender and normal bowel sounds Musculoskeletal:no cyanosis of digits and no clubbing  PSYCH: alert & oriented x 3 with fluent speech NEURO: no focal motor/sensory deficits. Breasts: s/p right mastectomy, surgical scar well healed. No palpable mass in the right chest wall or axilla.  Palpation of the left breasts and axilla revealed a smooth, movable small node in left axilla, no obvious mass in breast, the surgical scar in left breast is well-healed.    LABORATORY DATA:  I have reviewed the data as listed CBC Latest Ref Rng 01/05/2016 09/10/2015 06/09/2015  WBC 3.9 - 10.3 10e3/uL 6.1 7.0 6.8  Hemoglobin 11.6 - 15.9 g/dL 14.0  13.0 11.7  Hematocrit 34.8 - 46.6 % 42.9 40.1 36.9  Platelets 145 - 400 10e3/uL 259 307 247    CMP Latest Ref Rng 01/05/2016 09/10/2015 06/09/2015  Glucose 70 - 140 mg/dl 100 94 80  BUN 7.0 - 26.0 mg/dL 10.5 9.3 14.9  Creatinine 0.6 - 1.1 mg/dL 0.9 0.8 1.3(H)  Sodium 136 - 145 mEq/L 142 145 141  Potassium 3.5 - 5.1 mEq/L 3.7 4.1 4.5  Chloride 96 - 112 mEq/L - - -  CO2 22 - 29 mEq/L _0 Calcium 8.4 - 10.4 mg/dL 9.2 9.1 9.1  Total Protein 6.4 - 8.3 g/dL 7.4 6.9 6.9  Total Bilirubin 0.20 - 1.20 mg/dL 0.53 0.36 0.38  Alkaline Phos 40 - 150 U/L 81 78 92  AST 5 - 34 U/L 29 33 24  ALT 0 - 55 U/L _1 PATHOLOGY REPORT Pathology: ACCESSION NUMBER: [id number] RECEIVED: 09/01/2014 ORDERING PHYSICIAN: MARISSA HOWARD-MCNATT , MD PATIENT NAME: [name], [name] SURGICAL PATHOLOGY REPORT  FINAL PATHOLOGIC DIAGNOSIS MICROSCOPIC EXAMINATION AND DIAGNOSIS  A. SENTINEL LYMPH NODE, RIGHT AXILLA, #1, EXCISION: One benign lymph node (0/1).  B. SENTINEL LYMPH NODE, RIGHT AXILLA, #2, EXCISION:  One benign lymph node (0/1).  C. BREAST, RIGHT, MASTECTOMY: Invasive ductal carcinoma, two foci, 0.3 and 0.2 cm, arising in background of ductal carcinoma in situ, intermediate-high nuclear grade, papillary growth pattern with necrosis and calcifications. Previous biopsy site changes. Margins negative for malignancy. Unremarkable skin. See comment and cancer protocol.  D. BREAST, LEFT, NEEDLE LOCALIZED LUMPECTOMY: Fibrocystic changes including stromal fibrosis, cysts formation, apocrine metaplasia, columnar cell alteration and usual ductal hyperplasia. Negative for atypia and carcinoma. Previous biopsy site changes. Microcalcifications present.   RADIOGRAPHIC STUDIES: I have personally reviewed the radiological images as listed and agreed with the findings in the report.  MG MAMMOGRAM DIAGNOSTIC LEFT10/04/2015  [facility]  Result Impression   No mammographic evidence of malignancy.  Breast composition: Almost entirely fat replaced.  BI-RADS Category 2 - Benign findings. Recommend routine follow-up.  RECOMMENDATIONS: Follow-up in 1 year.    ASSESSMENT:  70 yo postmenopausal African-American female  1. Right breast multifocal invasive ductal carcinoma, pT1aN0M0, stage IA, ER+/PR+, and background DCIS  -She is status post mastectomy, on adjuvant exemestane now. -I reviewed the natural history of early stage breast cancer. Given her small size of tumor, strong ER/PR positivity, her risk of cancer  recurrence is low. -I strongly encouraged her to continue exemastine, for total 5 years. Although we do have clinical data showing that 10 years adjuvant AI is better than 5 years, however giving her early stage of cancer, I think that 5 years of adjuvant therapy would be adequate. -We again reviewed the side effects of exemestane, she is tolerating well, except mild hot flash and drowsiness.. -Her last mammogram was unremarkable in October 2016. I palpated a small left axillary node on today's exam, we'll order a diagnostic mammogram with ultrasound for her to further evaluate. -Lab results reviewed with her, unremarkable. -We'll continue surveillance. I encourage her to continue calcium and vitamin D.   2. Iron deficient anemia -She had a study at [facility] in February 2016, which showed iron 19, ferritin 8, TIBC 460, and saturation 4%. This is consistent with iron deficient anemia. -Her anemia has resolved, hemoglobin improved significantly. -I encouraged her to continue oral iron, she is not very compliant. -She had EGD and colonoscopy at [facility] on 07/06/2015, I'll try to cut a copy of the report  3. Pulmonary nodules -She was found to have a 3 mm noncalcified nodule in the right lung base. Repeat CT scan on 06/08/2015 showed no change, and additional smaller calcified and noncalcified  nodules are all stable. -She has long-standing smoking history, but quit in May 2015.  4. HTN, CAD, asthma, GERD -She'll continue follow-up with her primary care physician  5. Bone health -We discussed that exemestane may weak her bone -I encouraged her to have a bone density scan, she will think about it. -I encouraged her to take calcium and vitamin D.  Plan -Continue exemestane, I refilled for her today -Diagnostic left mammogram in the next few weeks -I'll see her back in 4 months if mammogram negative    Truitt Merle, MD 01/11/2016 4:05 PM

## 2016-01-14 ENCOUNTER — Other Ambulatory Visit: Payer: Self-pay | Admitting: Hematology

## 2016-01-14 DIAGNOSIS — C50411 Malignant neoplasm of upper-outer quadrant of right female breast: Secondary | ICD-10-CM

## 2016-01-19 ENCOUNTER — Other Ambulatory Visit: Payer: Self-pay

## 2016-01-19 DIAGNOSIS — Z1231 Encounter for screening mammogram for malignant neoplasm of breast: Secondary | ICD-10-CM

## 2016-01-20 ENCOUNTER — Other Ambulatory Visit: Payer: Medicare Other

## 2016-01-24 ENCOUNTER — Ambulatory Visit: Payer: Medicare Other

## 2016-01-27 ENCOUNTER — Ambulatory Visit: Payer: Medicare Other

## 2016-02-09 ENCOUNTER — Ambulatory Visit
Admission: RE | Admit: 2016-02-09 | Discharge: 2016-02-09 | Disposition: A | Discharge status: Home / Self Care | Payer: Medicare Other | Source: Ambulatory Visit

## 2016-02-09 DIAGNOSIS — Z1231 Encounter for screening mammogram for malignant neoplasm of breast: Secondary | ICD-10-CM

## 2016-02-17 ENCOUNTER — Telehealth: Payer: Self-pay | Admitting: *Deleted

## 2016-02-17 ENCOUNTER — Telehealth: Payer: Self-pay | Admitting: Hematology

## 2016-02-17 NOTE — Telephone Encounter (Signed)
Pt called yest & left vm asking about her mammo report from 02/09/16.  Called & left message on pt's identified vm that mammo was negative, no malignancy noted & f/u in one year per report.

## 2016-02-17 NOTE — Telephone Encounter (Signed)
I called her about mammogram result (normal), she voiced good understanding.  Truitt Merle  02/17/2016

## 2016-04-04 ENCOUNTER — Telehealth: Payer: Self-pay | Admitting: Hematology

## 2016-04-04 NOTE — Telephone Encounter (Signed)
returned call and lvm for pt confirming appt °

## 2016-04-04 NOTE — Telephone Encounter (Signed)
pt came by to get copy of sch

## 2016-05-09 ENCOUNTER — Encounter: Payer: Self-pay | Admitting: Hematology

## 2016-05-09 ENCOUNTER — Other Ambulatory Visit: Payer: Medicare Other

## 2016-05-09 ENCOUNTER — Telehealth: Payer: Self-pay | Admitting: Hematology

## 2016-05-09 ENCOUNTER — Encounter: Payer: Medicare Other | Admitting: Hematology

## 2016-05-09 NOTE — Progress Notes (Signed)
This encounter was created in error - please disregard.

## 2016-05-09 NOTE — Telephone Encounter (Signed)
pt called to r/s appt due to family emergency....pt ok and aware of new d.t

## 2016-05-15 ENCOUNTER — Ambulatory Visit: Payer: Medicare Other | Admitting: Hematology

## 2016-05-15 ENCOUNTER — Telehealth: Payer: Self-pay | Admitting: Hematology

## 2016-05-15 ENCOUNTER — Other Ambulatory Visit: Payer: Medicare Other

## 2016-05-15 NOTE — Telephone Encounter (Signed)
pt cld to CX appt-stated death in family-will call to r/[place]

## 2016-05-30 ENCOUNTER — Telehealth: Payer: Self-pay | Admitting: Hematology

## 2016-05-30 NOTE — Telephone Encounter (Signed)
pt called to r/s cx appt....pt ok and aware of new d.t °

## 2016-06-06 ENCOUNTER — Telehealth: Payer: Self-pay | Admitting: Hematology

## 2016-06-06 NOTE — Telephone Encounter (Signed)
pt cld to get date/time of sch

## 2016-06-08 ENCOUNTER — Other Ambulatory Visit (HOSPITAL_BASED_OUTPATIENT_CLINIC_OR_DEPARTMENT_OTHER): Payer: Medicare Other

## 2016-06-08 ENCOUNTER — Ambulatory Visit (HOSPITAL_BASED_OUTPATIENT_CLINIC_OR_DEPARTMENT_OTHER): Payer: Medicare Other | Admitting: Hematology

## 2016-06-08 ENCOUNTER — Telehealth: Payer: Self-pay | Admitting: Hematology

## 2016-06-08 ENCOUNTER — Encounter: Payer: Self-pay | Admitting: Hematology

## 2016-06-08 VITALS — BP 156/82 | HR 68 | Temp 98.5°F | Resp 18 | Ht 67.0 in | Wt 186.5 lb

## 2016-06-08 DIAGNOSIS — D509 Iron deficiency anemia, unspecified: Secondary | ICD-10-CM

## 2016-06-08 DIAGNOSIS — D0511 Intraductal carcinoma in situ of right breast: Secondary | ICD-10-CM

## 2016-06-08 DIAGNOSIS — Z170 Estrogen receptor positive status [ER+]: Secondary | ICD-10-CM | POA: Diagnosis not present

## 2016-06-08 DIAGNOSIS — C50411 Malignant neoplasm of upper-outer quadrant of right female breast: Secondary | ICD-10-CM

## 2016-06-08 DIAGNOSIS — Z79811 Long term (current) use of aromatase inhibitors: Secondary | ICD-10-CM

## 2016-06-08 DIAGNOSIS — R918 Other nonspecific abnormal finding of lung field: Secondary | ICD-10-CM | POA: Diagnosis not present

## 2016-06-08 DIAGNOSIS — I10 Essential (primary) hypertension: Secondary | ICD-10-CM

## 2016-06-08 LAB — COMPREHENSIVE METABOLIC PANEL
ALT: 24 U/L (ref 0–55)
ANION GAP: 11 meq/L (ref 3–11)
AST: 27 U/L (ref 5–34)
Albumin: 3.5 g/dL (ref 3.5–5.0)
Alkaline Phosphatase: 82 U/L (ref 40–150)
BUN: 7.8 mg/dL (ref 7.0–26.0)
CALCIUM: 8.9 mg/dL (ref 8.4–10.4)
CHLORIDE: 110 meq/L — AB (ref 98–109)
CO2: 23 meq/L (ref 22–29)
CREATININE: 0.8 mg/dL (ref 0.6–1.1)
EGFR: 85 mL/min/{1.73_m2} — AB (ref >90)
Glucose: 93 mg/dl (ref 70–140)
Potassium: 3.4 mEq/L — ABNORMAL LOW (ref 3.5–5.1)
Sodium: 143 mEq/L (ref 136–145)
Total Bilirubin: 0.49 mg/dL (ref 0.20–1.20)
Total Protein: 6.8 g/dL (ref 6.4–8.3)

## 2016-06-08 LAB — CBC WITH DIFFERENTIAL/PLATELET
BASO%: 0.9 % (ref 0.0–2.0)
BASOS ABS: 0.1 10*3/uL (ref 0.0–0.1)
EOS ABS: 0.2 10*3/uL (ref 0.0–0.5)
EOS%: 2.3 % (ref 0.0–7.0)
HCT: 41 % (ref 34.8–46.6)
HGB: 13.8 g/dL (ref 11.6–15.9)
LYMPH%: 34.1 % (ref 14.0–49.7)
MCH: 31.4 pg (ref 25.1–34.0)
MCHC: 33.7 g/dL (ref 31.5–36.0)
MCV: 93.2 fL (ref 79.5–101.0)
MONO#: 0.5 10*3/uL (ref 0.1–0.9)
MONO%: 7.8 % (ref 0.0–14.0)
NEUT#: 3.8 10*3/uL (ref 1.5–6.5)
NEUT%: 54.9 % (ref 38.4–76.8)
PLATELETS: 272 10*3/uL (ref 145–400)
RBC: 4.4 10*6/uL (ref 3.70–5.45)
RDW: 13.2 % (ref 11.2–14.5)
WBC: 7 10*3/uL (ref 3.9–10.3)
lymph#: 2.4 10*3/uL (ref 0.9–3.3)

## 2016-06-08 MED ORDER — VENLAFAXINE HCL ER 37.5 MG PO CP24: 37.5000 mg | ORAL_CAPSULE | Freq: Every day | ORAL | Status: DC

## 2016-06-08 NOTE — Telephone Encounter (Signed)
Gave patient avs report and appointments for September.  °

## 2016-06-08 NOTE — Progress Notes (Signed)
[place] NOTE  Patient Care Team: Jearld Fenton, NP as PCP - General (Internal Medicine) Charolette Forward, MD as Consulting Physician (Cardiology) Danice Goltz Sorscher, MD (Hematology and Oncology)  CHIEF COMPLAINTS/PURPOSE OF CONSULTATION:  Follow up right breast cancer  Oncology History   Breast cancer of upper-outer quadrant of right female breast   Staging form: Breast, AJCC 7th Edition     Pathologic stage from 09/01/2014: Stage IA (T1a(m), N0, cM0) - Unsigned       Breast cancer of upper-outer quadrant of right female breast ([place])   05/21/2014 Initial Biopsy Right breast biopsy showed grade 2 DCIS with papillary features, ER/PR strongly positive.   05/21/2014 Receptors her2 ER 93%+, PR 100%   05/21/2014 Initial Diagnosis Breast cancer of upper-outer quadrant of right female breast   09/01/2014 Surgery Right mastectomy with sentinel lymph node biopsy, surgical margins were negative. Left lumpectomy showed fibrocystic changes.    10/11/2014 -  Anti-estrogen oral therapy Exemestane 25 mg once daily    HISTORY OF PRESENTING ILLNESS:  [name] 70 y.o. female with past medical history of stage I right breast cancer, presents to our clinic to transfer her oncology care to our cancer center.   The cancer was detected by screening mammogram in March 2015. The cancer was not palpable prior to diagnosis. She underwent initial core needle biopsy of the right breast mass, which showed grade 2 DCIS, ER PR positive. She initially was seen by surgeon Dr. Lucia Gaskins in [place], then she decided to transfer her care to [facility]. She underwent right breast mastectomy and sentinel lymph node, which showed 2 small focus of stage IA invasive ductal carcinoma, left breast lumpectomy showed benign fibrocystic changes. She did not have reconstruction. She subsequently started antiestrogen oral therapy with exemestane in November 2015. She has been under  Dr. Johnny Bridge care at [facility] in [place]. She lives in [place], and I would like to transfer her care to our cancer center which is close to home.  She is doing well overall. She has had hot flush at night for many years, which got slightly worse after she started exemestane, it wakes her up at night, but overall still manageable and tolerable. No joint or other complains. She has mild fatigue, appetite is moderate. Wight stable.   I reviewed her outside records extensively and collaborated the history with the patient.  CURRENT THERAPY: Exemestane 25 mg once daily  INTERIM HISTORY [place] returns for follow-up. She has been quite stressed by the death of two family members and one friend lately, and feels depressed. She is tolerating exemestane well overall, but has slightly worse in how flash, especially at night. No significant arthralgia or other side effects. She still has intermittent shooting pain at the mastectomy site. She otherwise is doing well no other new complaints.  MEDICAL HISTORY:  Past Medical History  Diagnosis Date  . Hypertension   . Myocardial infarction ([place])   . Allergic rhinitis   . Asthma   . Arthritis   . Emphysema of lung ([place])   . Coronary artery disease   . Anginal pain ([place])   . GERD (gastroesophageal reflux disease)   . Lupus (systemic lupus erythematosus) ([place])     SURGICAL HISTORY: Past Surgical History  Procedure Laterality Date  . Coronary stent placement  1995    SOCIAL HISTORY: Social History   Social History  . Marital Status: Divorced    Spouse Name: N/A  . Number  of Children: N/A  . Years of Education: 12   Occupational History  . retired from the school Occupational psychologist   Social History Main Topics  . Smoking status: Former Smoker -- 0.50 packs/day for 50 years    Types: Cigarettes    Quit date: 04/10/2014  . Smokeless tobacco: Never Used  . Alcohol Use: Yes     Comment: occassional  . Drug Use: No  .  Sexual Activity: Yes    Birth Control/ Protection: Post-menopausal   Other Topics Concern  . Not on file   Social History Narrative   Pt is divorced and lives alone.    Regular exercise-no   Caffeine Use-yes    FAMILY HISTORY: Family History  Problem Relation Age of Onset  . Allergies Mother   . Heart disease Mother   . Diabetes Mother   . Hypertension Mother   . Cancer Father     Lung Cancer  . Cancer Sister 40    lung cancer   . Cancer Maternal Aunt 78    colon cancer   . Cancer Maternal Aunt 90    uterine cancer     ALLERGIES:  is allergic to lipitor.  MEDICATIONS:  Current Outpatient Prescriptions  Medication Sig Dispense Refill  . ALPRAZolam (XANAX) 0.25 MG tablet Take 0.25 mg by mouth 2 (two) times daily as needed.     Marland Kitchen amLODipine (NORVASC) 5 MG tablet Take 5 mg by mouth daily.    Marland Kitchen aspirin EC 81 MG EC tablet Take 1 tablet (81 mg total) by mouth daily. 30 tablet 3  . exemestane (AROMASIN) 25 MG tablet Take 1 tablet (25 mg total) by mouth daily. 90 tablet 1  . isosorbide mononitrate (IMDUR) 30 MG 24 hr tablet Take 30 mg by mouth daily.      . metoprolol succinate (TOPROL-XL) 50 MG 24 hr tablet Take 50 mg by mouth daily. Take with or immediately following a meal.    . pantoprazole (PROTONIX) 40 MG tablet Take 40 mg by mouth daily.  3  . pravastatin (PRAVACHOL) 40 MG tablet Take 40 mg by mouth at bedtime.    . ramipril (ALTACE) 5 MG capsule Take 10 mg by mouth daily.      . ranitidine (ZANTAC) 300 MG tablet Take 1 tablet by mouth at bedtime.  5  . nitroGLYCERIN (NITROSTAT) 0.4 MG SL tablet Place 0.4 mg under the tongue every 5 (five) minutes as needed. Reported on 01/11/2016    . venlafaxine XR (EFFEXOR XR) 37.5 MG 24 hr capsule Take 1 capsule (37.5 mg total) by mouth daily with breakfast. 60 capsule 1   No current facility-administered medications for this visit.    REVIEW OF SYSTEMS:   Constitutional: Denies fevers, chills or abnormal night sweats Eyes: Denies  blurriness of vision, double vision or watery eyes Ears, nose, mouth, throat, and face: Denies mucositis or sore throat Respiratory: Denies cough, dyspnea or wheezes Cardiovascular: Denies palpitation, chest discomfort or lower extremity swelling Gastrointestinal:  Denies nausea, heartburn or change in bowel habits Skin: Denies abnormal skin rashes Lymphatics: Denies new lymphadenopathy or easy bruising Neurological:Denies numbness, tingling or new weaknesses Behavioral/Psych: Mood is stable, no new changes  All other systems were reviewed with the patient and are negative.  PHYSICAL EXAMINATION: ECOG PERFORMANCE STATUS: 0 - Asymptomatic  Filed Vitals:   06/08/16 1404  BP: 156/82  Pulse: 68  Temp: 98.5 F (36.9 C)  Resp: 18   Filed Weights  06/08/16 1404  Weight: 186 lb 8 oz (84.596 kg)    GENERAL:alert, no distress and comfortable SKIN: skin color, texture, turgor are normal, no rashes or significant lesions EYES: normal, conjunctiva are pink and non-injected, sclera clear OROPHARYNX:no exudate, no erythema and lips, buccal mucosa, and tongue normal  NECK: supple, thyroid normal size, non-tender, without nodularity LYMPH:  no palpable lymphadenopathy in the cervical, axillary or inguinal LUNGS: clear to auscultation and percussion with normal breathing effort HEART: regular rate & rhythm and no murmurs and no lower extremity edema ABDOMEN:abdomen soft, non-tender and normal bowel sounds Musculoskeletal:no cyanosis of digits and no clubbing  PSYCH: alert & oriented x 3 with fluent speech NEURO: no focal motor/sensory deficits. Breasts: s/p right mastectomy, surgical scar well healed. No palpable mass in the right chest wall or axilla.  Palpation of the left breasts and axilla revealed a smooth, movable small node in left axilla, no obvious mass in breast, the surgical scar in left breast is well-healed.    LABORATORY DATA:  I have reviewed the data as listed CBC Latest  Ref Rng 06/08/2016 01/05/2016 09/10/2015  WBC 3.9 - 10.3 10e3/uL 7.0 6.1 7.0  Hemoglobin 11.6 - 15.9 g/dL 13.8 14.0 13.0  Hematocrit 34.8 - 46.6 % 41.0 42.9 40.1  Platelets 145 - 400 10e3/uL 272 259 307    CMP Latest Ref Rng 06/08/2016 01/05/2016 09/10/2015  Glucose 70 - 140 mg/dl 93 100 94  BUN 7.0 - 26.0 mg/dL 7.8 10.5 9.3  Creatinine 0.6 - 1.1 mg/dL 0.8 0.9 0.8  Sodium 136 - 145 mEq/L 143 142 145  Potassium 3.5 - 5.1 mEq/L 3.4(L) 3.7 4.1  CO2 22 - 29 mEq/L '23 22 24  ' Calcium 8.4 - 10.4 mg/dL 8.9 9.2 9.1  Total Protein 6.4 - 8.3 g/dL 6.8 7.4 6.9  Total Bilirubin 0.20 - 1.20 mg/dL 0.49 0.53 0.36  Alkaline Phos 40 - 150 U/L 82 81 78  AST 5 - 34 U/L 27 29 33  ALT 0 - 55 U/L '24 28 25    ' PATHOLOGY REPORT Pathology: ACCESSION NUMBER: [id number] RECEIVED: 09/01/2014 ORDERING PHYSICIAN: MARISSA HOWARD-MCNATT , MD PATIENT NAME: [name], [name] SURGICAL PATHOLOGY REPORT  FINAL PATHOLOGIC DIAGNOSIS MICROSCOPIC EXAMINATION AND DIAGNOSIS  A. SENTINEL LYMPH NODE, RIGHT AXILLA, #1, EXCISION: One benign lymph node (0/1).  B. SENTINEL LYMPH NODE, RIGHT AXILLA, #2, EXCISION:  One benign lymph node (0/1).  C. BREAST, RIGHT, MASTECTOMY: Invasive ductal carcinoma, two foci, 0.3 and 0.2 cm, arising in background of ductal carcinoma in situ, intermediate-high nuclear grade, papillary growth pattern with necrosis and calcifications. Previous biopsy site changes. Margins negative for malignancy. Unremarkable skin. See comment and cancer protocol.  D. BREAST, LEFT, NEEDLE LOCALIZED LUMPECTOMY: Fibrocystic changes including stromal fibrosis, cysts formation, apocrine metaplasia, columnar cell alteration and usual ductal hyperplasia. Negative for atypia and carcinoma. Previous biopsy site changes. Microcalcifications present.   RADIOGRAPHIC STUDIES: I have personally reviewed the radiological images as listed and agreed with the  findings in the report.  MG MAMMOGRAM SCREENING LEFT 02/11/2016 IMPRESSION: No mammographic evidence of malignancy. A result letter of this screening mammogram will be mailed directly to the patient.  RECOMMENDATION: Screening mammogram in one year. (Code:SM-B-01Y)   ASSESSMENT:  70 yo postmenopausal African-American female  1. Right breast multifocal invasive ductal carcinoma, pT1aN0M0, stage IA, ER+/PR+, and background DCIS  -She is status post mastectomy, on adjuvant exemestane now. -I reviewed the natural history of early stage breast cancer. Given her small size of tumor, strong ER/PR positivity, her  risk of cancer recurrence is low. -I strongly encouraged her to continue exemastine, for total 5 years. Although we do have clinical data showing that 10 years adjuvant AI is better than 5 years, however giving her early stage of cancer, I think that 5 years of adjuvant therapy would be adequate. -We again reviewed the side effects of exemestane, she is tolerating well, will hot flushes has been getting worse.  -She is clinically doing well overall, exam is unremarkable today, last mammogram was negative in March 2017 -Lab results reviewed with her, unremarkable. -We'll continue surveillance. I encourage her to continue calcium and vitamin D.   2. Hot flush -Secondary to exemestane -I recommend her to try Effexor for hot flash and also moderate depression. Potential benefits and side effects were discussed with her, she agrees to try. I recommend her to start at a low dose at 37.56m daily, if she tolerates well, then increase to 75 mg daily.  3. Iron deficient anemia -[facility] iron 19, ferritin 8, TIBC 460, and saturation 4%. This is consistent with iron deficient anemia. -Her anemia has resolved, hemoglobin improved significantly. -I encouraged her to continue oral iron, she is not very compliant. -[facility], I'll try to cut a copy of the report  4. Pulmonary nodules -She was found to have a 3 mm noncalcified nodule in the right lung base. Repeat CT scan on 06/08/2015 showed no change, and additional smaller calcified and noncalcified nodules are all stable. -She has long-standing smoking history, but quit in May 2015.  5. HTN, CAD, asthma, GERD -She'll continue follow-up with her primary care physician  6. Bone health -We discussed that exemestane may weak her bone -I encouraged her to have a bone density scan, she will think about it. -I encouraged her to take calcium and vitamin D.  Plan -Continue exemestane -Start Effexor 37.5 mg daily, if tolerates well, increased to 75 mg daily in 2 weeks -I'll see her back in 3 months for follow up     FTruitt Merle MD 06/08/2016 4:24 PM

## 2016-09-07 ENCOUNTER — Other Ambulatory Visit: Payer: Medicare Other

## 2016-09-07 ENCOUNTER — Ambulatory Visit: Payer: Medicare Other | Admitting: Hematology

## 2016-09-18 ENCOUNTER — Encounter (HOSPITAL_COMMUNITY): Payer: Self-pay

## 2016-09-18 ENCOUNTER — Emergency Department (HOSPITAL_COMMUNITY)
Admission: EM | Admit: 2016-09-18 | Discharge: 2016-09-18 | Disposition: A | Discharge status: Home / Self Care | Payer: Medicare Other | Attending: Emergency Medicine | Admitting: Emergency Medicine

## 2016-09-18 DIAGNOSIS — Y929 Unspecified place or not applicable: Secondary | ICD-10-CM | POA: Insufficient documentation

## 2016-09-18 DIAGNOSIS — Z955 Presence of coronary angioplasty implant and graft: Secondary | ICD-10-CM | POA: Diagnosis not present

## 2016-09-18 DIAGNOSIS — Y999 Unspecified external cause status: Secondary | ICD-10-CM | POA: Diagnosis not present

## 2016-09-18 DIAGNOSIS — S00512A Abrasion of oral cavity, initial encounter: Secondary | ICD-10-CM | POA: Diagnosis not present

## 2016-09-18 DIAGNOSIS — Z7982 Long term (current) use of aspirin: Secondary | ICD-10-CM | POA: Insufficient documentation

## 2016-09-18 DIAGNOSIS — J45909 Unspecified asthma, uncomplicated: Secondary | ICD-10-CM | POA: Diagnosis not present

## 2016-09-18 DIAGNOSIS — Z853 Personal history of malignant neoplasm of breast: Secondary | ICD-10-CM | POA: Insufficient documentation

## 2016-09-18 DIAGNOSIS — I252 Old myocardial infarction: Secondary | ICD-10-CM | POA: Insufficient documentation

## 2016-09-18 DIAGNOSIS — I2510 Atherosclerotic heart disease of native coronary artery without angina pectoris: Secondary | ICD-10-CM | POA: Diagnosis not present

## 2016-09-18 DIAGNOSIS — S0993XA Unspecified injury of face, initial encounter: Secondary | ICD-10-CM | POA: Diagnosis present

## 2016-09-18 DIAGNOSIS — I10 Essential (primary) hypertension: Secondary | ICD-10-CM | POA: Insufficient documentation

## 2016-09-18 DIAGNOSIS — X58XXXA Exposure to other specified factors, initial encounter: Secondary | ICD-10-CM | POA: Diagnosis not present

## 2016-09-18 DIAGNOSIS — Y939 Activity, unspecified: Secondary | ICD-10-CM | POA: Insufficient documentation

## 2016-09-18 DIAGNOSIS — Z87891 Personal history of nicotine dependence: Secondary | ICD-10-CM | POA: Insufficient documentation

## 2016-09-18 LAB — CBC WITH DIFFERENTIAL/PLATELET
Basophils Absolute: 0 K/uL (ref 0.0–0.1)
Basophils Relative: 1 %
Eosinophils Absolute: 0.1 K/uL (ref 0.0–0.7)
Eosinophils Relative: 1 %
HCT: 42.4 % (ref 36.0–46.0)
Hemoglobin: 13.9 g/dL (ref 12.0–15.0)
Lymphocytes Relative: 30 %
Lymphs Abs: 1.9 K/uL (ref 0.7–4.0)
MCH: 31.2 pg (ref 26.0–34.0)
MCHC: 32.8 g/dL (ref 30.0–36.0)
MCV: 95.3 fL (ref 78.0–100.0)
Monocytes Absolute: 0.4 K/uL (ref 0.1–1.0)
Monocytes Relative: 6 %
Neutro Abs: 3.9 K/uL (ref 1.7–7.7)
Neutrophils Relative %: 62 %
Platelets: 247 K/uL (ref 150–400)
RBC: 4.45 MIL/uL (ref 3.87–5.11)
RDW: 13.1 % (ref 11.5–15.5)
WBC: 6.3 K/uL (ref 4.0–10.5)

## 2016-09-18 LAB — BASIC METABOLIC PANEL WITH GFR
Anion gap: 10 (ref 5–15)
BUN: <5 mg/dL — ABNORMAL LOW (ref 6–20)
CO2: 22 mmol/L (ref 22–32)
Calcium: 9.4 mg/dL (ref 8.9–10.3)
Chloride: 107 mmol/L (ref 101–111)
Creatinine, Ser: 0.75 mg/dL (ref 0.44–1.00)
GFR calc Af Amer: >60 mL/min
GFR calc non Af Amer: >60 mL/min
Glucose, Bld: 106 mg/dL — ABNORMAL HIGH (ref 65–99)
Potassium: 3.4 mmol/L — ABNORMAL LOW (ref 3.5–5.1)
Sodium: 139 mmol/L (ref 135–145)

## 2016-09-18 NOTE — Discharge Instructions (Signed)
Start using a peridontal disease toothpaste.

## 2016-09-18 NOTE — ED Triage Notes (Signed)
Onset the first time July then August and appeared again 2 days ago upper front gum bleeding. Patient has dentures. States used Ice to control bleeding.  Concerned keeps bleeding intermittently. Denies trauma.

## 2016-09-18 NOTE — ED Provider Notes (Signed)
[facility] DEPT Provider Note   CSN: [id number] Arrival date & time: 09/18/16  1518     History   Chief Complaint Chief Complaint  Patient presents with  . Oral Swelling    HPI [name] is a 70 y.o. female.  70 year old female with past medical history below who presents with calm bleeding. The patient states that one time in July, 1 time in August, 2 days ago, and again today she had sudden onset of spontaneous bleeding of her upper gums. She noted that her mouth was full blood but she denies any trauma. She states that she has had the same dentures for a long time and they do not fit well. No history of bleeding problems and no anticoagulant use.   The history is provided by the patient.    Past Medical History:  Diagnosis Date  . Allergic rhinitis   . Anginal pain ([place])   . Arthritis   . Asthma   . Coronary artery disease   . Emphysema of lung ([place])   . GERD (gastroesophageal reflux disease)   . Hypertension   . Lupus (systemic lupus erythematosus) ([place])   . Myocardial infarction     Patient Active Problem List   Diagnosis Date Noted  . Breast cancer of upper-outer quadrant of right female breast ([place]) 05/28/2014  . Abnormal mammogram with microcalcification 04/14/2014  . Unspecified essential hypertension 01/20/2013  . Persistent disorder of initiating or maintaining sleep 04/03/2012    Past Surgical History:  Procedure Laterality Date  . CORONARY STENT PLACEMENT  1995    OB History    No data available       Home Medications    Prior to Admission medications   Medication Sig Start Date End Date Taking? Authorizing Provider  ALPRAZolam (XANAX) 0.25 MG tablet Take 0.25 mg by mouth 2 (two) times daily as needed.    Yes Historical Provider, MD  amLODipine (NORVASC) 5 MG tablet Take 5 mg by mouth daily. 05/25/14  Yes Historical Provider, MD  aspirin EC 81 MG EC tablet Take 1 tablet (81 mg total) by mouth daily. 03/14/13  Yes Charolette Forward, MD    exemestane (AROMASIN) 25 MG tablet Take 1 tablet (25 mg total) by mouth daily. 01/11/16  Yes Truitt Merle, MD  isosorbide mononitrate (IMDUR) 30 MG 24 hr tablet Take 30 mg by mouth daily.     Yes Historical Provider, MD  metoprolol succinate (TOPROL-XL) 50 MG 24 hr tablet Take 50 mg by mouth daily. Take with or immediately following a meal.   Yes Historical Provider, MD  nitroGLYCERIN (NITROSTAT) 0.4 MG SL tablet Place 0.4 mg under the tongue every 5 (five) minutes as needed. Reported on 01/11/2016   Yes Historical Provider, MD  pantoprazole (PROTONIX) 40 MG tablet Take 40 mg by mouth daily. 06/01/15  Yes Historical Provider, MD  ramipril (ALTACE) 10 MG capsule Take 10 mg by mouth 2 (two) times daily.    Yes Historical Provider, MD  pravastatin (PRAVACHOL) 40 MG tablet Take 40 mg by mouth at bedtime.    Historical Provider, MD  ranitidine (ZANTAC) 300 MG tablet Take 1 tablet by mouth at bedtime. 05/19/15   Historical Provider, MD  venlafaxine XR (EFFEXOR XR) 37.5 MG 24 hr capsule Take 1 capsule (37.5 mg total) by mouth daily with breakfast. Patient not taking: Reported on 09/18/2016 06/08/16   Truitt Merle, MD    Family History Family History  Problem Relation Age of Onset  . Allergies Mother   .  Heart disease Mother   . Diabetes Mother   . Hypertension Mother   . Cancer Father     Lung Cancer  . Cancer Sister 51    lung cancer   . Cancer Maternal Aunt 78    colon cancer   . Cancer Maternal Aunt 90    uterine cancer     Social History Social History  Substance Use Topics  . Smoking status: Former Smoker    Packs/day: 0.50    Years: 50.00    Types: Cigarettes    Quit date: 04/10/2014  . Smokeless tobacco: Never Used  . Alcohol use Yes     Comment: occassional     Allergies   Lipitor [atorvastatin]   Review of Systems Review of Systems 10 Systems reviewed and are negative for acute change except as noted in the HPI.   Physical Exam Updated Vital Signs BP 159/73   Pulse (!) 59    Temp 98.6 F (37 C) (Oral)   Resp 16   Ht 5\' 6"  (1.676 m)   Wt 182 lb (82.6 kg)   SpO2 97%   BMI 29.38 kg/m   Physical Exam  Constitutional: She is oriented to person, place, and time. She appears well-developed and well-nourished. No distress.  HENT:  Head: Normocephalic and atraumatic.  Nose: Nose normal.  Mouth/Throat: No oropharyngeal exudate.  Small abrasion on midline upper gum where central incisors would be, no active bleeding, mild tenderness  Eyes: Conjunctivae are normal.  Neck: Neck supple.  Neurological: She is alert and oriented to person, place, and time.  Skin: Skin is warm and dry.  Psychiatric: She has a normal mood and affect. Judgment normal.  Nursing note and vitals reviewed.    ED Treatments / Results  Labs (all labs ordered are listed, but only abnormal results are displayed) Labs Reviewed  BASIC METABOLIC PANEL - Abnormal; Notable for the following:       Result Value   Potassium 3.4 (*)    Glucose, Bld 106 (*)    BUN <5 (*)    All other components within normal limits  CBC WITH DIFFERENTIAL/PLATELET    EKG  EKG Interpretation None       Radiology No results found.  Procedures Procedures (including critical care time)  Medications Ordered in ED Medications - No data to display   Initial Impression / Assessment and Plan / ED Course  I have reviewed the triage vital signs and the nursing notes.   Clinical Course   Pt w/ small abrasion on top of upper gums likely from rubbing against poorly fitting dentures. Instructed to f/u w/ place where she has obtained dentures in past for re-fitting. All questions answered.  Final Clinical Impressions(s) / ED Diagnoses   Final diagnoses:  Abrasion of upper gingiva, initial encounter    New Prescriptions Discharge Medication List as of 09/18/2016  7:40 PM       Sharlett Iles, MD 09/18/16 2008

## 2016-09-22 ENCOUNTER — Other Ambulatory Visit (HOSPITAL_BASED_OUTPATIENT_CLINIC_OR_DEPARTMENT_OTHER): Payer: Medicare Other

## 2016-09-22 ENCOUNTER — Ambulatory Visit (HOSPITAL_BASED_OUTPATIENT_CLINIC_OR_DEPARTMENT_OTHER): Payer: Medicare Other | Admitting: Hematology

## 2016-09-22 ENCOUNTER — Encounter: Payer: Self-pay | Admitting: Hematology

## 2016-09-22 ENCOUNTER — Telehealth: Payer: Self-pay | Admitting: Hematology

## 2016-09-22 VITALS — BP 138/76 | HR 64 | Temp 98.5°F | Resp 18 | Ht 66.0 in | Wt 189.0 lb

## 2016-09-22 DIAGNOSIS — Z23 Encounter for immunization: Secondary | ICD-10-CM | POA: Diagnosis not present

## 2016-09-22 DIAGNOSIS — C50411 Malignant neoplasm of upper-outer quadrant of right female breast: Secondary | ICD-10-CM

## 2016-09-22 DIAGNOSIS — D509 Iron deficiency anemia, unspecified: Secondary | ICD-10-CM | POA: Diagnosis not present

## 2016-09-22 DIAGNOSIS — D0511 Intraductal carcinoma in situ of right breast: Secondary | ICD-10-CM

## 2016-09-22 DIAGNOSIS — N951 Menopausal and female climacteric states: Secondary | ICD-10-CM | POA: Diagnosis not present

## 2016-09-22 DIAGNOSIS — Z79811 Long term (current) use of aromatase inhibitors: Secondary | ICD-10-CM

## 2016-09-22 DIAGNOSIS — R918 Other nonspecific abnormal finding of lung field: Secondary | ICD-10-CM

## 2016-09-22 DIAGNOSIS — Z170 Estrogen receptor positive status [ER+]: Secondary | ICD-10-CM

## 2016-09-22 DIAGNOSIS — I10 Essential (primary) hypertension: Secondary | ICD-10-CM

## 2016-09-22 MED ORDER — INFLUENZA VAC SPLIT QUAD 0.5 ML IM SUSY
0.5000 mL | PREFILLED_SYRINGE | Freq: Once | INTRAMUSCULAR | Status: AC
  Administered 2016-09-22: 0.5 mL via INTRAMUSCULAR
  Filled 2016-09-22: qty 0.5

## 2016-09-22 NOTE — Progress Notes (Signed)
[facility] NOTE  Patient Care Team: Ana Fenton, NP as PCP - General (Internal Medicine) Charolette Forward, MD as Consulting Physician (Cardiology) Danice Goltz Sorscher, MD (Hematology and Oncology)  CHIEF COMPLAINTS:  Follow up right breast cancer  Oncology History   Breast cancer of upper-outer quadrant of right female breast   Staging form: Breast, AJCC 7th Edition     Pathologic stage from 09/01/2014: Stage IA (T1a(m), N0, cM0) - Unsigned       Breast cancer of upper-outer quadrant of right female breast ([place])   05/21/2014 Initial Biopsy    Right breast biopsy showed grade 2 DCIS with papillary features, ER/PR strongly positive.      05/21/2014 Receptors her2    ER 93%+, PR 100%      05/21/2014 Initial Diagnosis    Breast cancer of upper-outer quadrant of right female breast      09/01/2014 Surgery    Right mastectomy with sentinel lymph node biopsy, surgical margins were negative. Left lumpectomy showed fibrocystic changes.       10/11/2014 -  Anti-estrogen oral therapy    Exemestane 25 mg once daily       HISTORY OF PRESENTING ILLNESS:  [name] 70 y.o. female with past medical history of stage I right breast cancer, presents to our clinic to transfer her oncology care to our cancer center.   The cancer was detected by screening mammogram in March 2015. The cancer was not palpable prior to diagnosis. She underwent initial core needle biopsy of the right breast mass, which showed grade 2 DCIS, ER PR positive. She initially was seen by surgeon Dr. Lucia Gaskins in [place], then she decided to transfer her care to [facility]. She underwent right breast mastectomy and sentinel lymph node, which showed 2 small focus of stage IA invasive ductal carcinoma, left breast lumpectomy showed benign fibrocystic changes. She did not have reconstruction. She subsequently started antiestrogen oral therapy with exemestane in November 2015.  She has been under Dr. Johnny Bridge care at [facility] in [place]. She lives in [place], and I would like to transfer her care to our cancer center which is close to home.  She is doing well overall. She has had hot flush at night for many years, which got slightly worse after she started exemestane, it wakes her up at night, but overall still manageable and tolerable. No joint or other complains. She has mild fatigue, appetite is moderate. Wight stable.   I reviewed her outside records extensively and collaborated the history with the patient.  CURRENT THERAPY: Exemestane 25 mg once daily  INTERIM HISTORY Zina returns for follow-up. She is doing well overall. She has been compliant with exemestane, and tolerating well overall. Her hot flash has improved, she did not try Effexor, but did not like it and it stopped. She denies any other noticeable side effects. She has good appetite and energy level, remains to be physically active. She had ED visit for her gum bleeding from her denture last week, resolved and now.  MEDICAL HISTORY:  Past Medical History:  Diagnosis Date  . Allergic rhinitis   . Anginal pain ([place])   . Arthritis   . Asthma   . Coronary artery disease   . Emphysema of lung ([place])   . GERD (gastroesophageal reflux disease)   . Hypertension   . Lupus (systemic lupus erythematosus) ([place])   . Myocardial infarction     SURGICAL HISTORY: Past Surgical History:  Procedure  Laterality Date  . CORONARY STENT PLACEMENT  1995    SOCIAL HISTORY: Social History   Social History  . Marital status: Divorced    Spouse name: N/A  . Number of children: N/A  . Years of education: 51   Occupational History  . 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 Smoking status: Former Smoker    Packs/day: 0.50    Years: 50.00    Types: Cigarettes    Quit date: 04/10/2014  . Smokeless tobacco: Never Used  . Alcohol use Yes      Comment: occassional  . Drug use: No  . Sexual activity: Yes    Birth control/ protection: Post-menopausal   Other Topics Concern  . Not on file   Social History Narrative   Pt is divorced and lives alone.    Regular exercise-no   Caffeine Use-yes    FAMILY HISTORY: Family History  Problem Relation Age of Onset  . Allergies Mother   . Heart disease Mother   . Diabetes Mother   . Hypertension Mother   . Cancer Father     Lung Cancer  . Cancer Sister 63    lung cancer   . Cancer Maternal Aunt 78    colon cancer   . Cancer Maternal Aunt 90    uterine cancer     ALLERGIES:  is allergic to lipitor [atorvastatin].  MEDICATIONS:  Current Outpatient Prescriptions  Medication Sig Dispense Refill  . ALPRAZolam (XANAX) 0.25 MG tablet Take 0.25 mg by mouth 2 (two) times daily as needed.     Marland Kitchen amLODipine (NORVASC) 5 MG tablet Take 5 mg by mouth daily.    Marland Kitchen aspirin EC 81 MG EC tablet Take 1 tablet (81 mg total) by mouth daily. 30 tablet 3  . exemestane (AROMASIN) 25 MG tablet Take 1 tablet (25 mg total) by mouth daily. 90 tablet 1  . isosorbide mononitrate (IMDUR) 30 MG 24 hr tablet Take 30 mg by mouth daily.      . metoprolol succinate (TOPROL-XL) 50 MG 24 hr tablet Take 50 mg by mouth daily. Take with or immediately following a meal.    . nitroGLYCERIN (NITROSTAT) 0.4 MG SL tablet Place 0.4 mg under the tongue every 5 (five) minutes as needed. Reported on 01/11/2016    . pantoprazole (PROTONIX) 40 MG tablet Take 40 mg by mouth daily.  3  . pravastatin (PRAVACHOL) 40 MG tablet Take 40 mg by mouth at bedtime.    . ramipril (ALTACE) 10 MG capsule Take 10 mg by mouth 2 (two) times daily.     . ranitidine (ZANTAC) 300 MG tablet Take 1 tablet by mouth at bedtime.  5  . venlafaxine XR (EFFEXOR XR) 37.5 MG 24 hr capsule Take 1 capsule (37.5 mg total) by mouth daily with breakfast. 60 capsule 1   No current facility-administered medications for this visit.     REVIEW OF SYSTEMS:    Constitutional: Denies fevers, chills or abnormal night sweats Eyes: Denies blurriness of vision, double vision or watery eyes Ears, nose, mouth, throat, and face: Denies mucositis or sore throat Respiratory: Denies cough, dyspnea or wheezes Cardiovascular: Denies palpitation, chest discomfort or lower extremity swelling Gastrointestinal:  Denies nausea, heartburn or change in bowel habits Skin: Denies abnormal skin rashes Lymphatics: Denies new lymphadenopathy or easy bruising Neurological:Denies numbness, tingling or new weaknesses Behavioral/Psych: Mood is stable, no new changes  All other systems were reviewed  with the patient and are negative.  PHYSICAL EXAMINATION: ECOG PERFORMANCE STATUS: 0 - Asymptomatic  Vitals:   09/22/16 0949  BP: 138/76  Pulse: 64  Resp: 18  Temp: 98.5 F (36.9 C)   Filed Weights   09/22/16 0949  Weight: 189 lb (85.7 kg)    GENERAL:alert, no distress and comfortable SKIN: skin color, texture, turgor are normal, no rashes or significant lesions EYES: normal, conjunctiva are pink and non-injected, sclera clear OROPHARYNX:no exudate, no erythema and lips, buccal mucosa, and tongue normal  NECK: supple, thyroid normal size, non-tender, without nodularity LYMPH:  no palpable lymphadenopathy in the cervical, axillary or inguinal LUNGS: clear to auscultation and percussion with normal breathing effort HEART: regular rate & rhythm and no murmurs and no lower extremity edema ABDOMEN:abdomen soft, non-tender and normal bowel sounds Musculoskeletal:no cyanosis of digits and no clubbing  PSYCH: alert & oriented x 3 with fluent speech NEURO: no focal motor/sensory deficits. Breasts: s/p right mastectomy, surgical scar well healed. No palpable mass in the right chest wall or axilla.  Palpation of the left breasts and axilla revealed a smooth, movable small node in left axilla, no obvious mass in breast, the surgical scar in left breast is well-healed.     LABORATORY DATA:  I have reviewed the data as listed CBC Latest Ref Rng & Units 09/18/2016 06/08/2016 01/05/2016  WBC 4.0 - 10.5 K/uL 6.3 7.0 6.1  Hemoglobin 12.0 - 15.0 g/dL 13.9 13.8 14.0  Hematocrit 36.0 - 46.0 % 42.4 41.0 42.9  Platelets 150 - 400 K/uL 247 272 259    CMP Latest Ref Rng & Units 09/18/2016 06/08/2016 01/05/2016  Glucose 65 - 99 mg/dL 106(H) 93 100  BUN 6 - 20 mg/dL <5(L) 7.8 10.5  Creatinine 0.44 - 1.00 mg/dL 0.75 0.8 0.9  Sodium 135 - 145 mmol/L 139 143 142  Potassium 3.5 - 5.1 mmol/L 3.4(L) 3.4(L) 3.7  Chloride 101 - 111 mmol/L 107 - -  CO2 22 - 32 mmol/L '22 23 22  ' Calcium 8.9 - 10.3 mg/dL 9.4 8.9 9.2  Total Protein 6.4 - 8.3 g/dL - 6.8 7.4  Total Bilirubin 0.20 - 1.20 mg/dL - 0.49 0.53  Alkaline Phos 40 - 150 U/L - 82 81  AST 5 - 34 U/L - 27 29  ALT 0 - 55 U/L - 24 28    PATHOLOGY REPORT Pathology: ACCESSION NUMBER: [id number] RECEIVED: 09/01/2014 ORDERING PHYSICIAN: MARISSA HOWARD-MCNATT , MD PATIENT NAME: Batterman, Jacarra SURGICAL PATHOLOGY REPORT  FINAL PATHOLOGIC DIAGNOSIS MICROSCOPIC EXAMINATION AND DIAGNOSIS  A. SENTINEL LYMPH NODE, RIGHT AXILLA, #1, EXCISION: One benign lymph node (0/1).  B. SENTINEL LYMPH NODE, RIGHT AXILLA, #2, EXCISION:  One benign lymph node (0/1).  C. BREAST, RIGHT, MASTECTOMY: Invasive ductal carcinoma, two foci, 0.3 and 0.2 cm, arising in background of ductal carcinoma in situ, intermediate-high nuclear grade, papillary growth pattern with necrosis and calcifications. Previous biopsy site changes. Margins negative for malignancy. Unremarkable skin. See comment and cancer protocol.  D. BREAST, LEFT, NEEDLE LOCALIZED LUMPECTOMY: Fibrocystic changes including stromal fibrosis, cysts formation, apocrine metaplasia, columnar cell alteration and usual ductal hyperplasia. Negative for atypia and carcinoma. Previous biopsy site changes. Microcalcifications  present.   RADIOGRAPHIC STUDIES: I have personally reviewed the radiological images as listed and agreed with the findings in the report.  MG MAMMOGRAM SCREENING LEFT 02/11/2016 IMPRESSION: No mammographic evidence of malignancy. A result letter of this screening mammogram will be mailed directly to the patient.  RECOMMENDATION: Screening mammogram in one year. (Code:SM-B-01Y)  ASSESSMENT:  70 yo postmenopausal African-American female  1. Right breast multifocal invasive ductal carcinoma, pT1aN0M0, stage IA, ER+/PR+, and background DCIS  -She is status post mastectomy, on adjuvant exemestane now. -I reviewed the natural history of early stage breast cancer. Given her small size of tumor, strong ER/PR positivity, her risk of cancer recurrence is low. -I strongly encouraged her to continue exemastine, for total 5 years. Although we do have clinical data showing that 10 years adjuvant AI is better than 5 years, however giving her early stage of cancer, I think that 5 years of adjuvant therapy would be adequate. -We again reviewed the side effects of exemestane, she is tolerating well, will hot flushes has been getting worse.  -She is clinically doing well overall, exam is unremarkable today, last mammogram was negative in March 2017 -Lab results reviewed with her, unremarkable. -We'll continue surveillance. I encourage her to continue calcium and vitamin D, and remains to be physically active  2. Hot flush -Secondary to exemestane -Improved, manageable. -She didn't try Effexor, but did not like it and stopped.  3. Iron deficient anemia -She had a study at [facility] in February 2016, which showed iron 19, ferritin 8, TIBC 460, and saturation 4%. This is consistent with iron deficient anemia. -Her anemia has resolved, hemoglobin improved significantly. -I encouraged her to continue oral iron, she is not very compliant. -She had EGD and colonoscopy at [facility]: [facility] on 07/06/2015, I'll try to cut  a copy of the report  4. Pulmonary nodules -She was found to have a 3 mm noncalcified nodule in the right lung base. Repeat CT scan on 06/08/2015 showed no change, and additional smaller calcified and noncalcified nodules are all stable. -She has long-standing smoking history, but quit in May 2015.  5. HTN, CAD, asthma, GERD -She'll continue follow-up with her primary care physician  6. Bone health -We discussed that exemestane may weak her bone -I encouraged her to have a bone density scan, she will think about it. -I encouraged her to take calcium and vitamin D. -Her bone density scan was normal in November 2015, I'll repeat next month.  Plan -Continue exemestane -Bone DEXA scan within the next month -I'll see her back in 4 months with lab and follow-up, we'll order a screening mammogram on her next visit    Truitt Merle, MD 09/22/16 10:25 AM

## 2016-09-22 NOTE — Telephone Encounter (Signed)
Gave patient avs report and appointments for February. Patient has already had flu shot. No dexa scan order. [facility] can call patient when order entered.

## 2016-09-23 LAB — HEPATIC FUNCTION PANEL
ALT: 22 IU/L (ref 0–32)
AST (SGOT): 23 IU/L (ref 0–40)
Albumin, Serum: 4.1 g/dL (ref 3.5–4.8)
Alkaline Phosphatase, S: 79 IU/L (ref 39–117)
BILIRUBIN TOTAL: 0.4 mg/dL (ref 0.0–1.2)
BILIRUBIN, DIRECT: 0.12 mg/dL (ref 0.00–0.40)
TOTAL PROTEIN: 6.4 g/dL (ref 6.0–8.5)

## 2017-01-18 ENCOUNTER — Telehealth: Payer: Self-pay | Admitting: Hematology

## 2017-01-18 NOTE — Telephone Encounter (Signed)
t called to r/s 2/9 appts due to death in her family. gave pt new appt date/time per request

## 2017-01-19 ENCOUNTER — Other Ambulatory Visit: Payer: Medicare Other

## 2017-01-19 ENCOUNTER — Other Ambulatory Visit: Payer: Self-pay | Admitting: Hematology

## 2017-01-19 ENCOUNTER — Ambulatory Visit: Payer: Medicare Other | Admitting: Hematology

## 2017-01-19 DIAGNOSIS — C50411 Malignant neoplasm of upper-outer quadrant of right female breast: Secondary | ICD-10-CM

## 2017-01-19 DIAGNOSIS — Z170 Estrogen receptor positive status [ER+]: Principal | ICD-10-CM

## 2017-02-06 ENCOUNTER — Telehealth: Payer: Self-pay | Admitting: Hematology

## 2017-02-06 ENCOUNTER — Ambulatory Visit: Payer: Medicare Other | Admitting: Hematology

## 2017-02-06 ENCOUNTER — Other Ambulatory Visit: Payer: Medicare Other

## 2017-02-06 NOTE — Telephone Encounter (Signed)
Patient called to reschedule appt for 10/13 los. Patient aware of new date and time.

## 2017-02-20 ENCOUNTER — Telehealth: Payer: Self-pay | Admitting: Hematology

## 2017-02-20 NOTE — Telephone Encounter (Signed)
Patient needs to cancel and reschedule appointment 3/15

## 2017-02-21 ENCOUNTER — Telehealth: Payer: Self-pay | Admitting: Hematology

## 2017-02-21 NOTE — Telephone Encounter (Signed)
Rescheduled appt with patient. Patient is aware of new date and time.

## 2017-02-22 ENCOUNTER — Other Ambulatory Visit: Payer: Medicare Other

## 2017-02-22 ENCOUNTER — Ambulatory Visit: Payer: Medicare Other | Admitting: Hematology

## 2017-02-27 NOTE — Progress Notes (Signed)
[facility] NOTE  Patient Care Team: Jearld Fenton, NP as PCP - General (Internal Medicine) Charolette Forward, MD as Consulting Physician (Cardiology) Danice Goltz Sorscher, MD (Hematology and Oncology)  CHIEF COMPLAINTS:  Follow up right breast cancer  Oncology History   Breast cancer of upper-outer quadrant of right female breast   Staging form: Breast, AJCC 7th Edition     Pathologic stage from 09/01/2014: Stage IA (T1a(m), N0, cM0) - Unsigned       Breast cancer of upper-outer quadrant of right female breast ([place])   05/21/2014 Initial Biopsy    Right breast biopsy showed grade 2 DCIS with papillary features, ER/PR strongly positive.      05/21/2014 Receptors her2    ER 93%+, PR 100%      05/21/2014 Initial Diagnosis    Breast cancer of upper-outer quadrant of right female breast      09/01/2014 Surgery    Right mastectomy with sentinel lymph node biopsy, surgical margins were negative. Left lumpectomy showed fibrocystic changes.       10/11/2014 -  Anti-estrogen oral therapy    Exemestane 25 mg once daily      02/09/2016 Imaging    MM Screening breast to[facility] IMPRESSION: No mammographic evidence of malignancy. A result letter of this screening mammogram will be mailed directly to the patient.       HISTORY OF PRESENTING ILLNESS:  [name] 71 y.o. female with past medical history of stage I right breast cancer, presents to our clinic to transfer her oncology care to our cancer center.   The cancer was detected by screening mammogram in March 2015. The cancer was not palpable prior to diagnosis. She underwent initial core needle biopsy of the right breast mass, which showed grade 2 DCIS, ER PR positive. She initially was seen by surgeon Dr. Lucia Gaskins in [place], then she decided to transfer her care to [facility]. She underwent right breast mastectomy and sentinel lymph node, which showed 2 small focus of stage IA  invasive ductal carcinoma, left breast lumpectomy showed benign fibrocystic changes. She did not have reconstruction. She subsequently started antiestrogen oral therapy with exemestane in November 2015. She has been under Dr. Johnny Bridge care at [facility] in [place]. She lives in [place], and I would like to transfer her care to our cancer center which is close to home.  She is doing well overall. She has had hot flush at night for many years, which got slightly worse after she started exemestane, it wakes her up at night, but overall still manageable and tolerable. No joint or other complains. She has mild fatigue, appetite is moderate. Wight stable.   I reviewed her outside records extensively and collaborated the history with the patient.  CURRENT THERAPY: Exemestane 25 mg once daily  INTERIM HISTORY [name] returns for follow-up. She missed her February appointment because her friend passed away on 2017/02/10. She has no trouble or hot flashes with Exemestane. She will try to eat healthier and exercise now that weather has improved.   MEDICAL HISTORY:  Past Medical History:  Diagnosis Date  . Allergic rhinitis   . Anginal pain ([place])   . Arthritis   . Asthma   . Coronary artery disease   . Emphysema of lung ([place])   . GERD (gastroesophageal reflux disease)   . Hypertension   . Lupus (systemic lupus erythematosus) ([place])   . Myocardial infarction     SURGICAL HISTORY: Past Surgical  History:  Procedure Laterality Date  . CORONARY STENT PLACEMENT  1995    SOCIAL HISTORY: Social History   Social History  . Marital status: Divorced    Spouse name: N/A  . Number of children: N/A  . Years of education: 33   Occupational History  . 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 Smoking status: Former Smoker    Packs/day: 0.50    Years: 50.00    Types: Cigarettes    Quit date: 04/10/2014  . Smokeless tobacco: Never Used  .  Alcohol use Yes     Comment: occassional  . Drug use: No  . Sexual activity: Yes    Birth control/ protection: Post-menopausal   Other Topics Concern  . Not on file   Social History Narrative   Pt is divorced and lives alone.    Regular exercise-no   Caffeine Use-yes    FAMILY HISTORY: Family History  Problem Relation Age of Onset  . Allergies Mother   . Heart disease Mother   . Diabetes Mother   . Hypertension Mother   . Cancer Father     Lung Cancer  . Cancer Sister 75    lung cancer   . Cancer Maternal Aunt 78    colon cancer   . Cancer Maternal Aunt 90    uterine cancer     ALLERGIES:  is allergic to lipitor [atorvastatin].  MEDICATIONS:  Current Outpatient Prescriptions  Medication Sig Dispense Refill  . ALPRAZolam (XANAX) 0.25 MG tablet Take 0.25 mg by mouth 2 (two) times daily as needed.     Marland Kitchen amLODipine (NORVASC) 5 MG tablet Take 5 mg by mouth daily.    Marland Kitchen aspirin EC 81 MG EC tablet Take 1 tablet (81 mg total) by mouth daily. 30 tablet 3  . exemestane (AROMASIN) 25 MG tablet Take 1 tablet (25 mg total) by mouth daily. 90 tablet 1  . isosorbide mononitrate (IMDUR) 30 MG 24 hr tablet Take 30 mg by mouth daily.      . metoprolol succinate (TOPROL-XL) 50 MG 24 hr tablet Take 50 mg by mouth daily. Take with or immediately following a meal.    . ramipril (ALTACE) 10 MG capsule Take 10 mg by mouth 2 (two) times daily.     . nitroGLYCERIN (NITROSTAT) 0.4 MG SL tablet Place 0.4 mg under the tongue every 5 (five) minutes as needed. Reported on 01/11/2016    . pantoprazole (PROTONIX) 40 MG tablet Take 40 mg by mouth daily.  3  . pravastatin (PRAVACHOL) 40 MG tablet Take 40 mg by mouth at bedtime.    . ranitidine (ZANTAC) 300 MG tablet Take 1 tablet by mouth at bedtime.  5  . venlafaxine XR (EFFEXOR XR) 37.5 MG 24 hr capsule Take 1 capsule (37.5 mg total) by mouth daily with breakfast. (Patient not taking: Reported on 03/01/2017) 60 capsule 1   No current  facility-administered medications for this visit.     REVIEW OF SYSTEMS:   Constitutional: Denies fevers, chills or abnormal night sweats Eyes: Denies blurriness of vision, double vision or watery eyes Ears, nose, mouth, throat, and face: Denies mucositis or sore throat Respiratory: Denies cough, dyspnea or wheezes Cardiovascular: Denies palpitation, chest discomfort or lower extremity swelling Gastrointestinal:  Denies nausea, heartburn or change in bowel habits Skin: Denies abnormal skin rashes Lymphatics: Denies new lymphadenopathy or easy bruising Neurological:Denies numbness, tingling or new weaknesses Behavioral/Psych: Mood is stable,  no new changes  All other systems were reviewed with the patient and are negative.  PHYSICAL EXAMINATION:  ECOG PERFORMANCE STATUS: 0 - Asymptomatic  Vitals:   03/01/17 1528  BP: 140/82  Pulse: 72  Resp: 18  Temp: 98.4 F (36.9 C)   Filed Weights   03/01/17 1528  Weight: 182 lb (82.6 kg)    GENERAL:alert, no distress and comfortable SKIN: skin color, texture, turgor are normal, no rashes or significant lesions EYES: normal, conjunctiva are pink and non-injected, sclera clear OROPHARYNX:no exudate, no erythema and lips, buccal mucosa, and tongue normal  NECK: supple, thyroid normal size, non-tender, without nodularity LYMPH:  no palpable lymphadenopathy in the cervical, axillary or inguinal LUNGS: clear to auscultation and percussion with normal breathing effort HEART: regular rate & rhythm and no murmurs and no lower extremity edema ABDOMEN:abdomen soft, non-tender and normal bowel sounds Musculoskeletal:no cyanosis of digits and no clubbing  PSYCH: alert & oriented x 3 with fluent speech NEURO: no focal motor/sensory deficits. Breasts: s/p right mastectomy, surgical scar well healed. No palpable mass in the right chest wall or axilla. Left is normal  no obvious mass in breast, the surgical scar in left breast is  well-healed   LABORATORY DATA:  I have reviewed the data as listed CBC Latest Ref Rng & Units 03/01/2017 09/18/2016 06/08/2016  WBC 3.9 - 10.3 10e3/uL 8.4 6.3 7.0  Hemoglobin 11.6 - 15.9 g/dL 14.1 13.9 13.8  Hematocrit 34.8 - 46.6 % 42.6 42.4 41.0  Platelets 145 - 400 10e3/uL 313 247 272    CMP Latest Ref Rng & Units 03/01/2017 09/22/2016 09/18/2016  Glucose 70 - 140 mg/dl 95 - 106(H)  BUN 7.0 - 26.0 mg/dL 12.6 - <5(L)  Creatinine 0.6 - 1.1 mg/dL 0.8 - 0.75  Sodium 136 - 145 mEq/L 140 - 139  Potassium 3.5 - 5.1 mEq/L 3.8 - 3.4(L)  Chloride 101 - 111 mmol/L - - 107  CO2 22 - 29 mEq/L 21(L) - 22  Calcium 8.4 - 10.4 mg/dL 9.4 - 9.4  Total Protein 6.4 - 8.3 g/dL 7.2 6.4 -  Total Bilirubin 0.20 - 1.20 mg/dL 0.41 0.4 -  Alkaline Phos 40 - 150 U/L 86 79 -  AST 5 - 34 U/L 33 23 -  ALT 0 - 55 U/L 43 22 -    PATHOLOGY REPORT Pathology: ACCESSION NUMBER: [id number] RECEIVED: 09/01/2014 ORDERING PHYSICIAN: Angelina Ok , MD PATIENT NAME: Jaquith, Liesel SURGICAL PATHOLOGY REPORT  FINAL PATHOLOGIC DIAGNOSIS MICROSCOPIC EXAMINATION AND DIAGNOSIS  A. SENTINEL LYMPH NODE, RIGHT AXILLA, #1, EXCISION: One benign lymph node (0/1).  B. SENTINEL LYMPH NODE, RIGHT AXILLA, #2, EXCISION:  One benign lymph node (0/1).  C. BREAST, RIGHT, MASTECTOMY: Invasive ductal carcinoma, two foci, 0.3 and 0.2 cm, arising in background of ductal carcinoma in situ, intermediate-high nuclear grade, papillary growth pattern with necrosis and calcifications. Previous biopsy site changes. Margins negative for malignancy. Unremarkable skin. See comment and cancer protocol.  D. BREAST, LEFT, NEEDLE LOCALIZED LUMPECTOMY: Fibrocystic changes including stromal fibrosis, cysts formation, apocrine metaplasia, columnar cell alteration and usual ductal hyperplasia. Negative for atypia and carcinoma. Previous biopsy site changes. Microcalcifications  present.   RADIOGRAPHIC STUDIES: I have personally reviewed the radiological images as listed and agreed with the findings in the report.  MG MAMMOGRAM SCREENING LEFT 02/11/2016 IMPRESSION: No mammographic evidence of malignancy. A result letter of this screening mammogram will be mailed directly to the patient.  RECOMMENDATION: Screening mammogram in one year. (Code:SM-B-01Y)   ASSESSMENT:  71  y.o.  postmenopausal African-American female  1. Right breast multifocal invasive ductal carcinoma, pT1aN0M0, stage IA, ER+/PR+, and background DCIS  -She is status post mastectomy, on adjuvant exemestane now. -I reviewed the natural history of early stage breast cancer. Given her small size of tumor, strong ER/PR positivity, her risk of cancer recurrence is low. -I strongly encouraged her to continue exemastine, for total 5 years. Although we do have clinical data showing that 10 years adjuvant AI is better than 5 years, however giving her early stage of cancer, I think that 5 years of adjuvant therapy would be adequate. -We again reviewed the side effects of exemestane, she is tolerating well -She is clinically doing well overall, exam was previously unremarkable, last mammogram was negative in March 2017 -Lab results reviewed with her, unremarkable. -We'll continue surveillance. I previously encouraged her to continue calcium and vitamin D, and remains to be physically active - She is due for bone DEXA scan and mammogram. She will have both done at the same time. I will follow up if she does not hear back from them in 1 week - She has been tolerating exemestane well lately - follow up every 6 months. After 5 years, once a year.  2. Hot flush -Secondary to exemestane -Improved, manageable. -She didn't try Effexor, but did not like it and stopped.  3. Iron deficient anemia -She had a study at [facility] in February 2016, which showed iron 19, ferritin 8, TIBC 460, and saturation 4%. This is  consistent with iron deficient anemia. -Her anemia has resolved, hemoglobin improved significantly. -I encouraged her to continue oral iron, she is not very compliant. -She had EGD and colonoscopy at [facility] on 07/06/2015, I'll try to cut a copy of the report - Anemia has resolved now  4. Pulmonary nodules -She was found to have a 3 mm noncalcified nodule in the right lung base. Repeat CT scan on 06/08/2015 showed no change, and additional smaller calcified and noncalcified nodules are all stable. -She has long-standing smoking history, but quit in May 2015.  5. HTN, CAD, asthma, GERD -She'll continue follow-up with her primary care physician  6. Bone health -We previously discussed that exemestane may weak her bone -I again encouraged her to have a bone density scan, she agreed this time  -I previously encouraged her to take calcium and vitamin D. -Her bone density scan was normal in November 2015  Plan  -Continue exemestane. I will refill today with 6 months supply -Bone DEXA scan and mammogram within the next month, I will order today -I'll see her back in 6 months with lab and follow-up   This document serves as a record of services personally performed by Truitt Merle, MD. It was created on her behalf by Brandt Loosen, a trained medical scribe. The creation of this record is based on the scribe's personal observations and the provider's statements to them. This document has been checked and approved by the attending provider.   Truitt Merle, MD 03/01/2017

## 2017-02-28 ENCOUNTER — Telehealth: Payer: Self-pay | Admitting: *Deleted

## 2017-02-28 NOTE — Telephone Encounter (Signed)
Left message on pt's vm regarding appts for 03/01/17 @ 3 pm for lab & 3:30 pm for Dr Burr Medico.

## 2017-03-01 ENCOUNTER — Other Ambulatory Visit (HOSPITAL_BASED_OUTPATIENT_CLINIC_OR_DEPARTMENT_OTHER): Payer: Medicare Other

## 2017-03-01 ENCOUNTER — Ambulatory Visit (HOSPITAL_BASED_OUTPATIENT_CLINIC_OR_DEPARTMENT_OTHER): Payer: Medicare Other | Admitting: Hematology

## 2017-03-01 ENCOUNTER — Telehealth: Payer: Self-pay | Admitting: Hematology

## 2017-03-01 VITALS — BP 140/82 | HR 72 | Temp 98.4°F | Resp 18 | Ht 66.0 in | Wt 182.0 lb

## 2017-03-01 DIAGNOSIS — D509 Iron deficiency anemia, unspecified: Secondary | ICD-10-CM

## 2017-03-01 DIAGNOSIS — C50411 Malignant neoplasm of upper-outer quadrant of right female breast: Secondary | ICD-10-CM

## 2017-03-01 DIAGNOSIS — Z170 Estrogen receptor positive status [ER+]: Secondary | ICD-10-CM

## 2017-03-01 DIAGNOSIS — I10 Essential (primary) hypertension: Secondary | ICD-10-CM

## 2017-03-01 DIAGNOSIS — E2839 Other primary ovarian failure: Secondary | ICD-10-CM

## 2017-03-01 DIAGNOSIS — D500 Iron deficiency anemia secondary to blood loss (chronic): Secondary | ICD-10-CM | POA: Insufficient documentation

## 2017-03-01 LAB — COMPREHENSIVE METABOLIC PANEL
ALBUMIN: 4 g/dL (ref 3.5–5.0)
ALK PHOS: 86 U/L (ref 40–150)
ALT: 43 U/L (ref 0–55)
ANION GAP: 9 meq/L (ref 3–11)
AST: 33 U/L (ref 5–34)
BUN: 12.6 mg/dL (ref 7.0–26.0)
CALCIUM: 9.4 mg/dL (ref 8.4–10.4)
CO2: 21 meq/L — AB (ref 22–29)
Chloride: 110 mEq/L — ABNORMAL HIGH (ref 98–109)
Creatinine: 0.8 mg/dL (ref 0.6–1.1)
EGFR: 87 mL/min/{1.73_m2} — ABNORMAL LOW (ref >90)
GLUCOSE: 95 mg/dL (ref 70–140)
POTASSIUM: 3.8 meq/L (ref 3.5–5.1)
Sodium: 140 mEq/L (ref 136–145)
TOTAL PROTEIN: 7.2 g/dL (ref 6.4–8.3)
Total Bilirubin: 0.41 mg/dL (ref 0.20–1.20)

## 2017-03-01 LAB — CBC WITH DIFFERENTIAL/PLATELET
BASO%: 0.7 % (ref 0.0–2.0)
BASOS ABS: 0.1 10*3/uL (ref 0.0–0.1)
EOS ABS: 0 10*3/uL (ref 0.0–0.5)
EOS%: 0.6 % (ref 0.0–7.0)
HEMATOCRIT: 42.6 % (ref 34.8–46.6)
HEMOGLOBIN: 14.1 g/dL (ref 11.6–15.9)
LYMPH%: 20.6 % (ref 14.0–49.7)
MCH: 31.4 pg (ref 25.1–34.0)
MCHC: 33.2 g/dL (ref 31.5–36.0)
MCV: 94.6 fL (ref 79.5–101.0)
MONO#: 0.5 10*3/uL (ref 0.1–0.9)
MONO%: 5.9 % (ref 0.0–14.0)
NEUT%: 72.2 % (ref 38.4–76.8)
NEUTROS ABS: 6.1 10*3/uL (ref 1.5–6.5)
PLATELETS: 313 10*3/uL (ref 145–400)
RBC: 4.5 10*6/uL (ref 3.70–5.45)
RDW: 13.3 % (ref 11.2–14.5)
WBC: 8.4 10*3/uL (ref 3.9–10.3)
lymph#: 1.7 10*3/uL (ref 0.9–3.3)

## 2017-03-01 LAB — IRON AND TIBC
%SAT: 54 % (ref 21–57)
IRON: 188 ug/dL — AB (ref 41–142)
TIBC: 350 ug/dL (ref 236–444)
UIBC: 162 ug/dL (ref 120–384)

## 2017-03-01 LAB — FERRITIN: Ferritin: 35 ng/ml (ref 9–269)

## 2017-03-01 MED ORDER — EXEMESTANE 25 MG PO TABS: 25.0000 mg | ORAL_TABLET | Freq: Every day | ORAL | 1 refills | Status: DC

## 2017-03-01 NOTE — Telephone Encounter (Signed)
Gave patient avs report and appointments for September and appointments for mammo/bone density in April.

## 2017-03-04 ENCOUNTER — Encounter: Payer: Self-pay | Admitting: Hematology

## 2017-03-22 ENCOUNTER — Other Ambulatory Visit: Payer: Medicare Other

## 2017-03-22 ENCOUNTER — Other Ambulatory Visit: Payer: Self-pay | Admitting: Hematology

## 2017-03-22 DIAGNOSIS — Z1231 Encounter for screening mammogram for malignant neoplasm of breast: Secondary | ICD-10-CM

## 2017-03-26 ENCOUNTER — Inpatient Hospital Stay: Admission: RE | Admit: 2017-03-26 | Payer: Medicare Other | Source: Ambulatory Visit

## 2017-03-26 ENCOUNTER — Ambulatory Visit: Payer: Medicare Other

## 2017-03-26 ENCOUNTER — Telehealth: Payer: Self-pay | Admitting: *Deleted

## 2017-03-26 NOTE — Telephone Encounter (Signed)
"  I need to reschedule my imaging appointments for today.  I do not have the phone number."  Called patient who reports she has rescheduled to May 8th.

## 2017-04-17 ENCOUNTER — Ambulatory Visit
Admission: RE | Admit: 2017-04-17 | Discharge: 2017-04-17 | Disposition: A | Discharge status: Home / Self Care | Payer: Medicare Other | Source: Ambulatory Visit | Attending: Hematology | Admitting: Hematology

## 2017-04-17 DIAGNOSIS — Z1231 Encounter for screening mammogram for malignant neoplasm of breast: Secondary | ICD-10-CM

## 2017-04-17 DIAGNOSIS — E2839 Other primary ovarian failure: Secondary | ICD-10-CM

## 2017-06-15 ENCOUNTER — Telehealth: Payer: Self-pay | Admitting: *Deleted

## 2017-06-15 NOTE — Telephone Encounter (Signed)
TCT patient regarding refill request received from pharmacy for Effexor for hot flashes. Per Dr. Ernestina Penna  last note in March 2018, it states that pt is not taking Effexor.  Called to to confirm. No answer. LVM for pt to call back at her convenience.

## 2017-08-28 NOTE — Progress Notes (Signed)
[facility] FOLLOW UP NOTE  Patient Care Team: Jearld Fenton, NP as PCP - General (Internal Medicine) Charolette Forward, MD as Consulting Physician (Cardiology) Sorscher, Danice Goltz, MD (Hematology and Oncology)  CHIEF COMPLAINTS:  Follow up right breast cancer  Oncology History   Breast cancer of upper-outer quadrant of right female breast   Staging form: Breast, AJCC 7th Edition     Pathologic stage from 09/01/2014: Stage IA (T1a(m), N0, cM0) - Unsigned       Breast cancer of upper-outer quadrant of right female breast ([place])   05/21/2014 Initial Biopsy    Right breast biopsy showed grade 2 DCIS with papillary features, ER/PR strongly positive.      05/21/2014 Receptors her2    ER 93%+, PR 100%      05/21/2014 Initial Diagnosis    Breast cancer of upper-outer quadrant of right female breast      09/01/2014 Surgery    Right mastectomy with sentinel lymph node biopsy, surgical margins were negative. Left lumpectomy showed fibrocystic changes.       10/11/2014 -  Anti-estrogen oral therapy    Exemestane 25 mg once daily      02/09/2016 Imaging    MM Screening breast tomo Uni L IMPRESSION: No mammographic evidence of malignancy. A result letter of this screening mammogram will be mailed directly to the patient.      04/17/2017 Mammogram    negative      04/17/2017 Imaging    DEXA Scan measured at Femur Neck Right is 0.927 g/cm2 with a T-score of -0.8.       HISTORY OF PRESENTING ILLNESS:  [name] 71 y.o. female with past medical history of stage I right breast cancer, presents to our clinic to transfer her oncology care to our cancer center.   The cancer was detected by screening mammogram in March 2015. The cancer was not palpable prior to diagnosis. She underwent initial core needle biopsy of the right breast mass, which showed grade 2 DCIS, ER PR positive. She initially was seen by surgeon Dr. Lucia Gaskins in [place], then she decided to transfer  her care to [facility]. She underwent right breast mastectomy and sentinel lymph node, which showed 2 small focus of stage IA invasive ductal carcinoma, left breast lumpectomy showed benign fibrocystic changes. She did not have reconstruction. She subsequently started antiestrogen oral therapy with exemestane in November 2015. She has been under Dr. Johnny Bridge care at [facility] in [place]. She lives in [place], and I would like to transfer her care to our cancer center which is close to home.  She is doing well overall. She has had hot flush at night for many years, which got slightly worse after she started exemestane, it wakes her up at night, but overall still manageable and tolerable. No joint or other complains. She has mild fatigue, appetite is moderate. Wight stable.   I reviewed her outside records extensively and collaborated the history with the patient.  CURRENT THERAPY: Exemestane 25 mg once daily  INTERIM HISTORY [name] returns for follow-up.She has been doing well. Her mammogram and bone density scan in May were both normal. She reports a throbbing pain and occasional soreness in her incision site. She denies any trouble with exemestane.   MEDICAL HISTORY:  Past Medical History:  Diagnosis Date  . Allergic rhinitis   . Anginal pain ([place])   . Arthritis   . Asthma   . Coronary artery disease   .  Emphysema of lung ([place])   . GERD (gastroesophageal reflux disease)   . Hypertension   . Lupus (systemic lupus erythematosus) ([place])   . Myocardial infarction [facility])     SURGICAL HISTORY: Past Surgical History:  Procedure Laterality Date  . CORONARY STENT PLACEMENT  1995    SOCIAL HISTORY: Social History   Social History  . Marital status: Divorced    Spouse name: N/A  . Number of children: N/A  . Years of education: 72   Occupational History  . 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  Smoking status: Former Smoker    Packs/day: 0.50    Years: 50.00    Types: Cigarettes    Quit date: 04/10/2014  . Smokeless tobacco: Never Used  . Alcohol use Yes     Comment: occassional  . Drug use: No  . Sexual activity: Yes    Birth control/ protection: Post-menopausal   Other Topics Concern  . Not on file   Social History Narrative   Pt is divorced and lives alone.    Regular exercise-no   Caffeine Use-yes    FAMILY HISTORY: Family History  Problem Relation Age of Onset  . Allergies Mother   . Heart disease Mother   . Diabetes Mother   . Hypertension Mother   . Cancer Father        Lung Cancer  . Cancer Sister 68       lung cancer   . Cancer Maternal Aunt 78       colon cancer   . Cancer Maternal Aunt 90       uterine cancer     ALLERGIES:  is allergic to lipitor [atorvastatin].  MEDICATIONS:  Current Outpatient Prescriptions  Medication Sig Dispense Refill  . ALPRAZolam (XANAX) 0.25 MG tablet Take 0.25 mg by mouth 2 (two) times daily as needed.     Marland Kitchen amLODipine (NORVASC) 5 MG tablet Take 5 mg by mouth daily.    Marland Kitchen aspirin EC 81 MG EC tablet Take 1 tablet (81 mg total) by mouth daily. 30 tablet 3  . exemestane (AROMASIN) 25 MG tablet Take 1 tablet (25 mg total) by mouth daily. 90 tablet 1  . isosorbide mononitrate (IMDUR) 30 MG 24 hr tablet Take 30 mg by mouth daily.      . metoprolol succinate (TOPROL-XL) 50 MG 24 hr tablet Take 50 mg by mouth daily. Take with or immediately following a meal.    . nitroGLYCERIN (NITROSTAT) 0.4 MG SL tablet Place 0.4 mg under the tongue every 5 (five) minutes as needed. Reported on 01/11/2016    . pantoprazole (PROTONIX) 40 MG tablet Take 40 mg by mouth daily.  3  . pravastatin (PRAVACHOL) 40 MG tablet Take 40 mg by mouth at bedtime.    . ramipril (ALTACE) 10 MG capsule Take 10 mg by mouth 2 (two) times daily.     . ranitidine (ZANTAC) 300 MG tablet Take 1 tablet by mouth at bedtime.  5  . venlafaxine XR (EFFEXOR XR) 37.5 MG 24  hr capsule Take 1 capsule (37.5 mg total) by mouth daily with breakfast. 60 capsule 1  . potassium chloride SA (K-DUR,KLOR-CON) 20 MEQ tablet Take 1 tablet (20 mEq total) by mouth 2 (two) times daily. 20 tablet 0   No current facility-administered medications for this visit.     REVIEW OF SYSTEMS:   Constitutional: Denies fevers, chills or abnormal night sweats Eyes:  Denies blurriness of vision, double vision or watery eyes Ears, nose, mouth, throat, and face: Denies mucositis or sore throat Respiratory: Denies cough, dyspnea or wheezes Cardiovascular: Denies palpitation, chest discomfort or lower extremity swelling Gastrointestinal:  Denies nausea, heartburn or change in bowel habits Skin: Denies abnormal skin rashes Lymphatics: Denies new lymphadenopathy or easy bruising Neurological:Denies numbness, tingling or new weaknesses Behavioral/Psych: Mood is stable, no new changes  Breast: (+)throbbing feeling in surgical area All other systems were reviewed with the patient and are negative.  PHYSICAL EXAMINATION:  ECOG PERFORMANCE STATUS: 0 - Asymptomatic  Vitals:   08/30/17 1443  BP: (!) 142/82  Pulse: 76  Resp: 20  Temp: 98.4 F (36.9 C)  SpO2: 100%   Filed Weights   08/30/17 1443  Weight: 183 lb 3.2 oz (83.1 kg)    GENERAL:alert, no distress and comfortable SKIN: skin color, texture, turgor are normal, no rashes or significant lesions EYES: normal, conjunctiva are pink and non-injected, sclera clear OROPHARYNX:no exudate, no erythema and lips, buccal mucosa, and tongue normal  NECK: supple, thyroid normal size, non-tender, without nodularity LYMPH:  no palpable lymphadenopathy in the cervical, axillary or inguinal LUNGS: clear to auscultation and percussion with normal breathing effort HEART: regular rate & rhythm and no murmurs and no lower extremity edema ABDOMEN:abdomen soft, non-tender and normal bowel sounds Musculoskeletal:no cyanosis of digits and no clubbing   PSYCH: alert & oriented x 3 with fluent speech NEURO: no focal motor/sensory deficits. Breasts: s/p right mastectomy, surgical scar well healed. No palpable mass in the right chest wall or axilla. Left is normal  no obvious mass in breast, the surgical scar in left breast is well-healed,skin erythema under left breast   LABORATORY DATA:  I have reviewed the data as listed CBC Latest Ref Rng & Units 08/30/2017 03/01/2017 09/18/2016  WBC 3.9 - 10.3 10e3/uL 5.6 8.4 6.3  Hemoglobin 11.6 - 15.9 g/dL 14.1 14.1 13.9  Hematocrit 34.8 - 46.6 % 42.1 42.6 42.4  Platelets 145 - 400 10e3/uL 259 313 247    CMP Latest Ref Rng & Units 08/30/2017 03/01/2017 09/22/2016  Glucose 70 - 140 mg/dl 108 95 -  BUN 7.0 - 26.0 mg/dL 9.6 12.6 -  Creatinine 0.6 - 1.1 mg/dL 0.8 0.8 -  Sodium 136 - 145 mEq/L 142 140 -  Potassium 3.5 - 5.1 mEq/L 3.2(L) 3.8 -  Chloride 101 - 111 mmol/L - - -  CO2 22 - 29 mEq/L 20(L) 21(L) -  Calcium 8.4 - 10.4 mg/dL 9.4 9.4 -  Total Protein 6.4 - 8.3 g/dL 6.9 7.2 6.4  Total Bilirubin 0.20 - 1.20 mg/dL 0.32 0.41 0.4  Alkaline Phos 40 - 150 U/L 86 86 79  AST 5 - 34 U/L 27 33 23  ALT 0 - 55 U/L 22 43 22    PATHOLOGY REPORT Pathology: ACCESSION NUMBER: [id number] RECEIVED: 09/01/2014 ORDERING PHYSICIAN: MARISSA HOWARD-MCNATT , MD PATIENT NAME: Sternberg, Terressa SURGICAL PATHOLOGY REPORT  FINAL PATHOLOGIC DIAGNOSIS MICROSCOPIC EXAMINATION AND DIAGNOSIS  A. SENTINEL LYMPH NODE, RIGHT AXILLA, #1, EXCISION: One benign lymph node (0/1).  B. SENTINEL LYMPH NODE, RIGHT AXILLA, #2, EXCISION:  One benign lymph node (0/1).  C. BREAST, RIGHT, MASTECTOMY: Invasive ductal carcinoma, two foci, 0.3 and 0.2 cm, arising in background of ductal carcinoma in situ, intermediate-high nuclear grade, papillary growth pattern with necrosis and calcifications. Previous biopsy site changes. Margins negative for malignancy. Unremarkable skin. See comment and  cancer protocol.  D. BREAST, LEFT, NEEDLE LOCALIZED LUMPECTOMY: Fibrocystic changes including stromal  fibrosis, cysts formation, apocrine metaplasia, columnar cell alteration and usual ductal hyperplasia. Negative for atypia and carcinoma. Previous biopsy site changes. Microcalcifications present.   RADIOGRAPHIC STUDIES: I have personally reviewed the radiological images as listed and agreed with the findings in the report.   Bone Density 04/17/2017 measured at Femur Neck Right is 0.927 g/cm2 with a T-score of -0.8. Normal.  Mammogram 04/17/2017 Negative  MG MAMMOGRAM SCREENING LEFT 02/11/2016 IMPRESSION: No mammographic evidence of malignancy. A result letter of this screening mammogram will be mailed directly to the patient.  RECOMMENDATION: Screening mammogram in one year. (Code:SM-B-01Y)   ASSESSMENT:  71 y.o.  postmenopausal African-American female  1. Right breast multifocal invasive ductal carcinoma, pT1aN0M0, stage IA, ER+/PR+, and background DCIS  -She is status post mastectomy, on adjuvant exemestane now. -I reviewed the natural history of early stage breast cancer. Given her small size of tumor, strong ER/PR positivity, her risk of cancer recurrence is low. -I strongly encouraged her to continue exemastine, for total 5 years. Although we do have clinical data showing that 10 years adjuvant AI is better than 5 years, however giving her early stage of cancer, I think that 5 years of adjuvant therapy would be adequate. -We again reviewed the side effects of exemestane, she is tolerating well -  last mammogram was negative in May 2018 -Lab results reviewed with her, her potassium is low at 3.2. I prescribed potassium tablets for her to take once daily for 1 week. -We'll continue surveillance. I previously encouraged her to continue calcium and vitamin D, and remains to be physically active - I strongly encouraged her to keep area under breast dry due to  skin erythema  -She is clinically doing well overall, exam was previously unremarkable, - She has been tolerating exemestane well lately - follow up every 6 months. After 5 years, once a year.   2. Hot flush -Secondary to exemestane -much improved, manageable. -She did try Effexor, but did not like it and stopped.  3. Iron deficient anemia -She had a study at [facility] in February 2016, which showed iron 19, ferritin 8, TIBC 460, and saturation 4%. This is consistent with iron deficient anemia. -Her anemia has resolved, hemoglobin improved significantly. -I encouraged her to continue oral iron, she is not very compliant. -she recently had EGD and COLONOSCOPY at [facility], which showed mild gastritis and benign polyps  - Anemia has resolved now  4. Pulmonary nodules -She was found to have a 3 mm noncalcified nodule in the right lung base. Repeat CT scan on 06/08/2015 showed no change, and additional smaller calcified and noncalcified nodules are all stable. -She has long-standing smoking history, but quit in May 2015.  5. HTN, CAD, asthma, GERD -She'll continue follow-up with her primary care physician  6. Bone health -We previously discussed that exemestane may weak her bone -I again encouraged her to have a bone density scan, she agreed this time  -I previously encouraged her to take calcium and vitamin D. -Her bone density scan was normal in May 2018  Plan  - flu shot in clinic today  - prescribe potassium 80mq once daily for  20 tabs, no refills  -Continue exemestane. I will refill today with 6 months supply -I'll see her back in 6 months with lab and follow-up   This document serves as a record of services personally performed by YTruitt Merle MD. It was created on her behalf by TBrandt Loosen a trained medical scribe. The creation of this record is  based on the scribe's personal observations and the provider's statements to them. This document has been checked and approved by the  attending provider.   Truitt Merle, MD 08/30/2017

## 2017-08-30 ENCOUNTER — Other Ambulatory Visit (HOSPITAL_BASED_OUTPATIENT_CLINIC_OR_DEPARTMENT_OTHER): Payer: Medicare Other

## 2017-08-30 ENCOUNTER — Encounter: Payer: Self-pay | Admitting: Hematology

## 2017-08-30 ENCOUNTER — Ambulatory Visit (HOSPITAL_BASED_OUTPATIENT_CLINIC_OR_DEPARTMENT_OTHER): Payer: Medicare Other | Admitting: Hematology

## 2017-08-30 ENCOUNTER — Telehealth: Payer: Self-pay | Admitting: Hematology

## 2017-08-30 VITALS — BP 142/82 | HR 76 | Temp 98.4°F | Resp 20 | Ht 66.0 in | Wt 183.2 lb

## 2017-08-30 DIAGNOSIS — Z170 Estrogen receptor positive status [ER+]: Principal | ICD-10-CM

## 2017-08-30 DIAGNOSIS — I10 Essential (primary) hypertension: Secondary | ICD-10-CM

## 2017-08-30 DIAGNOSIS — Z79811 Long term (current) use of aromatase inhibitors: Secondary | ICD-10-CM | POA: Diagnosis not present

## 2017-08-30 DIAGNOSIS — D509 Iron deficiency anemia, unspecified: Secondary | ICD-10-CM

## 2017-08-30 DIAGNOSIS — C50411 Malignant neoplasm of upper-outer quadrant of right female breast: Secondary | ICD-10-CM | POA: Diagnosis not present

## 2017-08-30 DIAGNOSIS — R918 Other nonspecific abnormal finding of lung field: Secondary | ICD-10-CM | POA: Diagnosis not present

## 2017-08-30 DIAGNOSIS — Z23 Encounter for immunization: Secondary | ICD-10-CM

## 2017-08-30 LAB — CBC WITH DIFFERENTIAL/PLATELET
BASO%: 1 % (ref 0.0–2.0)
Basophils Absolute: 0.1 10*3/uL (ref 0.0–0.1)
EOS ABS: 0.1 10*3/uL (ref 0.0–0.5)
EOS%: 1.3 % (ref 0.0–7.0)
HEMATOCRIT: 42.1 % (ref 34.8–46.6)
HGB: 14.1 g/dL (ref 11.6–15.9)
LYMPH#: 1.5 10*3/uL (ref 0.9–3.3)
LYMPH%: 26.2 % (ref 14.0–49.7)
MCH: 31.4 pg (ref 25.1–34.0)
MCHC: 33.4 g/dL (ref 31.5–36.0)
MCV: 93.8 fL (ref 79.5–101.0)
MONO#: 0.4 10*3/uL (ref 0.1–0.9)
MONO%: 7.5 % (ref 0.0–14.0)
NEUT%: 64 % (ref 38.4–76.8)
NEUTROS ABS: 3.6 10*3/uL (ref 1.5–6.5)
PLATELETS: 259 10*3/uL (ref 145–400)
RBC: 4.48 10*6/uL (ref 3.70–5.45)
RDW: 13.6 % (ref 11.2–14.5)
WBC: 5.6 10*3/uL (ref 3.9–10.3)

## 2017-08-30 LAB — COMPREHENSIVE METABOLIC PANEL
ALK PHOS: 86 U/L (ref 40–150)
ALT: 22 U/L (ref 0–55)
ANION GAP: 10 meq/L (ref 3–11)
AST: 27 U/L (ref 5–34)
Albumin: 3.6 g/dL (ref 3.5–5.0)
BILIRUBIN TOTAL: 0.32 mg/dL (ref 0.20–1.20)
BUN: 9.6 mg/dL (ref 7.0–26.0)
CALCIUM: 9.4 mg/dL (ref 8.4–10.4)
CO2: 20 mEq/L — ABNORMAL LOW (ref 22–29)
CREATININE: 0.8 mg/dL (ref 0.6–1.1)
Chloride: 113 mEq/L — ABNORMAL HIGH (ref 98–109)
EGFR: 88 mL/min/{1.73_m2} — AB (ref >90)
Glucose: 108 mg/dl (ref 70–140)
Potassium: 3.2 mEq/L — ABNORMAL LOW (ref 3.5–5.1)
Sodium: 142 mEq/L (ref 136–145)
TOTAL PROTEIN: 6.9 g/dL (ref 6.4–8.3)

## 2017-08-30 LAB — IRON AND TIBC
%SAT: 16 % — ABNORMAL LOW (ref 21–57)
IRON: 57 ug/dL (ref 41–142)
TIBC: 350 ug/dL (ref 236–444)
UIBC: 293 ug/dL (ref 120–384)

## 2017-08-30 LAB — FERRITIN: FERRITIN: 30 ng/mL (ref 9–269)

## 2017-08-30 MED ORDER — EXEMESTANE 25 MG PO TABS: 25.0000 mg | ORAL_TABLET | Freq: Every day | ORAL | 1 refills | Status: DC

## 2017-08-30 MED ORDER — INFLUENZA VAC SPLIT QUAD 0.5 ML IM SUSY
0.5000 mL | PREFILLED_SYRINGE | Freq: Once | INTRAMUSCULAR | Status: AC
  Administered 2017-08-30: 0.5 mL via INTRAMUSCULAR
  Filled 2017-08-30: qty 0.5

## 2017-08-30 MED ORDER — POTASSIUM CHLORIDE CRYS ER 20 MEQ PO TBCR: 20.0000 meq | EXTENDED_RELEASE_TABLET | Freq: Two times a day (BID) | ORAL | 0 refills | Status: DC

## 2017-08-30 NOTE — Telephone Encounter (Signed)
Gave avs and calendar for march 2019 

## 2018-02-08 ENCOUNTER — Other Ambulatory Visit: Payer: Self-pay | Admitting: Cardiology

## 2018-02-08 DIAGNOSIS — R079 Chest pain, unspecified: Secondary | ICD-10-CM

## 2018-02-15 ENCOUNTER — Ambulatory Visit (HOSPITAL_COMMUNITY): Payer: Medicare Other

## 2018-02-15 ENCOUNTER — Encounter (HOSPITAL_COMMUNITY): Payer: Self-pay

## 2018-02-26 NOTE — Progress Notes (Signed)
[facility] FOLLOW UP NOTE  Patient Care Team: Jearld Fenton, NP as PCP - General (Internal Medicine) Charolette Forward, MD as Consulting Physician (Cardiology) Sorscher, Danice Goltz, MD (Hematology and Oncology)  CHIEF COMPLAINTS:  Follow up right breast cancer  Oncology History   Breast cancer of upper-outer quadrant of right female breast   Staging form: Breast, AJCC 7th Edition     Pathologic stage from 09/01/2014: Stage IA (T1a(m), N0, cM0) - Unsigned       Breast cancer of upper-outer quadrant of right female breast ([place])   05/21/2014 Initial Biopsy    Right breast biopsy showed grade 2 DCIS with papillary features, ER/PR strongly positive.      05/21/2014 Receptors her2    ER 93%+, PR 100%      05/21/2014 Initial Diagnosis    Breast cancer of upper-outer quadrant of right female breast      09/01/2014 Surgery    Right mastectomy with sentinel lymph node biopsy, surgical margins were negative. Left lumpectomy showed fibrocystic changes.       10/11/2014 -  Anti-estrogen oral therapy    Exemestane 25 mg once daily      02/09/2016 Imaging    MM Screening breast tomo Uni L IMPRESSION: No mammographic evidence of malignancy. A result letter of this screening mammogram will be mailed directly to the patient.      04/17/2017 Mammogram    negative      04/17/2017 Imaging    DEXA Scan measured at Femur Neck Right is 0.927 g/cm2 with a T-score of -0.8.       HISTORY OF PRESENTING ILLNESS:  [name] 72 y.o. female with past medical history of stage I right breast cancer, presents to our clinic to transfer her oncology care to our cancer center.   The cancer was detected by screening mammogram in March 2015. The cancer was not palpable prior to diagnosis. She underwent initial core needle biopsy of the right breast mass, which showed grade 2 DCIS, ER PR positive. She initially was seen by surgeon Dr. Lucia Gaskins in [place], then she decided to transfer  her care to [facility]. She underwent right breast mastectomy and sentinel lymph node, which showed 2 small focus of stage IA invasive ductal carcinoma, left breast lumpectomy showed benign fibrocystic changes. She did not have reconstruction. She subsequently started antiestrogen oral therapy with exemestane in November 2015. She has been under Dr. Johnny Bridge care at [facility] in [place]. She lives in [place], and I would like to transfer her care to our cancer center which is close to home.  She is doing well overall. She has had hot flush at night for many years, which got slightly worse after she started exemestane, it wakes her up at night, but overall still manageable and tolerable. No joint or other complains. She has mild fatigue, appetite is moderate. Wight stable.   I reviewed her outside records extensively and collaborated the history with the patient.  CURRENT THERAPY: Exemestane 25 mg once daily  INTERIM HISTORY [name] returns for follow-up. She presents to the clinic today by herself. She reports she is doing well overall. She states she is having some intermittent  left shoulder pain that is similar to pain she had in her right shoulder when she was diagnosed with breast cancer in the past. She is compliant with Exemestane and reports no complaints.   On review of systems, pt denies swelling, skin redness, cough, SOB  or any other complaints at this time. Pertinent positives are listed and detailed within the above HPI.   MEDICAL HISTORY:  Past Medical History:  Diagnosis Date  . Allergic rhinitis   . Anginal pain ([place])   . Arthritis   . Asthma   . Coronary artery disease   . Emphysema of lung ([place])   . GERD (gastroesophageal reflux disease)   . Hypertension   . Lupus (systemic lupus erythematosus) ([place])   . Myocardial infarction [facility])     SURGICAL HISTORY: Past Surgical History:  Procedure Laterality Date  . CORONARY STENT  PLACEMENT  1995    SOCIAL HISTORY: Social History   Socioeconomic History  . Marital status: Divorced    Spouse name: Not on file  . Number of children: Not on file  . Years of education: 81  . Highest education level: Not on file  Social Needs  . Financial resource strain: Not on file  . Food insecurity - worry: Not on file  . Food insecurity - inability: Not on file  . Transportation needs - medical: Not on file  . Transportation needs - non-medical: Not on file  Occupational History  . Occupation: retired from the school systems    Employer: RETIRED    Comment: Control and instrumentation engineer  Tobacco Use  . Smoking status: Former Smoker    Packs/day: 0.50    Years: 50.00    Pack years: 25.00    Types: Cigarettes    Last attempt to quit: 04/10/2014    Years since quitting: 3.8  . Smokeless tobacco: Never Used  Substance and Sexual Activity  . Alcohol use: Yes    Comment: occassional  . Drug use: No  . Sexual activity: Yes    Birth control/protection: Post-menopausal  Other Topics Concern  . Not on file  Social History Narrative   Pt is divorced and lives alone.    Regular exercise-no   Caffeine Use-yes    FAMILY HISTORY: Family History  Problem Relation Age of Onset  . Allergies Mother   . Heart disease Mother   . Diabetes Mother   . Hypertension Mother   . Cancer Father        Lung Cancer  . Cancer Sister 70       lung cancer   . Cancer Maternal Aunt 78       colon cancer   . Cancer Maternal Aunt 90       uterine cancer     ALLERGIES:  is allergic to lipitor [atorvastatin].  MEDICATIONS:  Current Outpatient Medications  Medication Sig Dispense Refill  . ALPRAZolam (XANAX) 0.25 MG tablet Take 0.25 mg by mouth 2 (two) times daily as needed.     Marland Kitchen aspirin EC 81 MG EC tablet Take 1 tablet (81 mg total) by mouth daily. 30 tablet 3  . exemestane (AROMASIN) 25 MG tablet Take 1 tablet (25 mg total) by mouth daily. 90 tablet 1  . isosorbide mononitrate (IMDUR) 30 MG  24 hr tablet Take 30 mg by mouth daily.      . metoprolol succinate (TOPROL-XL) 50 MG 24 hr tablet Take 50 mg by mouth daily. Take with or immediately following a meal.    . pantoprazole (PROTONIX) 40 MG tablet Take 40 mg by mouth daily.  3  . pravastatin (PRAVACHOL) 40 MG tablet Take 40 mg by mouth at bedtime.    . ramipril (ALTACE) 10 MG capsule Take 10 mg by mouth 2 (two) times daily.     Marland Kitchen  venlafaxine XR (EFFEXOR XR) 37.5 MG 24 hr capsule Take 1 capsule (37.5 mg total) by mouth daily with breakfast. 60 capsule 1  . nitroGLYCERIN (NITROSTAT) 0.4 MG SL tablet Place 0.4 mg under the tongue every 5 (five) minutes as needed. Reported on 01/11/2016    . potassium chloride SA (K-DUR,KLOR-CON) 20 MEQ tablet Take 1 tablet (20 mEq total) by mouth 2 (two) times daily. (Patient not taking: Reported on 02/27/2018) 20 tablet 0   No current facility-administered medications for this visit.     REVIEW OF SYSTEMS:   Constitutional: Denies fevers, chills or abnormal night sweats Eyes: Denies blurriness of vision, double vision or watery eyes Ears, nose, mouth, throat, and face: Denies mucositis or sore throat Respiratory: Denies cough, dyspnea or wheezes Cardiovascular: Denies palpitation, chest discomfort or lower extremity swelling Gastrointestinal:  Denies nausea, heartburn or change in bowel habits Skin: Denies abnormal skin rashes Lymphatics: Denies new lymphadenopathy or easy bruising Neurological:Denies numbness, tingling or new weaknesses Behavioral/Psych: Mood is stable, no new changes  Breast: (+)throbbing feeling in surgical area MSK: (+) left shoulder pain  All other systems were reviewed with the patient and are negative.  PHYSICAL EXAMINATION:  ECOG PERFORMANCE STATUS: 0 - Asymptomatic  Vitals:   02/27/18 1128  BP: (!) 153/79  Pulse: 72  Resp: 17  Temp: 98.2 F (36.8 C)  SpO2: 99%   Filed Weights   02/27/18 1128  Weight: 181 lb 14.4 oz (82.5 kg)    GENERAL:alert, no distress  and comfortable SKIN: skin color, texture, turgor are normal, no rashes or significant lesions EYES: normal, conjunctiva are pink and non-injected, sclera clear OROPHARYNX:no exudate, no erythema and lips, buccal mucosa, and tongue normal  NECK: supple, thyroid normal size, non-tender, without nodularity LYMPH:  no palpable lymphadenopathy in the cervical, axillary or inguinal LUNGS: clear to auscultation and percussion with normal breathing effort HEART: regular rate & rhythm and no murmurs and no lower extremity edema ABDOMEN:abdomen soft, non-tender and normal bowel sounds Musculoskeletal:no cyanosis of digits and no clubbing, no left shoulder swollen, or skin change or tenderness.  The range of motion of left shoulder is normal. PSYCH: alert & oriented x 3 with fluent speech NEURO: no focal motor/sensory deficits. Breasts: s/p right mastectomy, surgical scar well healed. No palpable mass in the right chest wall or axilla. Left is normal  no obvious mass in breast, the surgical scar in left breast is well-healed   LABORATORY DATA:  I have reviewed the data as listed CBC Latest Ref Rng & Units 02/27/2018 08/30/2017 03/01/2017  WBC 3.9 - 10.3 K/uL 6.2 5.6 8.4  Hemoglobin 11.6 - 15.9 g/dL 14.7 14.1 14.1  Hematocrit 34.8 - 46.6 % 43.8 42.1 42.6  Platelets 145 - 400 K/uL 251 259 313    CMP Latest Ref Rng & Units 02/27/2018 08/30/2017 03/01/2017  Glucose 70 - 140 mg/dL 101 108 95  BUN 7 - 26 mg/dL 7 9.6 12.6  Creatinine 0.60 - 1.10 mg/dL 0.89 0.8 0.8  Sodium 136 - 145 mmol/L 141 142 140  Potassium 3.5 - 5.1 mmol/L 3.2(L) 3.2(L) 3.8  Chloride 98 - 109 mmol/L 108 - -  CO2 22 - 29 mmol/L 25 20(L) 21(L)  Calcium 8.4 - 10.4 mg/dL 9.3 9.4 9.4  Total Protein 6.4 - 8.3 g/dL 6.9 6.9 7.2  Total Bilirubin 0.2 - 1.2 mg/dL 0.7 0.32 0.41  Alkaline Phos 40 - 150 U/L 79 86 86  AST 5 - 34 U/L 50(H) 27 33  ALT 0 -  55 U/L 49 22 43    PATHOLOGY REPORT Pathology: ACCESSION NUMBER: [id number] RECEIVED:  09/01/2014 ORDERING PHYSICIAN: Angelina Ok , MD PATIENT NAME: Hall, Ana SURGICAL PATHOLOGY REPORT  FINAL PATHOLOGIC DIAGNOSIS MICROSCOPIC EXAMINATION AND DIAGNOSIS  A. SENTINEL LYMPH NODE, RIGHT AXILLA, #1, EXCISION: One benign lymph node (0/1).  B. SENTINEL LYMPH NODE, RIGHT AXILLA, #2, EXCISION:  One benign lymph node (0/1).  C. BREAST, RIGHT, MASTECTOMY: Invasive ductal carcinoma, two foci, 0.3 and 0.2 cm, arising in background of ductal carcinoma in situ, intermediate-high nuclear grade, papillary growth pattern with necrosis and calcifications. Previous biopsy site changes. Margins negative for malignancy. Unremarkable skin. See comment and cancer protocol.  D. BREAST, LEFT, NEEDLE LOCALIZED LUMPECTOMY: Fibrocystic changes including stromal fibrosis, cysts formation, apocrine metaplasia, columnar cell alteration and usual ductal hyperplasia. Negative for atypia and carcinoma. Previous biopsy site changes. Microcalcifications present.   RADIOGRAPHIC STUDIES: I have personally reviewed the radiological images as listed and agreed with the findings in the report.   Bone Density 04/17/2017 measured at Femur Neck Right is 0.927 g/cm2 with a T-score of -0.8. Normal.  Mammogram 04/17/2017 Negative  MG MAMMOGRAM SCREENING LEFT 02/11/2016 IMPRESSION: No mammographic evidence of malignancy. A result letter of this screening mammogram will be mailed directly to the patient.   ASSESSMENT:  72 y.o.  postmenopausal African-American female  1. Right breast multifocal invasive ductal carcinoma, pT1aN0M0, stage IA, ER+/PR+, and background DCIS  -She is status post mastectomy, on adjuvant exemestane now. -I previously reviewed the natural history of early stage breast cancer. Given her small size of tumor, strong ER/PR positivity, her risk of cancer recurrence is low. -I previously strongly encouraged her to  continue exemestane, for total 5 years. Although we do have clinical data showing that 10 years adjuvant AI is better than 5 years, however giving her early stage of cancer, I think that 5 years of adjuvant therapy would be adequate. -We again reviewed the side effects of exemestane, she is tolerating well -We'll continue surveillance. I previously encouraged her to continue calcium and vitamin D, and remains to be physically active - I strongly encouraged her to keep area under breast dry due to skin erythema  -She is clinically doing well overall, labs reviewed, mostly in WNL except elevated AST at 50. Will monitor this. Her exam is unremarkable. Her last mammogram from May 2018 reveals no evidence of malignancy. There is no clinical concern for recurrence. -Next mammogram due in May 2019. -Continue Exemestane  - follow up every 6 months. After 5 years, once a year.  2. Hot flush -Secondary to exemestane -much improved, manageable. -She did try Effexor, but did not like it and stopped.  3. Iron deficient anemia -She had a study at [facility] in February 2016, which showed iron 19, ferritin 8, TIBC 460, and saturation 4%. This is consistent with iron deficient anemia. -Her anemia has resolved -I encouraged her to continue oral iron, she is not very compliant. -she recently had EGD and COLONOSCOPY at [facility], which showed mild gastritis and benign polyps  - Anemia has resolved now  4. Pulmonary nodules -She was found to have a 3 mm noncalcified nodule in the right lung base. Repeat CT scan on 06/08/2015 showed no change, and additional smaller calcified and noncalcified nodules are all stable. -She has long-standing smoking history, but quit in May 2015.  5. HTN, CAD, asthma, GERD -She'll continue follow-up with her primary care physician  6. Bone health -We previously discussed that exemestane may weak her  bone -I again encouraged her to have a bone density scan, she agreed this time  -I  previously encouraged her to take calcium and vitamin D. -Her bone density scan was normal in May 2018  7. Left Shoulder pain - I suspect this pain is caused by arthritis. I discussed that shoulders are uncommon places for bone metastasis. If her pain persists and she becomes more worrisome in the future I can order a bone scan.  -Continue aleve/ibuprofen/tylenol, I encourage her to f/u with her PCP   Plan   -Continue exemestane, refilled today  -Mammogram in May 2019, ordered today -I'll see her back in 6 months with lab and follow-up   This document serves as a record of services personally performed by Truitt Merle, MD. It was created on her behalf by Theresia Bough, a trained medical scribe. The creation of this record is based on the scribe's personal observations and the provider's statements to them.   I have reviewed the above documentation for accuracy and completeness, and I agree with the above.    Truitt Merle, MD 02/27/2018 5:29 PM

## 2018-02-27 ENCOUNTER — Inpatient Hospital Stay: Payer: Medicare Other

## 2018-02-27 ENCOUNTER — Encounter: Payer: Self-pay | Admitting: Hematology

## 2018-02-27 ENCOUNTER — Telehealth: Payer: Self-pay | Admitting: Hematology

## 2018-02-27 ENCOUNTER — Inpatient Hospital Stay: Payer: Medicare Other | Attending: Hematology | Admitting: Hematology

## 2018-02-27 VITALS — BP 153/79 | HR 72 | Temp 98.2°F | Resp 17 | Ht 66.0 in | Wt 181.9 lb

## 2018-02-27 DIAGNOSIS — Z79811 Long term (current) use of aromatase inhibitors: Secondary | ICD-10-CM | POA: Insufficient documentation

## 2018-02-27 DIAGNOSIS — C50411 Malignant neoplasm of upper-outer quadrant of right female breast: Secondary | ICD-10-CM

## 2018-02-27 DIAGNOSIS — I10 Essential (primary) hypertension: Secondary | ICD-10-CM | POA: Insufficient documentation

## 2018-02-27 DIAGNOSIS — D509 Iron deficiency anemia, unspecified: Secondary | ICD-10-CM | POA: Diagnosis not present

## 2018-02-27 DIAGNOSIS — Z170 Estrogen receptor positive status [ER+]: Secondary | ICD-10-CM

## 2018-02-27 DIAGNOSIS — K219 Gastro-esophageal reflux disease without esophagitis: Secondary | ICD-10-CM | POA: Diagnosis not present

## 2018-02-27 DIAGNOSIS — C50811 Malignant neoplasm of overlapping sites of right female breast: Secondary | ICD-10-CM | POA: Diagnosis not present

## 2018-02-27 LAB — COMPREHENSIVE METABOLIC PANEL
ALK PHOS: 79 U/L (ref 40–150)
ALT: 49 U/L (ref 0–55)
ANION GAP: 8 (ref 3–11)
AST: 50 U/L — ABNORMAL HIGH (ref 5–34)
Albumin: 3.8 g/dL (ref 3.5–5.0)
BUN: 7 mg/dL (ref 7–26)
CALCIUM: 9.3 mg/dL (ref 8.4–10.4)
CHLORIDE: 108 mmol/L (ref 98–109)
CO2: 25 mmol/L (ref 22–29)
Creatinine, Ser: 0.89 mg/dL (ref 0.60–1.10)
GFR calc non Af Amer: >60 mL/min (ref >60)
Glucose, Bld: 101 mg/dL (ref 70–140)
POTASSIUM: 3.2 mmol/L — AB (ref 3.5–5.1)
SODIUM: 141 mmol/L (ref 136–145)
Total Bilirubin: 0.7 mg/dL (ref 0.2–1.2)
Total Protein: 6.9 g/dL (ref 6.4–8.3)

## 2018-02-27 LAB — CBC WITH DIFFERENTIAL/PLATELET
BASOS ABS: 0.1 10*3/uL (ref 0.0–0.1)
BASOS PCT: 1 %
Eosinophils Absolute: 0.1 10*3/uL (ref 0.0–0.5)
Eosinophils Relative: 2 %
HEMATOCRIT: 43.8 % (ref 34.8–46.6)
HEMOGLOBIN: 14.7 g/dL (ref 11.6–15.9)
LYMPHS PCT: 37 %
Lymphs Abs: 2.3 10*3/uL (ref 0.9–3.3)
MCH: 31.9 pg (ref 25.1–34.0)
MCHC: 33.6 g/dL (ref 31.5–36.0)
MCV: 94.9 fL (ref 79.5–101.0)
MONOS PCT: 6 %
Monocytes Absolute: 0.4 10*3/uL (ref 0.1–0.9)
NEUTROS ABS: 3.3 10*3/uL (ref 1.5–6.5)
NEUTROS PCT: 54 %
Platelets: 251 10*3/uL (ref 145–400)
RBC: 4.62 MIL/uL (ref 3.70–5.45)
RDW: 13 % (ref 11.2–14.5)
WBC: 6.2 10*3/uL (ref 3.9–10.3)

## 2018-02-27 LAB — IRON AND TIBC
Iron: 311 ug/dL — ABNORMAL HIGH (ref 41–142)
SATURATION RATIOS: 94 % — AB (ref 21–57)
TIBC: 333 ug/dL (ref 236–444)
UIBC: 22 ug/dL

## 2018-02-27 LAB — FERRITIN: FERRITIN: 37 ng/mL (ref 9–269)

## 2018-02-27 MED ORDER — EXEMESTANE 25 MG PO TABS: 25.0000 mg | ORAL_TABLET | Freq: Every day | ORAL | 1 refills | Status: DC

## 2018-02-27 NOTE — Telephone Encounter (Signed)
Appt scheduled AVS printed per 3/20 los

## 2018-03-04 ENCOUNTER — Telehealth: Payer: Self-pay | Admitting: *Deleted

## 2018-03-04 MED ORDER — POTASSIUM CHLORIDE CRYS ER 20 MEQ PO TBCR: 20.0000 meq | EXTENDED_RELEASE_TABLET | Freq: Two times a day (BID) | ORAL | 0 refills | Status: DC

## 2018-03-04 NOTE — Telephone Encounter (Signed)
TCT patient regarding lab results from 02/27/18. Spoke with patient and informed her that her iron level is good but her K+ was slightly low. Reviewed K+ rich food and also to increase KCL tabs to BID. Pt states she will need a refill. This was refilled for her.  Pt states she is trying hard to eat green leafy vegetables and she will add K+ rich foods as well.  No other questions or concerns.

## 2018-04-04 ENCOUNTER — Other Ambulatory Visit: Payer: Self-pay | Admitting: Hematology

## 2018-04-05 ENCOUNTER — Other Ambulatory Visit: Payer: Self-pay

## 2018-04-18 ENCOUNTER — Ambulatory Visit
Admission: RE | Admit: 2018-04-18 | Discharge: 2018-04-18 | Disposition: A | Discharge status: Home / Self Care | Payer: Medicare Other | Source: Ambulatory Visit | Attending: Hematology | Admitting: Hematology

## 2018-04-18 DIAGNOSIS — C50411 Malignant neoplasm of upper-outer quadrant of right female breast: Secondary | ICD-10-CM

## 2018-04-18 DIAGNOSIS — Z170 Estrogen receptor positive status [ER+]: Principal | ICD-10-CM

## 2018-08-23 ENCOUNTER — Telehealth: Payer: Self-pay | Admitting: Hematology

## 2018-08-23 NOTE — Telephone Encounter (Signed)
YF PAL 9/20 - moved appointments to 10/14. Marland Kitchen Left message. Schedule mailed.

## 2018-08-30 ENCOUNTER — Ambulatory Visit: Payer: Medicare Other | Admitting: Hematology

## 2018-08-30 ENCOUNTER — Other Ambulatory Visit: Payer: Medicare Other

## 2018-09-23 ENCOUNTER — Inpatient Hospital Stay: Payer: Medicare Other | Admitting: Hematology

## 2018-09-23 ENCOUNTER — Inpatient Hospital Stay: Payer: Medicare Other | Attending: Internal Medicine

## 2019-01-10 ENCOUNTER — Telehealth: Payer: Self-pay

## 2019-01-10 NOTE — Telephone Encounter (Signed)
Per 1/31 patient walk in. Patient requested a appointment due to she missed her f/u in December

## 2019-01-22 ENCOUNTER — Inpatient Hospital Stay: Payer: Medicare Other | Attending: Hematology | Admitting: Hematology

## 2019-01-22 ENCOUNTER — Inpatient Hospital Stay: Payer: Medicare Other

## 2019-01-22 ENCOUNTER — Telehealth: Payer: Self-pay | Admitting: Hematology

## 2019-01-22 NOTE — Telephone Encounter (Signed)
Called patient per 2/12 sch message - unable to reach - left message for patient to call back if r/s is still needed.

## 2019-03-25 ENCOUNTER — Other Ambulatory Visit: Payer: Self-pay | Admitting: Hematology

## 2019-03-25 DIAGNOSIS — Z1231 Encounter for screening mammogram for malignant neoplasm of breast: Secondary | ICD-10-CM

## 2019-05-21 ENCOUNTER — Other Ambulatory Visit: Payer: Self-pay

## 2019-05-21 ENCOUNTER — Ambulatory Visit
Admission: RE | Admit: 2019-05-21 | Discharge: 2019-05-21 | Disposition: A | Discharge status: Home / Self Care | Payer: Medicare Other | Source: Ambulatory Visit | Attending: Hematology | Admitting: Hematology

## 2019-05-21 DIAGNOSIS — Z1231 Encounter for screening mammogram for malignant neoplasm of breast: Secondary | ICD-10-CM

## 2019-10-16 ENCOUNTER — Other Ambulatory Visit: Payer: Self-pay | Admitting: Family Medicine

## 2019-10-16 DIAGNOSIS — R1909 Other intra-abdominal and pelvic swelling, mass and lump: Secondary | ICD-10-CM

## 2020-01-04 ENCOUNTER — Ambulatory Visit: Payer: Medicare Other

## 2020-04-12 ENCOUNTER — Other Ambulatory Visit: Payer: Self-pay | Admitting: Women's Health

## 2020-04-12 ENCOUNTER — Other Ambulatory Visit: Payer: Self-pay | Admitting: Hematology

## 2020-04-12 DIAGNOSIS — Z1231 Encounter for screening mammogram for malignant neoplasm of breast: Secondary | ICD-10-CM

## 2020-05-18 ENCOUNTER — Telehealth: Payer: Self-pay | Admitting: Hematology

## 2020-05-18 ENCOUNTER — Telehealth: Payer: Self-pay

## 2020-05-18 ENCOUNTER — Encounter: Payer: Medicare PPO | Admitting: Medical

## 2020-05-18 DIAGNOSIS — L930 Discoid lupus erythematosus: Secondary | ICD-10-CM | POA: Insufficient documentation

## 2020-05-18 NOTE — Telephone Encounter (Signed)
TC from Pt stating that she has a rash under her left breast she stated that it was red and painful. Informed provider. Pt. Referred to see her PCP or Urgent Care. Pt stated she would like to schedule an appointment with Dr. Burr Medico scheduling message sent.

## 2020-05-18 NOTE — Telephone Encounter (Signed)
Scheduled appt per 6/8 sch message - unable to reach pt . Left message for patient with appt date and time

## 2020-05-21 ENCOUNTER — Ambulatory Visit: Payer: Medicare PPO

## 2020-05-24 NOTE — Progress Notes (Signed)
[place]   Telephone:([phone] Fax:(336) [phone]   Clinic Follow up Note   Patient Care Team: Charolette Forward, MD as PCP - General (Cardiology) Charolette Forward, MD as Consulting Physician (Cardiology)  Date of Service:  05/25/2020  CHIEF COMPLAINT: Follow up right breast cancer  SUMMARY OF ONCOLOGIC HISTORY: Oncology History Overview Note  Breast cancer of upper-outer quadrant of right female breast   Staging form: Breast, AJCC 7th Edition     Pathologic stage from 09/01/2014: Stage IA (T1a(m), N0, cM0) - Unsigned     Breast cancer of upper-outer quadrant of right female breast ([place])  05/21/2014 Initial Biopsy   Right breast biopsy showed grade 2 DCIS with papillary features, ER/PR strongly positive.   05/21/2014 Receptors her2   ER 93%+, PR 100%   05/21/2014 Initial Diagnosis   Breast cancer of upper-outer quadrant of right female breast   09/01/2014 Surgery   Right mastectomy with sentinel lymph node biopsy, surgical margins were negative. Left lumpectomy showed fibrocystic changes.     PATHOLOGY REPORT Pathology: ACCESSION NUMBER:  [id number] RECEIVED: 09/01/2014 ORDERING PHYSICIAN:  Angelina Ok , MD PATIENT NAME:  [name], [name] SURGICAL PATHOLOGY REPORT  FINAL PATHOLOGIC DIAGNOSIS MICROSCOPIC EXAMINATION AND DIAGNOSIS  A.  SENTINEL LYMPH NODE, RIGHT AXILLA, #1, EXCISION:      One benign lymph node (0/1).  B.  SENTINEL LYMPH NODE, RIGHT AXILLA, #2, EXCISION:       One benign lymph node (0/1).  C.  BREAST, RIGHT, MASTECTOMY:      Invasive ductal carcinoma, two foci, 0.3 and 0.2 cm, arising in background of ductal carcinoma in situ, intermediate-high nuclear grade, papillary growth pattern with necrosis and calcifications.      Previous biopsy site changes.      Margins negative for malignancy.      Unremarkable skin.      See comment and cancer protocol.       D.  BREAST, LEFT, NEEDLE LOCALIZED LUMPECTOMY:      Fibrocystic  changes including stromal fibrosis, cysts formation, apocrine metaplasia, columnar cell alteration and usual ductal hyperplasia. Negative for atypia and carcinoma.      Previous biopsy site changes.   10/11/2014 -  Anti-estrogen oral therapy   Exemestane 25 mg once daily since 10/2014. Stopped 08/2018. Restarted 05/25/20 for 1 more year.   02/09/2016 Imaging   MM Screening breast tomo Uni L IMPRESSION: No mammographic evidence of malignancy. A result letter of this screening mammogram will be mailed directly to the patient.   04/17/2017 Mammogram   negative   04/17/2017 Imaging   DEXA Scan measured at Femur Neck Right is 0.927 g/cm2 with a T-score of -0.8.      CURRENT THERAPY:  Exemestane 25 mg once daily since 10/2014. Stopped 08/2018. Restarted 05/25/20 for 1 more year.   INTERVAL HISTORY:  [name] is here for a follow up of right breast cancer. She was last seen by me in 02/2018. She presents to the clinic alone. She notes she lost follow up after her sister was diagnosed with lung cancer. She has been helping her and she is being treated by Dr Julien Nordmann. She notes she has continued Exemestane and tolerated well, but ran out after 08/2018. She notes 1 week ago she noticed pain under her left breast. She went to Urgent care and given topical cream for fungal infection. This has helped. She plans to have her next Mammogram in 2 days. I reviewed her mediation list with her. She has  been on Ambien as needed and getting refills by her cardiologist. She notes she does not have PCP and has been to urgent cares as needed. She is interested in finding new PCP. I reviewed her medication list with her. She is no longer on Effexor.    REVIEW OF SYSTEMS:   Constitutional: Denies fevers, chills or abnormal weight loss Eyes: Denies blurriness of vision Ears, nose, mouth, throat, and face: Denies mucositis or sore throat Respiratory: Denies cough, dyspnea or wheezes Cardiovascular: Denies  palpitation, chest discomfort or lower extremity swelling Gastrointestinal:  Denies nausea, heartburn or change in bowel habits Skin: Denies abnormal skin rashes Lymphatics: Denies new lymphadenopathy or easy bruising Neurological:Denies numbness, tingling or new weaknesses Behavioral/Psych: Mood is stable, no new changes  All other systems were reviewed with the patient and are negative.  MEDICAL HISTORY:  Past Medical History:  Diagnosis Date  . Allergic rhinitis   . Anginal pain ([place])   . Arthritis   . Asthma   . Coronary artery disease   . Emphysema of lung ([place])   . GERD (gastroesophageal reflux disease)   . Hypertension   . Lupus (systemic lupus erythematosus) ([place])   . Myocardial infarction [facility])     SURGICAL HISTORY: Past Surgical History:  Procedure Laterality Date  . [place]  . MASTECTOMY Right 2015    I have reviewed the social history and family history with the patient and they are unchanged from previous note.  ALLERGIES:  is allergic to lipitor [atorvastatin].  MEDICATIONS:  Current Outpatient Medications  Medication Sig Dispense Refill  . ALPRAZolam (XANAX) 0.25 MG tablet Take 0.25 mg by mouth 2 (two) times daily as needed.     Marland Kitchen aspirin EC 81 MG EC tablet Take 1 tablet (81 mg total) by mouth daily. 30 tablet 3  . exemestane (AROMASIN) 25 MG tablet Take 1 tablet (25 mg total) by mouth daily. 90 tablet 3  . isosorbide mononitrate (IMDUR) 30 MG 24 hr tablet Take 30 mg by mouth daily.      Marland Kitchen KLOR-CON M20 20 MEQ tablet TAKE 1 TABLET BY MOUTH TWICE A DAY 60 tablet 0  . metoprolol succinate (TOPROL-XL) 50 MG 24 hr tablet Take 50 mg by mouth daily. Take with or immediately following a meal.    . nitroGLYCERIN (NITROSTAT) 0.4 MG SL tablet Place 0.4 mg under the tongue every 5 (five) minutes as needed. Reported on 01/11/2016    . nystatin cream (MYCOSTATIN)     . pantoprazole (PROTONIX) 40 MG tablet Take 40 mg by mouth daily.  3  .  pravastatin (PRAVACHOL) 40 MG tablet Take 40 mg by mouth at bedtime.    . ramipril (ALTACE) 10 MG capsule Take 10 mg by mouth 2 (two) times daily.     Marland Kitchen venlafaxine XR (EFFEXOR XR) 37.5 MG 24 hr capsule Take 1 capsule (37.5 mg total) by mouth daily with breakfast. 60 capsule 1   No current facility-administered medications for this visit.    PHYSICAL EXAMINATION: ECOG PERFORMANCE STATUS: 0 - Asymptomatic  Vitals:   05/25/20 1232  BP: (!) 175/73  Pulse: 76  Resp: 20  Temp: 98.3 F (36.8 C)  SpO2: 100%   Filed Weights   05/25/20 1232  Weight: 181 lb 9.6 oz (82.4 kg)    GENERAL:alert, no distress and comfortable SKIN: skin color, texture, turgor are normal, no rashes or significant lesions EYES: normal, Conjunctiva are pink and non-injected, sclera clear  NECK: supple, thyroid normal  size, non-tender, without nodularity LYMPH:  no palpable lymphadenopathy in the cervical, axillary  LUNGS: clear to auscultation and percussion with normal breathing effort HEART: regular rate & rhythm and no murmurs and no lower extremity edema ABDOMEN:abdomen soft, non-tender and normal bowel sounds Musculoskeletal:no cyanosis of digits and no clubbing  NEURO: alert & oriented x 3 with fluent speech, no focal motor/sensory deficits BREAST: S/p right mastectomy and left lumpectomy: Surgical incisions healed well (+) Healing fungal infection under left breast. No palpable mass, nodules or adenopathy bilaterally. Breast exam benign.   LABORATORY DATA:  I have reviewed the data as listed CBC Latest Ref Rng & Units 02/27/2018 08/30/2017 03/01/2017  WBC 3.9 - 10.3 K/uL 6.2 5.6 8.4  Hemoglobin 11.6 - 15.9 g/dL 14.7 14.1 14.1  Hematocrit 34 - 46 % 43.8 42.1 42.6  Platelets 145 - 400 K/uL 251 259 313     CMP Latest Ref Rng & Units 02/27/2018 08/30/2017 03/01/2017  Glucose 70 - 140 mg/dL 101 108 95  BUN 7 - 26 mg/dL 7 9.6 12.6  Creatinine 0.60 - 1.10 mg/dL 0.89 0.8 0.8  Sodium 136 - 145 mmol/L 141 142  140  Potassium 3.5 - 5.1 mmol/L 3.2(L) 3.2(L) 3.8  Chloride 98 - 109 mmol/L 108 - -  CO2 22 - 29 mmol/L 25 20(L) 21(L)  Calcium 8.4 - 10.4 mg/dL 9.3 9.4 9.4  Total Protein 6.4 - 8.3 g/dL 6.9 6.9 7.2  Total Bilirubin 0.2 - 1.2 mg/dL 0.7 0.32 0.41  Alkaline Phos 40 - 150 U/L 79 86 86  AST 5 - 34 U/L 50(H) 27 33  ALT 0 - 55 U/L 49 22 43      RADIOGRAPHIC STUDIES: I have personally reviewed the radiological images as listed and agreed with the findings in the report. No results found.   ASSESSMENT & PLAN:  [name] is a 74 y.o. female with    1. Right breast multifocal invasive ductal carcinoma, pT1aN0M0, stage IA, ER+/PR+, and background DCIS  -She was diagnosed in 05/2014. She is s/p right mastectomy, left lumpectomy.  -She was started on antiestrogen therapy with Exemestane in 10/2014. Given her early stage of cancer, I think that 5 years of adjuvant therapy would be adequate. -She lost f/u after 02/2018 and ran out of Exemestane after 08/2018. She has not taken it since. I discussed she is 1 year short of her 5 year treatment. I discussed restarting Exemestane to complete 1 more year in 05/2021. She is agreeable.  -She is clinically doing well. Her physical exam and her 05/2019 mammogram were unremarkable. There is no clinical concern for recurrence. She will proceed with labs today.  -Continue Surveillance. Next mammogram on 05/27/20.  -Restart Exemestane.  -F/u in 6 months. I encouraged her to establish a new PCP. She is agreeable. I gave her contact and address for [place] and [facility].    2. Iron deficient anemia -She had a study at [facility] in February 2016, which showed iron 19, ferritin 8, TIBC 460, and saturation 4%. This is consistent with iron deficient anemia. -Her anemia has resolved. I encouraged her to continue oral iron  -she recently had EGD and COLONOSCOPY at [facility], which showed mild gastritis and benign polyps  - Anemia  has resolved now   3. Pulmonary nodules -She was found to have a 3 mm noncalcified nodule in the right lung base. Repeat CT scan on 06/08/2015 showed no change, and additional smaller calcified and noncalcified nodules  are all stable. -She has long-standing smoking history, but quit in May 2015.  4. HTN, CAD, asthma, GERD -She'll continue follow-up with her Cardiologist. She will establish care with a new PCP.   5. Bone health -We previously discussed that AI may weak her bone.  -Her 04/17/17 DEXA was normal with lowest T-score -0.8 at right femur neck. She is overdue, will schedule to be done in the next 6 months.  -I previously encouraged her to take calcium and vitamin D.   Plan  -Labs today  -Restart exemestane, refilled today  -Mammogram on 6/17 scheduled  -DEXA at [facility] to be done in the next 6 months.  -Lab and F/u in 6 months    No problem-specific Assessment & Plan notes found for this encounter.   Orders Placed This Encounter  Procedures  . DG Bone Density    Standing Status:   Future    Standing Expiration Date:   05/25/2021    Order Specific Question:   Reason for Exam (SYMPTOM  OR DIAGNOSIS REQUIRED)    Answer:   screening    Order Specific Question:   Preferred imaging location?    Answer:   [facility]   All questions were answered. The patient knows to call the clinic with any problems, questions or concerns. No barriers to learning was detected. The total time spent in the appointment was 25 minutes.     Truitt Merle, MD 05/25/2020   I, Joslyn Devon, am acting as scribe for Truitt Merle, MD.   I have reviewed the above documentation for accuracy and completeness, and I agree with the above.

## 2020-05-25 ENCOUNTER — Other Ambulatory Visit: Payer: Self-pay

## 2020-05-25 ENCOUNTER — Telehealth: Payer: Self-pay | Admitting: Hematology

## 2020-05-25 ENCOUNTER — Inpatient Hospital Stay: Payer: Medicare PPO

## 2020-05-25 ENCOUNTER — Encounter: Payer: Self-pay | Admitting: Hematology

## 2020-05-25 ENCOUNTER — Inpatient Hospital Stay: Payer: Medicare PPO | Attending: Hematology | Admitting: Hematology

## 2020-05-25 VITALS — BP 175/73 | HR 76 | Temp 98.3°F | Resp 20 | Ht 66.0 in | Wt 181.6 lb

## 2020-05-25 DIAGNOSIS — I252 Old myocardial infarction: Secondary | ICD-10-CM | POA: Diagnosis not present

## 2020-05-25 DIAGNOSIS — Z79899 Other long term (current) drug therapy: Secondary | ICD-10-CM | POA: Diagnosis not present

## 2020-05-25 DIAGNOSIS — E2839 Other primary ovarian failure: Secondary | ICD-10-CM | POA: Diagnosis not present

## 2020-05-25 DIAGNOSIS — M329 Systemic lupus erythematosus, unspecified: Secondary | ICD-10-CM | POA: Diagnosis not present

## 2020-05-25 DIAGNOSIS — K219 Gastro-esophageal reflux disease without esophagitis: Secondary | ICD-10-CM | POA: Diagnosis not present

## 2020-05-25 DIAGNOSIS — Z79811 Long term (current) use of aromatase inhibitors: Secondary | ICD-10-CM | POA: Insufficient documentation

## 2020-05-25 DIAGNOSIS — M1990 Unspecified osteoarthritis, unspecified site: Secondary | ICD-10-CM | POA: Diagnosis not present

## 2020-05-25 DIAGNOSIS — J439 Emphysema, unspecified: Secondary | ICD-10-CM | POA: Insufficient documentation

## 2020-05-25 DIAGNOSIS — J45909 Unspecified asthma, uncomplicated: Secondary | ICD-10-CM | POA: Insufficient documentation

## 2020-05-25 DIAGNOSIS — C50411 Malignant neoplasm of upper-outer quadrant of right female breast: Secondary | ICD-10-CM | POA: Insufficient documentation

## 2020-05-25 DIAGNOSIS — Z7982 Long term (current) use of aspirin: Secondary | ICD-10-CM | POA: Insufficient documentation

## 2020-05-25 DIAGNOSIS — Z170 Estrogen receptor positive status [ER+]: Secondary | ICD-10-CM | POA: Insufficient documentation

## 2020-05-25 DIAGNOSIS — I10 Essential (primary) hypertension: Secondary | ICD-10-CM | POA: Insufficient documentation

## 2020-05-25 DIAGNOSIS — I2510 Atherosclerotic heart disease of native coronary artery without angina pectoris: Secondary | ICD-10-CM | POA: Diagnosis not present

## 2020-05-25 DIAGNOSIS — Z955 Presence of coronary angioplasty implant and graft: Secondary | ICD-10-CM | POA: Diagnosis not present

## 2020-05-25 DIAGNOSIS — Z9012 Acquired absence of left breast and nipple: Secondary | ICD-10-CM | POA: Insufficient documentation

## 2020-05-25 DIAGNOSIS — D649 Anemia, unspecified: Secondary | ICD-10-CM | POA: Insufficient documentation

## 2020-05-25 MED ORDER — EXEMESTANE 25 MG PO TABS: 25.0000 mg | ORAL_TABLET | Freq: Every day | ORAL | 3 refills | Status: DC

## 2020-05-25 NOTE — Telephone Encounter (Signed)
Scheduled per los. Gave avs and calendar  

## 2020-05-27 ENCOUNTER — Ambulatory Visit
Admission: RE | Admit: 2020-05-27 | Discharge: 2020-05-27 | Disposition: A | Discharge status: Home / Self Care | Payer: Medicare PPO | Source: Ambulatory Visit | Attending: Hematology | Admitting: Hematology

## 2020-05-27 ENCOUNTER — Other Ambulatory Visit: Payer: Self-pay

## 2020-05-27 DIAGNOSIS — Z1231 Encounter for screening mammogram for malignant neoplasm of breast: Secondary | ICD-10-CM

## 2020-08-24 ENCOUNTER — Other Ambulatory Visit: Payer: Medicare PPO

## 2020-10-14 ENCOUNTER — Ambulatory Visit: Payer: Medicare PPO | Attending: Internal Medicine

## 2020-10-14 DIAGNOSIS — Z23 Encounter for immunization: Secondary | ICD-10-CM

## 2020-10-14 NOTE — Progress Notes (Signed)
   Covid-19 Vaccination Clinic  Name:  [name]    MRN: [id number] DOB: 03/13/1946  10/14/2020  [name] was observed post Covid-19 immunization for 15 minutes without incident. She was provided with Vaccine Information Sheet and instruction to access the V-Safe system.   [name] was instructed to call 911 with any severe reactions post vaccine: Marland Kitchen Difficulty breathing  . Swelling of face and throat  . A fast heartbeat  . A bad rash all over body  . Dizziness and weakness

## 2020-11-22 ENCOUNTER — Telehealth: Payer: Self-pay | Admitting: Hematology

## 2020-11-22 NOTE — Telephone Encounter (Signed)
Rescheduled appt per 12/13 phone call from pt requesting to reschedule appts due to death in the family. Pt confirmed new appt date and time.

## 2020-11-23 NOTE — Progress Notes (Deleted)
[name]   Telephone:(336) [phone] Fax:(336) [phone]   Clinic Follow up Note   Patient Care Team: Charolette Forward, MD as PCP - General (Cardiology) Charolette Forward, MD as Consulting Physician (Cardiology) 11/23/2020  CHIEF COMPLAINT: Follow up right breast cancer   SUMMARY OF ONCOLOGIC HISTORY: Oncology History Overview Note  Breast cancer of upper-outer quadrant of right female breast   Staging form: Breast, AJCC 7th Edition     Pathologic stage from 09/01/2014: Stage IA (T1a(m), N0, cM0) - Unsigned     Breast cancer of upper-outer quadrant of right female breast ([place])  05/21/2014 Initial Biopsy   Right breast biopsy showed grade 2 DCIS with papillary features, ER/PR strongly positive.   05/21/2014 Receptors her2   ER 93%+, PR 100%   05/21/2014 Initial Diagnosis   Breast cancer of upper-outer quadrant of right female breast   09/01/2014 Surgery   Right mastectomy with sentinel lymph node biopsy, surgical margins were negative. Left lumpectomy showed fibrocystic changes.     PATHOLOGY REPORT Pathology: ACCESSION NUMBER:  [id number] RECEIVED: 09/01/2014 ORDERING PHYSICIAN:  Angelina Ok , MD PATIENT NAME:  Hall, Ana SURGICAL PATHOLOGY REPORT  FINAL PATHOLOGIC DIAGNOSIS MICROSCOPIC EXAMINATION AND DIAGNOSIS  A.  SENTINEL LYMPH NODE, RIGHT AXILLA, #1, EXCISION:      One benign lymph node (0/1).  B.  SENTINEL LYMPH NODE, RIGHT AXILLA, #2, EXCISION:       One benign lymph node (0/1).  C.  BREAST, RIGHT, MASTECTOMY:      Invasive ductal carcinoma, two foci, 0.3 and 0.2 cm, arising in background of ductal carcinoma in situ, intermediate-high nuclear grade, papillary growth pattern with necrosis and calcifications.      Previous biopsy site changes.      Margins negative for malignancy.      Unremarkable skin.      See comment and cancer protocol.       D.  BREAST, LEFT, NEEDLE LOCALIZED LUMPECTOMY:      Fibrocystic changes including  stromal fibrosis, cysts formation, apocrine metaplasia, columnar cell alteration and usual ductal hyperplasia. Negative for atypia and carcinoma.      Previous biopsy site changes.   10/11/2014 -  Anti-estrogen oral therapy   Exemestane 25 mg once daily since 10/2014. Stopped 08/2018. Restarted 05/25/20 for 1 more year.   02/09/2016 Imaging   MM Screening breast tomo Uni L IMPRESSION: No mammographic evidence of malignancy. A result letter of this screening mammogram will be mailed directly to the patient.   04/17/2017 Mammogram   negative   04/17/2017 Imaging   DEXA Scan measured at Femur Neck Right is 0.927 g/cm2 with a T-score of -0.8.     CURRENT THERAPY: Exemestane 25 mg once daily since 10/2014. Stopped 08/2018. Restarted 05/25/20 for 1 more year.   INTERVAL HISTORY: Ms. Orser returns for follow up as scheduled. She was last seen by Dr. Burr Medico on 05/25/20.    REVIEW OF SYSTEMS:   Constitutional: Denies fevers, chills or abnormal weight loss Eyes: Denies blurriness of vision Ears, nose, mouth, throat, and face: Denies mucositis or sore throat Respiratory: Denies cough, dyspnea or wheezes Cardiovascular: Denies palpitation, chest discomfort or lower extremity swelling Gastrointestinal:  Denies nausea, heartburn or change in bowel habits Skin: Denies abnormal skin rashes Lymphatics: Denies new lymphadenopathy or easy bruising Neurological:Denies numbness, tingling or new weaknesses Behavioral/Psych: Mood is stable, no new changes  All other systems were reviewed with the patient and are negative.  MEDICAL HISTORY:  Past Medical History:  Diagnosis Date  .  Allergic rhinitis   . Anginal pain ([place])   . Arthritis   . Asthma   . Breast cancer ([place]) 2015   Right Breast Cancer  . Coronary artery disease   . Emphysema of lung ([place])   . GERD (gastroesophageal reflux disease)   . Hypertension   . Lupus (systemic lupus erythematosus) ([place])   . Myocardial infarction [facility])      SURGICAL HISTORY: Past Surgical History:  Procedure Laterality Date  . [place]  . MASTECTOMY Right 2015    I have reviewed the social history and family history with the patient and they are unchanged from previous note.  ALLERGIES:  is allergic to lipitor [atorvastatin].  MEDICATIONS:  Current Outpatient Medications  Medication Sig Dispense Refill  . ALPRAZolam (XANAX) 0.25 MG tablet Take 0.25 mg by mouth 2 (two) times daily as needed.     Marland Kitchen aspirin EC 81 MG EC tablet Take 1 tablet (81 mg total) by mouth daily. 30 tablet 3  . exemestane (AROMASIN) 25 MG tablet Take 1 tablet (25 mg total) by mouth daily. 90 tablet 3  . isosorbide mononitrate (IMDUR) 30 MG 24 hr tablet Take 30 mg by mouth daily.      Marland Kitchen KLOR-CON M20 20 MEQ tablet TAKE 1 TABLET BY MOUTH TWICE A DAY 60 tablet 0  . metoprolol succinate (TOPROL-XL) 50 MG 24 hr tablet Take 50 mg by mouth daily. Take with or immediately following a meal.    . nitroGLYCERIN (NITROSTAT) 0.4 MG SL tablet Place 0.4 mg under the tongue every 5 (five) minutes as needed. Reported on 01/11/2016    . nystatin cream (MYCOSTATIN)     . pantoprazole (PROTONIX) 40 MG tablet Take 40 mg by mouth daily.  3  . pravastatin (PRAVACHOL) 40 MG tablet Take 40 mg by mouth at bedtime.    . ramipril (ALTACE) 10 MG capsule Take 10 mg by mouth 2 (two) times daily.     Marland Kitchen venlafaxine XR (EFFEXOR XR) 37.5 MG 24 hr capsule Take 1 capsule (37.5 mg total) by mouth daily with breakfast. 60 capsule 1   No current facility-administered medications for this visit.    PHYSICAL EXAMINATION: ECOG PERFORMANCE STATUS: {CHL ONC ECOG PS:[id number]}  There were no vitals filed for this visit. There were no vitals filed for this visit.  GENERAL:alert, no distress and comfortable SKIN: skin color, texture, turgor are normal, no rashes or significant lesions EYES: normal, Conjunctiva are pink and non-injected, sclera clear OROPHARYNX:no exudate, no  erythema and lips, buccal mucosa, and tongue normal  NECK: supple, thyroid normal size, non-tender, without nodularity LYMPH:  no palpable lymphadenopathy in the cervical, axillary or inguinal LUNGS: clear to auscultation and percussion with normal breathing effort HEART: regular rate & rhythm and no murmurs and no lower extremity edema ABDOMEN:abdomen soft, non-tender and normal bowel sounds Musculoskeletal:no cyanosis of digits and no clubbing  NEURO: alert & oriented x 3 with fluent speech, no focal motor/sensory deficits  LABORATORY DATA:  I have reviewed the data as listed CBC Latest Ref Rng & Units 02/27/2018 08/30/2017 03/01/2017  WBC 3.9 - 10.3 K/uL 6.2 5.6 8.4  Hemoglobin 11.6 - 15.9 g/dL 14.7 14.1 14.1  Hematocrit 34.8 - 46.6 % 43.8 42.1 42.6  Platelets 145 - 400 K/uL 251 259 313     CMP Latest Ref Rng & Units 02/27/2018 08/30/2017 03/01/2017  Glucose 70 - 140 mg/dL 101 108 95  BUN 7 - 26 mg/dL 7 9.6 12.6  Creatinine  0.60 - 1.10 mg/dL 0.89 0.8 0.8  Sodium 136 - 145 mmol/L 141 142 140  Potassium 3.5 - 5.1 mmol/L 3.2(L) 3.2(L) 3.8  Chloride 98 - 109 mmol/L 108 - -  CO2 22 - 29 mmol/L 25 20(L) 21(L)  Calcium 8.4 - 10.4 mg/dL 9.3 9.4 9.4  Total Protein 6.4 - 8.3 g/dL 6.9 6.9 7.2  Total Bilirubin 0.2 - 1.2 mg/dL 0.7 0.32 0.41  Alkaline Phos 40 - 150 U/L 79 86 86  AST 5 - 34 U/L 50(H) 27 33  ALT 0 - 55 U/L 49 22 43      RADIOGRAPHIC STUDIES: I have personally reviewed the radiological images as listed and agreed with the findings in the report. No results found.   ASSESSMENT & PLAN:  No problem-specific Assessment & Plan notes found for this encounter.   No orders of the defined types were placed in this encounter.  All questions were answered. The patient knows to call the clinic with any problems, questions or concerns. No barriers to learning was detected. I spent {CHL ONC TIME VISIT - NKNLZ:[id number]} counseling the patient face to face. The total time spent in the  appointment was {CHL ONC TIME VISIT - KWIOX:[id number]} and more than 50% was on counseling and review of test results     Alla Feeling, NP 11/23/20

## 2020-11-24 ENCOUNTER — Inpatient Hospital Stay: Payer: Medicare PPO

## 2020-11-24 ENCOUNTER — Inpatient Hospital Stay: Payer: Medicare PPO | Attending: Nurse Practitioner | Admitting: Nurse Practitioner

## 2020-12-13 ENCOUNTER — Telehealth: Payer: Self-pay | Admitting: Hematology

## 2020-12-13 NOTE — Progress Notes (Incomplete)
[name]   Telephone:(336) [phone] Fax:([phone]   Clinic Follow up Note   Patient Care Team: Charolette Forward, MD as PCP - General (Cardiology) Charolette Forward, MD as Consulting Physician (Cardiology)  Date of Service:  12/13/2020  CHIEF COMPLAINT: Follow up right breast cancer  SUMMARY OF ONCOLOGIC HISTORY: Oncology History Overview Note  Breast cancer of upper-outer quadrant of right female breast   Staging form: Breast, AJCC 7th Edition     Pathologic stage from 09/01/2014: Stage IA (T1a(m), N0, cM0) - Unsigned     Breast cancer of upper-outer quadrant of right female breast ([place])  05/21/2014 Initial Biopsy   Right breast biopsy showed grade 2 DCIS with papillary features, ER/PR strongly positive.   05/21/2014 Receptors her2   ER 93%+, PR 100%   05/21/2014 Initial Diagnosis   Breast cancer of upper-outer quadrant of right female breast   09/01/2014 Surgery   Right mastectomy with sentinel lymph node biopsy, surgical margins were negative. Left lumpectomy showed fibrocystic changes.     PATHOLOGY REPORT Pathology: ACCESSION NUMBER:  [id number] RECEIVED: 09/01/2014 ORDERING PHYSICIAN:  Angelina Ok , MD PATIENT NAME:  Hall, Ana SURGICAL PATHOLOGY REPORT  FINAL PATHOLOGIC DIAGNOSIS MICROSCOPIC EXAMINATION AND DIAGNOSIS  A.  SENTINEL LYMPH NODE, RIGHT AXILLA, #1, EXCISION:      One benign lymph node (0/1).  B.  SENTINEL LYMPH NODE, RIGHT AXILLA, #2, EXCISION:       One benign lymph node (0/1).  C.  BREAST, RIGHT, MASTECTOMY:      Invasive ductal carcinoma, two foci, 0.3 and 0.2 cm, arising in background of ductal carcinoma in situ, intermediate-high nuclear grade, papillary growth pattern with necrosis and calcifications.      Previous biopsy site changes.      Margins negative for malignancy.      Unremarkable skin.      See comment and cancer protocol.       D.  BREAST, LEFT, NEEDLE LOCALIZED LUMPECTOMY:      Fibrocystic  changes including stromal fibrosis, cysts formation, apocrine metaplasia, columnar cell alteration and usual ductal hyperplasia. Negative for atypia and carcinoma.      Previous biopsy site changes.   10/11/2014 -  Anti-estrogen oral therapy   Exemestane 25 mg once daily since 10/2014. Stopped 08/2018. Restarted 05/25/20 for 1 more year.   02/09/2016 Imaging   MM Screening breast tomo Uni L IMPRESSION: No mammographic evidence of malignancy. A result letter of this screening mammogram will be mailed directly to the patient.   04/17/2017 Mammogram   negative   04/17/2017 Imaging   DEXA Scan measured at Femur Neck Right is 0.927 g/cm2 with a T-score of -0.8.      CURRENT THERAPY:  Exemestane 25 mg once daily since 10/2014. Stopped 08/2018. Restarted 05/25/20 for 1 more year.   INTERVAL HISTORY: *** [name] is here for a follow up of right breast cancer. She was last seen by me 7 months ago. She presents to the clinic alone.   REVIEW OF SYSTEMS:  *** Constitutional: Denies fevers, chills or abnormal weight loss Eyes: Denies blurriness of vision Ears, nose, mouth, throat, and face: Denies mucositis or sore throat Respiratory: Denies cough, dyspnea or wheezes Cardiovascular: Denies palpitation, chest discomfort or lower extremity swelling Gastrointestinal:  Denies nausea, heartburn or change in bowel habits Skin: Denies abnormal skin rashes Lymphatics: Denies new lymphadenopathy or easy bruising Neurological:Denies numbness, tingling or new weaknesses Behavioral/Psych: Mood is stable, no new changes  All other systems were  reviewed with the patient and are negative.  MEDICAL HISTORY:  Past Medical History:  Diagnosis Date  . Allergic rhinitis   . Anginal pain ([place])   . Arthritis   . Asthma   . Breast cancer ([place]) 2015   Right Breast Cancer  . Coronary artery disease   . Emphysema of lung ([place])   . GERD (gastroesophageal reflux disease)   . Hypertension   . Lupus  (systemic lupus erythematosus) ([place])   . Myocardial infarction [facility])     SURGICAL HISTORY: Past Surgical History:  Procedure Laterality Date  . [place]  . MASTECTOMY Right 2015    I have reviewed the social history and family history with the patient and they are unchanged from previous note.  ALLERGIES:  is allergic to lipitor [atorvastatin].  MEDICATIONS:  Current Outpatient Medications  Medication Sig Dispense Refill  . ALPRAZolam (XANAX) 0.25 MG tablet Take 0.25 mg by mouth 2 (two) times daily as needed.     Marland Kitchen aspirin EC 81 MG EC tablet Take 1 tablet (81 mg total) by mouth daily. 30 tablet 3  . exemestane (AROMASIN) 25 MG tablet Take 1 tablet (25 mg total) by mouth daily. 90 tablet 3  . isosorbide mononitrate (IMDUR) 30 MG 24 hr tablet Take 30 mg by mouth daily.      Marland Kitchen KLOR-CON M20 20 MEQ tablet TAKE 1 TABLET BY MOUTH TWICE A DAY 60 tablet 0  . metoprolol succinate (TOPROL-XL) 50 MG 24 hr tablet Take 50 mg by mouth daily. Take with or immediately following a meal.    . nitroGLYCERIN (NITROSTAT) 0.4 MG SL tablet Place 0.4 mg under the tongue every 5 (five) minutes as needed. Reported on 01/11/2016    . nystatin cream (MYCOSTATIN)     . pantoprazole (PROTONIX) 40 MG tablet Take 40 mg by mouth daily.  3  . pravastatin (PRAVACHOL) 40 MG tablet Take 40 mg by mouth at bedtime.    . ramipril (ALTACE) 10 MG capsule Take 10 mg by mouth 2 (two) times daily.     Marland Kitchen venlafaxine XR (EFFEXOR XR) 37.5 MG 24 hr capsule Take 1 capsule (37.5 mg total) by mouth daily with breakfast. 60 capsule 1   No current facility-administered medications for this visit.    PHYSICAL EXAMINATION: ECOG PERFORMANCE STATUS: {CHL ONC ECOG PS:[id number]}  There were no vitals filed for this visit. There were no vitals filed for this visit. *** GENERAL:alert, no distress and comfortable SKIN: skin color, texture, turgor are normal, no rashes or significant lesions EYES: normal, Conjunctiva  are pink and non-injected, sclera clear {OROPHARYNX:no exudate, no erythema and lips, buccal mucosa, and tongue normal}  NECK: supple, thyroid normal size, non-tender, without nodularity LYMPH:  no palpable lymphadenopathy in the cervical, axillary {or inguinal} LUNGS: clear to auscultation and percussion with normal breathing effort HEART: regular rate & rhythm and no murmurs and no lower extremity edema ABDOMEN:abdomen soft, non-tender and normal bowel sounds Musculoskeletal:no cyanosis of digits and no clubbing  NEURO: alert & oriented x 3 with fluent speech, no focal motor/sensory deficits  LABORATORY DATA:  I have reviewed the data as listed CBC Latest Ref Rng & Units 02/27/2018 08/30/2017 03/01/2017  WBC 3.9 - 10.3 K/uL 6.2 5.6 8.4  Hemoglobin 11.6 - 15.9 g/dL 14.7 14.1 14.1  Hematocrit 34.8 - 46.6 % 43.8 42.1 42.6  Platelets 145 - 400 K/uL 251 259 313     CMP Latest Ref Rng & Units 02/27/2018 08/30/2017 03/01/2017  Glucose 70 - 140 mg/dL 101 108 95  BUN 7 - 26 mg/dL 7 9.6 12.6  Creatinine 0.60 - 1.10 mg/dL 0.89 0.8 0.8  Sodium 136 - 145 mmol/L 141 142 140  Potassium 3.5 - 5.1 mmol/L 3.2(L) 3.2(L) 3.8  Chloride 98 - 109 mmol/L 108 - -  CO2 22 - 29 mmol/L 25 20(L) 21(L)  Calcium 8.4 - 10.4 mg/dL 9.3 9.4 9.4  Total Protein 6.4 - 8.3 g/dL 6.9 6.9 7.2  Total Bilirubin 0.2 - 1.2 mg/dL 0.7 0.32 0.41  Alkaline Phos 40 - 150 U/L 79 86 86  AST 5 - 34 U/L 50(H) 27 33  ALT 0 - 55 U/L 49 22 43      RADIOGRAPHIC STUDIES: I have personally reviewed the radiological images as listed and agreed with the findings in the report. No results found.   ASSESSMENT & PLAN:  [name] is a 75 y.o. female with   1. Right breast multifocal invasive ductal carcinoma, pT1aN0M0, stage IA, ER+/PR+, and background DCIS  -She was diagnosed in 05/2014. She is s/p right mastectomy, left lumpectomy.  -She was started on antiestrogen therapy with Exemestane in 10/2014. Continue for 5 years.  -She  lost f/u after 02/2018 and ran out of Exemestane after 08/2018. She restarted in 05/2020. Plan to complete in 05/2021.  *** -She is clinically doing well. Lab reviewed, her CBC and CMP are within normal limits. Her physical exam and her 05/2020 mammogram were unremarkable. There is no clinical concern for recurrence.    2. HTN, CAD, asthma, GERD -She'll continue follow-up with her Cardiologist. She will establish care with a new PCP.   3. Bone health -We previously discussed that AI may weak her bone.  -Her 04/17/17 DEXA was normal with lowest T-score -0.8 at right femur neck. She is overdue, will schedule to be done in the next 6 months. *** -I previously encouraged her to take calcium and vitamin D.   Plan  ***   No problem-specific Assessment & Plan notes found for this encounter.   No orders of the defined types were placed in this encounter.  All questions were answered. The patient knows to call the clinic with any problems, questions or concerns. No barriers to learning was detected. The total time spent in the appointment was {CHL ONC TIME VISIT - YIAXK:[id number]}.     Joslyn Devon 12/13/2020   Oneal Deputy, am acting as scribe for Truitt Merle, MD.   {Add scribe attestation statement}

## 2020-12-13 NOTE — Telephone Encounter (Signed)
Rescheduled appointment per 1/3 schedule message. Patient is aware of changes. 

## 2020-12-14 ENCOUNTER — Inpatient Hospital Stay: Payer: Medicare PPO | Admitting: Hematology

## 2020-12-14 ENCOUNTER — Inpatient Hospital Stay: Payer: Medicare PPO

## 2020-12-20 NOTE — Progress Notes (Incomplete)
[name]   [phone]) [phone] Fax:(336) [phone]   Clinic Follow up Note   Patient Care Team: Charolette Forward, MD as PCP - General (Cardiology) Charolette Forward, MD as Consulting Physician (Cardiology)  Date of Service:  12/20/2020  CHIEF COMPLAINT: Follow up right breast cancer  SUMMARY OF ONCOLOGIC HISTORY: Oncology History Overview Note  Breast cancer of upper-outer quadrant of right female breast   Staging form: Breast, AJCC 7th Edition     Pathologic stage from 09/01/2014: Stage IA (T1a(m), N0, cM0) - Unsigned     Breast cancer of upper-outer quadrant of right female breast ([place])  05/21/2014 Initial Biopsy   Right breast biopsy showed grade 2 DCIS with papillary features, ER/PR strongly positive.   05/21/2014 Receptors her2   ER 93%+, PR 100%   05/21/2014 Initial Diagnosis   Breast cancer of upper-outer quadrant of right female breast   09/01/2014 Surgery   Right mastectomy with sentinel lymph node biopsy, surgical margins were negative. Left lumpectomy showed fibrocystic changes.     PATHOLOGY REPORT Pathology: ACCESSION NUMBER:  [id number] RECEIVED: 09/01/2014 ORDERING PHYSICIAN:  Angelina Ok , MD PATIENT NAME:  Putz, Ailana SURGICAL PATHOLOGY REPORT  FINAL PATHOLOGIC DIAGNOSIS MICROSCOPIC EXAMINATION AND DIAGNOSIS  A.  SENTINEL LYMPH NODE, RIGHT AXILLA, #1, EXCISION:      One benign lymph node (0/1).  B.  SENTINEL LYMPH NODE, RIGHT AXILLA, #2, EXCISION:       One benign lymph node (0/1).  C.  BREAST, RIGHT, MASTECTOMY:      Invasive ductal carcinoma, two foci, 0.3 and 0.2 cm, arising in background of ductal carcinoma in situ, intermediate-high nuclear grade, papillary growth pattern with necrosis and calcifications.      Previous biopsy site changes.      Margins negative for malignancy.      Unremarkable skin.      See comment and cancer protocol.       D.  BREAST, LEFT, NEEDLE LOCALIZED LUMPECTOMY:      Fibrocystic  changes including stromal fibrosis, cysts formation, apocrine metaplasia, columnar cell alteration and usual ductal hyperplasia. Negative for atypia and carcinoma.      Previous biopsy site changes.   10/11/2014 -  Anti-estrogen oral therapy   Exemestane 25 mg once daily since 10/2014. Stopped 08/2018. Restarted 05/25/20 for 1 more year.   02/09/2016 Imaging   MM Screening breast tomo Uni L IMPRESSION: No mammographic evidence of malignancy. A result letter of this screening mammogram will be mailed directly to the patient.   04/17/2017 Mammogram   negative   04/17/2017 Imaging   DEXA Scan measured at Femur Neck Right is 0.927 g/cm2 with a T-score of -0.8.      CURRENT THERAPY:  Exemestane 25 mg once daily since 10/2014. Stopped 08/2018. Restarted 05/25/20 for 1 more year.   INTERVAL HISTORY: *** [name] is here for a follow up of right breast cancer. She was last seen by me 7 months ago. She presents to the clinic alone.    REVIEW OF SYSTEMS:  *** Constitutional: Denies fevers, chills or abnormal weight loss Eyes: Denies blurriness of vision Ears, nose, mouth, throat, and face: Denies mucositis or sore throat Respiratory: Denies cough, dyspnea or wheezes Cardiovascular: Denies palpitation, chest discomfort or lower extremity swelling Gastrointestinal:  Denies nausea, heartburn or change in bowel habits Skin: Denies abnormal skin rashes Lymphatics: Denies new lymphadenopathy or easy bruising Neurological:Denies numbness, tingling or new weaknesses Behavioral/Psych: Mood is stable, no new changes  All other systems  were reviewed with the patient and are negative.  MEDICAL HISTORY:  Past Medical History:  Diagnosis Date  . Allergic rhinitis   . Anginal pain ([place])   . Arthritis   . Asthma   . Breast cancer ([place]) 2015   Right Breast Cancer  . Coronary artery disease   . Emphysema of lung ([place])   . GERD (gastroesophageal reflux disease)   . Hypertension   . Lupus  (systemic lupus erythematosus) ([place])   . Myocardial infarction [facility])     SURGICAL HISTORY: Past Surgical History:  Procedure Laterality Date  . [place]  . MASTECTOMY Right 2015    I have reviewed the social history and family history with the patient and they are unchanged from previous note.  ALLERGIES:  is allergic to lipitor [atorvastatin].  MEDICATIONS:  Current Outpatient Medications  Medication Sig Dispense Refill  . ALPRAZolam (XANAX) 0.25 MG tablet Take 0.25 mg by mouth 2 (two) times daily as needed.     Marland Kitchen aspirin EC 81 MG EC tablet Take 1 tablet (81 mg total) by mouth daily. 30 tablet 3  . exemestane (AROMASIN) 25 MG tablet Take 1 tablet (25 mg total) by mouth daily. 90 tablet 3  . isosorbide mononitrate (IMDUR) 30 MG 24 hr tablet Take 30 mg by mouth daily.      Marland Kitchen KLOR-CON M20 20 MEQ tablet TAKE 1 TABLET BY MOUTH TWICE A DAY 60 tablet 0  . metoprolol succinate (TOPROL-XL) 50 MG 24 hr tablet Take 50 mg by mouth daily. Take with or immediately following a meal.    . nitroGLYCERIN (NITROSTAT) 0.4 MG SL tablet Place 0.4 mg under the tongue every 5 (five) minutes as needed. Reported on 01/11/2016    . nystatin cream (MYCOSTATIN)     . pantoprazole (PROTONIX) 40 MG tablet Take 40 mg by mouth daily.  3  . pravastatin (PRAVACHOL) 40 MG tablet Take 40 mg by mouth at bedtime.    . ramipril (ALTACE) 10 MG capsule Take 10 mg by mouth 2 (two) times daily.     Marland Kitchen venlafaxine XR (EFFEXOR XR) 37.5 MG 24 hr capsule Take 1 capsule (37.5 mg total) by mouth daily with breakfast. 60 capsule 1   No current facility-administered medications for this visit.    PHYSICAL EXAMINATION: ECOG PERFORMANCE STATUS: {CHL ONC ECOG [phone]}  There were no vitals filed for this visit. There were no vitals filed for this visit. *** GENERAL:alert, no distress and comfortable SKIN: skin color, texture, turgor are normal, no rashes or significant lesions EYES: normal, Conjunctiva  are pink and non-injected, sclera clear {OROPHARYNX:no exudate, no erythema and lips, buccal mucosa, and tongue normal}  NECK: supple, thyroid normal size, non-tender, without nodularity LYMPH:  no palpable lymphadenopathy in the cervical, axillary {or inguinal} LUNGS: clear to auscultation and percussion with normal breathing effort HEART: regular rate & rhythm and no murmurs and no lower extremity edema ABDOMEN:abdomen soft, non-tender and normal bowel sounds Musculoskeletal:no cyanosis of digits and no clubbing  NEURO: alert & oriented x 3 with fluent speech, no focal motor/sensory deficits  LABORATORY DATA:  I have reviewed the data as listed CBC Latest Ref Rng & Units 02/27/2018 08/30/2017 03/01/2017  WBC 3.9 - 10.3 K/uL 6.2 5.6 8.4  Hemoglobin 11.6 - 15.9 g/dL 14.7 14.1 14.1  Hematocrit 34.8 - 46.6 % 43.8 42.1 42.6  Platelets 145 - 400 K/uL 251 259 313     CMP Latest Ref Rng & Units 02/27/2018 08/30/2017 03/01/2017  Glucose 70 - 140 mg/dL 101 108 95  BUN 7 - 26 mg/dL 7 9.6 12.6  Creatinine 0.60 - 1.10 mg/dL 0.89 0.8 0.8  Sodium 136 - 145 mmol/L 141 142 140  Potassium 3.5 - 5.1 mmol/L 3.2(L) 3.2(L) 3.8  Chloride 98 - 109 mmol/L 108 - -  CO2 22 - 29 mmol/L 25 20(L) 21(L)  Calcium 8.4 - 10.4 mg/dL 9.3 9.4 9.4  Total Protein 6.4 - 8.3 g/dL 6.9 6.9 7.2  Total Bilirubin 0.2 - 1.2 mg/dL 0.7 0.32 0.41  Alkaline Phos 40 - 150 U/L 79 86 86  AST 5 - 34 U/L 50(H) 27 33  ALT 0 - 55 U/L 49 22 43      RADIOGRAPHIC STUDIES: I have personally reviewed the radiological images as listed and agreed with the findings in the report. No results found.   ASSESSMENT & PLAN:  [name] is a 75 y.o. female with    1. Right breast multifocal invasive ductal carcinoma, pT1aN0M0, stage IA, ER+/PR+, and background DCIS  -She was diagnosed in 05/2014. She is s/p right mastectomy, left lumpectomy.  -She was started on antiestrogen therapy with Exemestane in 10/2014. Continue for 5 years.  -She  lost f/u after 02/2018 and ran out of Exemestane after 08/2018. She restarted in 05/2020. Plan to complete in 05/2021.  *** -She is clinically doing well. Lab reviewed, her CBC and CMP are within normal limits. Her physical exam and her 05/2020 mammogram were unremarkable. There is no clinical concern for recurrence.    2. HTN, CAD, asthma, GERD -She'll continue follow-up with her Cardiologist. She will establish care with a new PCP.   3. Bone health -We previously discussed that AI may weak her bone.  -Her 04/17/17 DEXA was normal with lowest T-score -0.8 at right femur neck. She is overdue, will schedule to be done in the next 6 months. *** -I previously encouraged her to take calcium and vitamin D.   Plan  ***   No problem-specific Assessment & Plan notes found for this encounter.   No orders of the defined types were placed in this encounter.  All questions were answered. The patient knows to call the clinic with any problems, questions or concerns. No barriers to learning was detected. The total time spent in the appointment was {CHL ONC TIME VISIT - XYDSW:[id number]}.     Joslyn Devon 12/20/2020   Oneal Deputy, am acting as scribe for Truitt Merle, MD.   {Add scribe attestation statement}

## 2020-12-21 ENCOUNTER — Inpatient Hospital Stay: Payer: Medicare PPO

## 2020-12-21 ENCOUNTER — Inpatient Hospital Stay: Payer: Medicare PPO | Admitting: Hematology

## 2020-12-21 ENCOUNTER — Telehealth: Payer: Self-pay | Admitting: Hematology

## 2020-12-21 NOTE — Telephone Encounter (Signed)
Scheduled appointments per 1/11 sch msg. Called patient, no answer. Left message with appointment date and time.

## 2021-01-05 NOTE — Progress Notes (Incomplete)
[place]   [phone] Fax:([phone]) [phone]   Clinic Follow up Note   Patient Care Team: Charolette Forward, MD as PCP - General (Cardiology) Charolette Forward, MD as Consulting Physician (Cardiology)  Date of Service:  01/05/2021  CHIEF COMPLAINT: Follow up right breast cancer  SUMMARY OF ONCOLOGIC HISTORY: Oncology History Overview Note  Breast cancer of upper-outer quadrant of right female breast   Staging form: Breast, AJCC 7th Edition     Pathologic stage from 09/01/2014: Stage IA (T1a(m), N0, cM0) - Unsigned     Breast cancer of upper-outer quadrant of right female breast ([place])  05/21/2014 Initial Biopsy   Right breast biopsy showed grade 2 DCIS with papillary features, ER/PR strongly positive.   05/21/2014 Receptors her2   ER 93%+, PR 100%   05/21/2014 Initial Diagnosis   Breast cancer of upper-outer quadrant of right female breast   09/01/2014 Surgery   Right mastectomy with sentinel lymph node biopsy, surgical margins were negative. Left lumpectomy showed fibrocystic changes.     PATHOLOGY REPORT Pathology: ACCESSION NUMBER:  [id number] RECEIVED: 09/01/2014 ORDERING PHYSICIAN:  Angelina Ok , MD PATIENT NAME:  [name], [name] SURGICAL PATHOLOGY REPORT  FINAL PATHOLOGIC DIAGNOSIS MICROSCOPIC EXAMINATION AND DIAGNOSIS  A.  SENTINEL LYMPH NODE, RIGHT AXILLA, #1, EXCISION:      One benign lymph node (0/1).  B.  SENTINEL LYMPH NODE, RIGHT AXILLA, #2, EXCISION:       One benign lymph node (0/1).  C.  BREAST, RIGHT, MASTECTOMY:      Invasive ductal carcinoma, two foci, 0.3 and 0.2 cm, arising in background of ductal carcinoma in situ, intermediate-high nuclear grade, papillary growth pattern with necrosis and calcifications.      Previous biopsy site changes.      Margins negative for malignancy.      Unremarkable skin.      See comment and cancer protocol.       D.  BREAST, LEFT, NEEDLE LOCALIZED LUMPECTOMY:      Fibrocystic  changes including stromal fibrosis, cysts formation, apocrine metaplasia, columnar cell alteration and usual ductal hyperplasia. Negative for atypia and carcinoma.      Previous biopsy site changes.   10/11/2014 -  Anti-estrogen oral therapy   Exemestane 25 mg once daily since 10/2014. Stopped 08/2018. Restarted 05/25/20 for 1 more year.   02/09/2016 Imaging   MM Screening breast tomo [facility] L IMPRESSION: No mammographic evidence of malignancy. A result letter of this screening mammogram will be mailed directly to the patient.   04/17/2017 Mammogram   negative   04/17/2017 Imaging   DEXA Scan measured at Femur Neck Right is 0.927 g/cm2 with a T-score of -0.8.      CURRENT THERAPY:  Exemestane 25 mg once daily since 10/2014. Stopped 08/2018. Restarted 05/25/20 for 1 more year.   INTERVAL HISTORY: *** [name] is here for a follow up of right breast cancer. She was last seen by me 7 months ago. She presents to the clinic alone.    REVIEW OF SYSTEMS:  *** Constitutional: Denies fevers, chills or abnormal weight loss Eyes: Denies blurriness of vision Ears, nose, mouth, throat, and face: Denies mucositis or sore throat Respiratory: Denies cough, dyspnea or wheezes Cardiovascular: Denies palpitation, chest discomfort or lower extremity swelling Gastrointestinal:  Denies nausea, heartburn or change in bowel habits Skin: Denies abnormal skin rashes Lymphatics: Denies new lymphadenopathy or easy bruising Neurological:Denies numbness, tingling or new weaknesses Behavioral/Psych: Mood is stable, no new changes  All other systems  were reviewed with the patient and are negative.  MEDICAL HISTORY:  Past Medical History:  Diagnosis Date  . Allergic rhinitis   . Anginal pain ([place])   . Arthritis   . Asthma   . Breast cancer ([place]) 2015   Right Breast Cancer  . Coronary artery disease   . Emphysema of lung ([place])   . GERD (gastroesophageal reflux disease)   . Hypertension   . Lupus  (systemic lupus erythematosus) ([place])   . Myocardial infarction [facility])     SURGICAL HISTORY: Past Surgical History:  Procedure Laterality Date  . [place]  . MASTECTOMY Right 2015    I have reviewed the social history and family history with the patient and they are unchanged from previous note.  ALLERGIES:  is allergic to lipitor [atorvastatin].  MEDICATIONS:  Current Outpatient Medications  Medication Sig Dispense Refill  . ALPRAZolam (XANAX) 0.25 MG tablet Take 0.25 mg by mouth 2 (two) times daily as needed.     Marland Kitchen aspirin EC 81 MG EC tablet Take 1 tablet (81 mg total) by mouth daily. 30 tablet 3  . exemestane (AROMASIN) 25 MG tablet Take 1 tablet (25 mg total) by mouth daily. 90 tablet 3  . isosorbide mononitrate (IMDUR) 30 MG 24 hr tablet Take 30 mg by mouth daily.      Marland Kitchen KLOR-CON M20 20 MEQ tablet TAKE 1 TABLET BY MOUTH TWICE A DAY 60 tablet 0  . metoprolol succinate (TOPROL-XL) 50 MG 24 hr tablet Take 50 mg by mouth daily. Take with or immediately following a meal.    . nitroGLYCERIN (NITROSTAT) 0.4 MG SL tablet Place 0.4 mg under the tongue every 5 (five) minutes as needed. Reported on 01/11/2016    . nystatin cream (MYCOSTATIN)     . pantoprazole (PROTONIX) 40 MG tablet Take 40 mg by mouth daily.  3  . pravastatin (PRAVACHOL) 40 MG tablet Take 40 mg by mouth at bedtime.    . ramipril (ALTACE) 10 MG capsule Take 10 mg by mouth 2 (two) times daily.     Marland Kitchen venlafaxine XR (EFFEXOR XR) 37.5 MG 24 hr capsule Take 1 capsule (37.5 mg total) by mouth daily with breakfast. 60 capsule 1   No current facility-administered medications for this visit.    PHYSICAL EXAMINATION: ECOG PERFORMANCE STATUS: {CHL ONC ECOG [phone]}  There were no vitals filed for this visit. There were no vitals filed for this visit. *** GENERAL:alert, no distress and comfortable SKIN: skin color, texture, turgor are normal, no rashes or significant lesions EYES: normal, Conjunctiva  are pink and non-injected, sclera clear {OROPHARYNX:no exudate, no erythema and lips, buccal mucosa, and tongue normal}  NECK: supple, thyroid normal size, non-tender, without nodularity LYMPH:  no palpable lymphadenopathy in the cervical, axillary {or inguinal} LUNGS: clear to auscultation and percussion with normal breathing effort HEART: regular rate & rhythm and no murmurs and no lower extremity edema ABDOMEN:abdomen soft, non-tender and normal bowel sounds Musculoskeletal:no cyanosis of digits and no clubbing  NEURO: alert & oriented x 3 with fluent speech, no focal motor/sensory deficits  LABORATORY DATA:  I have reviewed the data as listed CBC Latest Ref Rng & Units 02/27/2018 08/30/2017 03/01/2017  WBC 3.9 - 10.3 K/uL 6.2 5.6 8.4  Hemoglobin 11.6 - 15.9 g/dL 14.7 14.1 14.1  Hematocrit 34.8 - 46.6 % 43.8 42.1 42.6  Platelets 145 - 400 K/uL 251 259 313     CMP Latest Ref Rng & Units 02/27/2018 08/30/2017 03/01/2017  Glucose 70 - 140 mg/dL 101 108 95  BUN 7 - 26 mg/dL 7 9.6 12.6  Creatinine 0.60 - 1.10 mg/dL 0.89 0.8 0.8  Sodium 136 - 145 mmol/L 141 142 140  Potassium 3.5 - 5.1 mmol/L 3.2(L) 3.2(L) 3.8  Chloride 98 - 109 mmol/L 108 - -  CO2 22 - 29 mmol/L 25 20(L) 21(L)  Calcium 8.4 - 10.4 mg/dL 9.3 9.4 9.4  Total Protein 6.4 - 8.3 g/dL 6.9 6.9 7.2  Total Bilirubin 0.2 - 1.2 mg/dL 0.7 0.32 0.41  Alkaline Phos 40 - 150 U/L 79 86 86  AST 5 - 34 U/L 50(H) 27 33  ALT 0 - 55 U/L 49 22 43      RADIOGRAPHIC STUDIES: I have personally reviewed the radiological images as listed and agreed with the findings in the report. No results found.   ASSESSMENT & PLAN:  JAYELLE PAGE is a 75 y.o. female with   1. Right breast multifocal invasive ductal carcinoma, pT1aN0M0, stage IA, ER+/PR+, and background DCIS  -She was diagnosed in 05/2014. She is s/p right mastectomy, left lumpectomy.  -She was started on antiestrogen therapy with Exemestane in 10/2014. Given her early stage of  cancer, I think that 5 years of adjuvant therapy would be adequate. -She lost f/u after 02/2018 and ran out of Exemestane after 08/2018. She has not taken it since. I discussed she is 1 year short of her 5 year treatment. I discussed restarting Exemestane to complete 1 more year in 05/2021. She is agreeable.  -She is clinically doing well. Her physical exam and her 05/2019 mammogram were unremarkable. There is no clinical concern for recurrence. She will proceed with labs today.  -Continue Surveillance. Next mammogram on 05/27/20.  -Restart Exemestane.  -F/u in 6 months. I encouraged her to establish a new PCP. She is agreeable. I gave her contact and address for [place] and [facility].    2. Iron deficient anemia -She had a study at [facility] in February 2016, which showed iron 19, ferritin 8, TIBC 460, and saturation 4%. This is consistent with iron deficient anemia. -Her anemia has resolved. I encouraged her to continue oral iron  -she recently had EGD and COLONOSCOPY at [facility], which showed mild gastritis and benign polyps  - Anemia has resolved now   3. Pulmonary nodules -She was found to have a 3 mm noncalcified nodule in the right lung base. Repeat CT scan on 06/08/2015 showed no change, and additional smaller calcified and noncalcified nodules are all stable. -She has long-standing smoking history, but quit in May 2015.  4. HTN, CAD, asthma, GERD -She'll continue follow-up with her Cardiologist. She will establish care with a new PCP.   5. Bone health -We previously discussed that AI may weak her bone.  -Her 04/17/17 DEXA was normal with lowest T-score -0.8 at right femur neck. She is overdue, will schedule to be done in the next 6 months.  -I previously encouraged her to take calcium and vitamin D.   Plan  -Labs today  -Restart exemestane,refilledtoday  -Mammogram on 6/17 scheduled  -DEXA at [facility] to be done in the next 6 months.  -Lab and F/u  in 6 months    No problem-specific Assessment & Plan notes found for this encounter.   No orders of the defined types were placed in this encounter.  All questions were answered. The patient knows to call the clinic with any problems, questions or concerns. No barriers to  learning was detected. The total time spent in the appointment was {CHL ONC TIME VISIT - IOPPU:[id number]}.     Joslyn Devon 01/05/2021   Oneal Deputy, am acting as scribe for Truitt Merle, MD.   {Add scribe attestation statement}

## 2021-01-07 ENCOUNTER — Inpatient Hospital Stay: Payer: Medicare PPO | Admitting: Hematology

## 2021-01-07 ENCOUNTER — Inpatient Hospital Stay: Payer: Medicare PPO | Attending: Nurse Practitioner

## 2021-04-19 ENCOUNTER — Other Ambulatory Visit: Payer: Self-pay | Admitting: Hematology

## 2021-04-19 DIAGNOSIS — Z1231 Encounter for screening mammogram for malignant neoplasm of breast: Secondary | ICD-10-CM

## 2021-05-17 ENCOUNTER — Emergency Department (HOSPITAL_COMMUNITY): Payer: Medicare PPO

## 2021-05-17 ENCOUNTER — Encounter (HOSPITAL_COMMUNITY): Payer: Self-pay

## 2021-05-17 ENCOUNTER — Inpatient Hospital Stay (HOSPITAL_COMMUNITY)
Admission: EM | Admit: 2021-05-17 | Discharge: 2021-05-24 | DRG: 683 | Disposition: A | Discharge status: Home Health | Payer: Medicare PPO | Attending: Family Medicine | Admitting: Family Medicine

## 2021-05-17 ENCOUNTER — Other Ambulatory Visit: Payer: Self-pay

## 2021-05-17 DIAGNOSIS — F32A Depression, unspecified: Secondary | ICD-10-CM | POA: Diagnosis present

## 2021-05-17 DIAGNOSIS — Z7982 Long term (current) use of aspirin: Secondary | ICD-10-CM | POA: Diagnosis not present

## 2021-05-17 DIAGNOSIS — F17210 Nicotine dependence, cigarettes, uncomplicated: Secondary | ICD-10-CM | POA: Diagnosis present

## 2021-05-17 DIAGNOSIS — Y92009 Unspecified place in unspecified non-institutional (private) residence as the place of occurrence of the external cause: Secondary | ICD-10-CM | POA: Diagnosis not present

## 2021-05-17 DIAGNOSIS — Z79899 Other long term (current) drug therapy: Secondary | ICD-10-CM | POA: Diagnosis not present

## 2021-05-17 DIAGNOSIS — I252 Old myocardial infarction: Secondary | ICD-10-CM

## 2021-05-17 DIAGNOSIS — I10 Essential (primary) hypertension: Secondary | ICD-10-CM | POA: Diagnosis present

## 2021-05-17 DIAGNOSIS — Z801 Family history of malignant neoplasm of trachea, bronchus and lung: Secondary | ICD-10-CM | POA: Diagnosis not present

## 2021-05-17 DIAGNOSIS — N938 Other specified abnormal uterine and vaginal bleeding: Secondary | ICD-10-CM | POA: Diagnosis not present

## 2021-05-17 DIAGNOSIS — E871 Hypo-osmolality and hyponatremia: Secondary | ICD-10-CM | POA: Diagnosis present

## 2021-05-17 DIAGNOSIS — N179 Acute kidney failure, unspecified: Principal | ICD-10-CM | POA: Diagnosis present

## 2021-05-17 DIAGNOSIS — R6510 Systemic inflammatory response syndrome (SIRS) of non-infectious origin without acute organ dysfunction: Secondary | ICD-10-CM | POA: Diagnosis present

## 2021-05-17 DIAGNOSIS — T796XXA Traumatic ischemia of muscle, initial encounter: Secondary | ICD-10-CM | POA: Diagnosis present

## 2021-05-17 DIAGNOSIS — Z7952 Long term (current) use of systemic steroids: Secondary | ICD-10-CM | POA: Diagnosis not present

## 2021-05-17 DIAGNOSIS — D72829 Elevated white blood cell count, unspecified: Secondary | ICD-10-CM

## 2021-05-17 DIAGNOSIS — E872 Acidosis: Secondary | ICD-10-CM | POA: Diagnosis present

## 2021-05-17 DIAGNOSIS — Z833 Family history of diabetes mellitus: Secondary | ICD-10-CM | POA: Diagnosis not present

## 2021-05-17 DIAGNOSIS — J45909 Unspecified asthma, uncomplicated: Secondary | ICD-10-CM | POA: Diagnosis present

## 2021-05-17 DIAGNOSIS — Z955 Presence of coronary angioplasty implant and graft: Secondary | ICD-10-CM

## 2021-05-17 DIAGNOSIS — N939 Abnormal uterine and vaginal bleeding, unspecified: Secondary | ICD-10-CM | POA: Diagnosis not present

## 2021-05-17 DIAGNOSIS — Z800 Family history of malignant neoplasm of digestive organs: Secondary | ICD-10-CM

## 2021-05-17 DIAGNOSIS — M329 Systemic lupus erythematosus, unspecified: Secondary | ICD-10-CM | POA: Diagnosis present

## 2021-05-17 DIAGNOSIS — D72828 Other elevated white blood cell count: Secondary | ICD-10-CM | POA: Diagnosis present

## 2021-05-17 DIAGNOSIS — J439 Emphysema, unspecified: Secondary | ICD-10-CM | POA: Diagnosis present

## 2021-05-17 DIAGNOSIS — Z20822 Contact with and (suspected) exposure to covid-19: Secondary | ICD-10-CM | POA: Diagnosis present

## 2021-05-17 DIAGNOSIS — E8809 Other disorders of plasma-protein metabolism, not elsewhere classified: Secondary | ICD-10-CM | POA: Diagnosis present

## 2021-05-17 DIAGNOSIS — I2510 Atherosclerotic heart disease of native coronary artery without angina pectoris: Secondary | ICD-10-CM | POA: Diagnosis present

## 2021-05-17 DIAGNOSIS — E860 Dehydration: Secondary | ICD-10-CM | POA: Diagnosis present

## 2021-05-17 DIAGNOSIS — Z853 Personal history of malignant neoplasm of breast: Secondary | ICD-10-CM

## 2021-05-17 DIAGNOSIS — E876 Hypokalemia: Secondary | ICD-10-CM | POA: Diagnosis not present

## 2021-05-17 DIAGNOSIS — Z8049 Family history of malignant neoplasm of other genital organs: Secondary | ICD-10-CM

## 2021-05-17 DIAGNOSIS — W19XXXA Unspecified fall, initial encounter: Secondary | ICD-10-CM | POA: Diagnosis present

## 2021-05-17 DIAGNOSIS — E861 Hypovolemia: Secondary | ICD-10-CM | POA: Diagnosis present

## 2021-05-17 DIAGNOSIS — K219 Gastro-esophageal reflux disease without esophagitis: Secondary | ICD-10-CM | POA: Diagnosis present

## 2021-05-17 DIAGNOSIS — Z9011 Acquired absence of right breast and nipple: Secondary | ICD-10-CM

## 2021-05-17 DIAGNOSIS — F419 Anxiety disorder, unspecified: Secondary | ICD-10-CM | POA: Diagnosis present

## 2021-05-17 DIAGNOSIS — E785 Hyperlipidemia, unspecified: Secondary | ICD-10-CM | POA: Diagnosis present

## 2021-05-17 DIAGNOSIS — M6282 Rhabdomyolysis: Secondary | ICD-10-CM

## 2021-05-17 LAB — CBC WITH DIFFERENTIAL/PLATELET
Abs Immature Granulocytes: 0.18 10*3/uL — ABNORMAL HIGH (ref 0.00–0.07)
Basophils Absolute: 0.1 10*3/uL (ref 0.0–0.1)
Basophils Relative: 0 %
Eosinophils Absolute: 0 10*3/uL (ref 0.0–0.5)
Eosinophils Relative: 0 %
HCT: 41.5 % (ref 36.0–46.0)
Hemoglobin: 14.3 g/dL (ref 12.0–15.0)
Immature Granulocytes: 1 %
Lymphocytes Relative: 7 %
Lymphs Abs: 1.1 10*3/uL (ref 0.7–4.0)
MCH: 31.7 pg (ref 26.0–34.0)
MCHC: 34.5 g/dL (ref 30.0–36.0)
MCV: 92 fL (ref 80.0–100.0)
Monocytes Absolute: 0.5 10*3/uL (ref 0.1–1.0)
Monocytes Relative: 3 %
Neutro Abs: 13.9 10*3/uL — ABNORMAL HIGH (ref 1.7–7.7)
Neutrophils Relative %: 89 %
Platelets: 265 10*3/uL (ref 150–400)
RBC: 4.51 MIL/uL (ref 3.87–5.11)
RDW: 13.1 % (ref 11.5–15.5)
WBC: 15.7 10*3/uL — ABNORMAL HIGH (ref 4.0–10.5)
nRBC: 0 % (ref 0.0–0.2)

## 2021-05-17 LAB — URINALYSIS, ROUTINE W REFLEX MICROSCOPIC
Bacteria, UA: NONE SEEN
Bilirubin Urine: NEGATIVE
Glucose, UA: NEGATIVE mg/dL
Ketones, ur: NEGATIVE mg/dL
Leukocytes,Ua: NEGATIVE
Nitrite: NEGATIVE
Protein, ur: 100 mg/dL — AB
Specific Gravity, Urine: 1.021 (ref 1.005–1.030)
pH: 5 (ref 5.0–8.0)

## 2021-05-17 LAB — COMPREHENSIVE METABOLIC PANEL
ALT: 146 U/L — ABNORMAL HIGH (ref 0–44)
AST: 381 U/L — ABNORMAL HIGH (ref 15–41)
Albumin: 2.6 g/dL — ABNORMAL LOW (ref 3.5–5.0)
Alkaline Phosphatase: 199 U/L — ABNORMAL HIGH (ref 38–126)
Anion gap: 20 — ABNORMAL HIGH (ref 5–15)
BUN: 41 mg/dL — ABNORMAL HIGH (ref 8–23)
CO2: 20 mmol/L — ABNORMAL LOW (ref 22–32)
Calcium: 8.5 mg/dL — ABNORMAL LOW (ref 8.9–10.3)
Chloride: 84 mmol/L — ABNORMAL LOW (ref 98–111)
Creatinine, Ser: 2.69 mg/dL — ABNORMAL HIGH (ref 0.44–1.00)
GFR, Estimated: 18 mL/min — ABNORMAL LOW (ref >60)
Glucose, Bld: 76 mg/dL (ref 70–99)
Potassium: 3.2 mmol/L — ABNORMAL LOW (ref 3.5–5.1)
Sodium: 124 mmol/L — ABNORMAL LOW (ref 135–145)
Total Bilirubin: 1.8 mg/dL — ABNORMAL HIGH (ref 0.3–1.2)
Total Protein: 6.7 g/dL (ref 6.5–8.1)

## 2021-05-17 LAB — PROTIME-INR
INR: 1.6 — ABNORMAL HIGH (ref 0.8–1.2)
Prothrombin Time: 19.2 seconds — ABNORMAL HIGH (ref 11.4–15.2)

## 2021-05-17 LAB — ETHANOL: Alcohol, Ethyl (B): <10 mg/dL (ref <10)

## 2021-05-17 LAB — RESP PANEL BY RT-PCR (FLU A&B, COVID) ARPGX2
Influenza A by PCR: NEGATIVE
Influenza B by PCR: NEGATIVE
SARS Coronavirus 2 by RT PCR: NEGATIVE

## 2021-05-17 LAB — RAPID URINE DRUG SCREEN, HOSP PERFORMED
Amphetamines: NOT DETECTED
Barbiturates: NOT DETECTED
Benzodiazepines: POSITIVE — AB
Cocaine: NOT DETECTED
Opiates: NOT DETECTED
Tetrahydrocannabinol: NOT DETECTED

## 2021-05-17 LAB — LACTIC ACID, PLASMA
Lactic Acid, Venous: 2 mmol/L (ref 0.5–1.9)
Lactic Acid, Venous: 2.2 mmol/L (ref 0.5–1.9)

## 2021-05-17 LAB — CK: Total CK: 5703 U/L — ABNORMAL HIGH (ref 38–234)

## 2021-05-17 LAB — AMMONIA: Ammonia: 26 umol/L (ref 9–35)

## 2021-05-17 MED ORDER — ACETAMINOPHEN 325 MG PO TABS
650.0000 mg | ORAL_TABLET | Freq: Two times a day (BID) | ORAL | Status: DC | PRN
Start: 2021-05-17 — End: 2021-05-24
  Administered 2021-05-18: 650 mg via ORAL
  Filled 2021-05-17: qty 2

## 2021-05-17 MED ORDER — ASPIRIN EC 81 MG PO TBEC
81.0000 mg | DELAYED_RELEASE_TABLET | Freq: Every day | ORAL | Status: DC
  Administered 2021-05-18 – 2021-05-24 (×7): 81 mg via ORAL
  Filled 2021-05-17 (×8): qty 1

## 2021-05-17 MED ORDER — PROCHLORPERAZINE EDISYLATE 10 MG/2ML IJ SOLN: 5.0000 mg | Freq: Four times a day (QID) | INTRAMUSCULAR | Status: DC | PRN

## 2021-05-17 MED ORDER — LACTATED RINGERS IV BOLUS (SEPSIS)
1000.0000 mL | Freq: Once | INTRAVENOUS | Status: AC
  Administered 2021-05-17: 1000 mL via INTRAVENOUS

## 2021-05-17 MED ORDER — VANCOMYCIN VARIABLE DOSE PER UNSTABLE RENAL FUNCTION (PHARMACIST DOSING): Status: DC

## 2021-05-17 MED ORDER — PANTOPRAZOLE SODIUM 40 MG PO TBEC
40.0000 mg | DELAYED_RELEASE_TABLET | Freq: Every day | ORAL | Status: DC
  Administered 2021-05-18 – 2021-05-24 (×7): 40 mg via ORAL
  Filled 2021-05-17 (×8): qty 1

## 2021-05-17 MED ORDER — SODIUM CHLORIDE 0.9 % IV SOLN
2.0000 g | Freq: Once | INTRAVENOUS | Status: AC
  Administered 2021-05-17: 2 g via INTRAVENOUS
  Filled 2021-05-17: qty 2

## 2021-05-17 MED ORDER — VANCOMYCIN HCL 1500 MG/300ML IV SOLN
1500.0000 mg | Freq: Once | INTRAVENOUS | Status: AC
  Administered 2021-05-17: 1500 mg via INTRAVENOUS
  Filled 2021-05-17: qty 300

## 2021-05-17 MED ORDER — SODIUM CHLORIDE 0.9 % IV SOLN
2.0000 g | INTRAVENOUS | Status: DC
  Administered 2021-05-18: 2 g via INTRAVENOUS
  Filled 2021-05-17: qty 2

## 2021-05-17 MED ORDER — MELATONIN 3 MG PO TABS
3.0000 mg | ORAL_TABLET | Freq: Every evening | ORAL | Status: DC | PRN
  Administered 2021-05-18 – 2021-05-24 (×2): 3 mg via ORAL
  Filled 2021-05-17 (×2): qty 1

## 2021-05-17 MED ORDER — POTASSIUM CHLORIDE IN NACL 20-0.9 MEQ/L-% IV SOLN
INTRAVENOUS | Status: DC
  Filled 2021-05-17 (×4): qty 1000

## 2021-05-17 MED ORDER — HEPARIN SODIUM (PORCINE) 5000 UNIT/ML IJ SOLN
5000.0000 [IU] | Freq: Three times a day (TID) | INTRAMUSCULAR | Status: DC
  Administered 2021-05-18 – 2021-05-21 (×11): 5000 [IU] via SUBCUTANEOUS
  Filled 2021-05-17 (×11): qty 1

## 2021-05-17 MED ORDER — METRONIDAZOLE 500 MG/100ML IV SOLN
500.0000 mg | Freq: Once | INTRAVENOUS | Status: AC
  Administered 2021-05-17: 500 mg via INTRAVENOUS
  Filled 2021-05-17: qty 100

## 2021-05-17 MED ORDER — ALPRAZOLAM 0.25 MG PO TABS
0.2500 mg | ORAL_TABLET | Freq: Every evening | ORAL | Status: DC | PRN
  Administered 2021-05-18 – 2021-05-23 (×4): 0.25 mg via ORAL
  Filled 2021-05-17 (×4): qty 1

## 2021-05-17 MED ORDER — ACETAMINOPHEN 325 MG PO TABS
650.0000 mg | ORAL_TABLET | Freq: Once | ORAL | Status: AC
  Administered 2021-05-17: 650 mg via ORAL
  Filled 2021-05-17: qty 2

## 2021-05-17 MED ORDER — POTASSIUM CHLORIDE CRYS ER 20 MEQ PO TBCR
20.0000 meq | EXTENDED_RELEASE_TABLET | Freq: Two times a day (BID) | ORAL | Status: DC
  Administered 2021-05-18 – 2021-05-19 (×3): 20 meq via ORAL
  Filled 2021-05-17 (×4): qty 1

## 2021-05-17 NOTE — ED Triage Notes (Signed)
BIB EMS for generalized weakness. Pt had a fall yesterday and was evaluated by EMS, pt did not want to come to the hospital. Family checked on her today and pt seemed more weak and with BUE tenderness. Pt was in lying in bed. Pt lives alone and ambulatory at baseline.

## 2021-05-17 NOTE — ED Notes (Addendum)
Patient transported to CT, XR. Unable to start medications and fluid at this time.

## 2021-05-17 NOTE — Progress Notes (Signed)
Pharmacy Antibiotic Note  [name] is a 75 y.o. female admitted on 05/17/2021 with infection of unknown source.  Pharmacy has been consulted for cefepime and vancomycin dosing.  WBC 15.7. Tm 101.60F, SCr 2.69  Plan: -Start Cefepime 2 gm IV Q 24 hours -Vancomycin 1500 mg IV load followed by dosing per random levels  -Monitor CBC, renal fx, cultures and clinical progress  Height: 5\' 6"  (167.6 cm) Weight: 78 kg (172 lb) IBW/kg (Calculated) : 59.3  Temp (24hrs), Avg:99.9 F (37.7 C), Min:98.3 F (36.8 C), Max:101.5 F (38.6 C)  Recent Labs  Lab 05/17/21 1802  WBC 15.7*    CrCl cannot be calculated (Patient's most recent lab result is older than the maximum 21 days allowed.).    Allergies  Allergen Reactions  . Lipitor [Atorvastatin] Other (See Comments)    Pt states med makes her feel "loopy"    Antimicrobials this admission: Cefepime 6/7 >>  Vancomycin 6/7 >>   Dose adjustments this admission:  Microbiology results: 6/7 BCx:  6/7 UCx:     Thank you for allowing pharmacy to be a part of this patient's care.  Albertina Parr, PharmD., BCPS, BCCCP Clinical Pharmacist Please refer to [facility] for unit-specific pharmacist

## 2021-05-17 NOTE — H&P (Signed)
History and Physical  [name] DOB: 11-Apr-1946 DOA: 05/17/2021  Referring physician: Dr. Erenest Blank, [place]. PCP: Charolette Forward, MD  Outpatient Specialists: Hematology/oncology. Patient coming from: Home, she lives alone.  Chief Complaint: Generalized weakness, fell the day prior to presentation, on the floor for hours.  HPI: [name] is a 75 y.o. female with medical history significant for right breast cancer, iron deficiency anemia, pulmonary nodules, essential hypertension, coronary artery disease, asthma, GERD who presented to [facility] ED due to worsening generalized weakness of 1 month duration associated with a fall the day prior to presentation at home.  She was on the floor for hours prior to receiving help.  She lives alone, she does not recall exactly how she fell.  She denies any loss of consciousness.  Admits to hitting her head.  Reports pain in her left hip and left knee which makes it difficult to ambulate.  She denies any chest pain or dyspnea or GI symptoms.  While in the ED she was febrile with T-max of 101.5, rectally.  She was placed on sepsis protocol.  Blood cultures were obtained and she was started on IV antibiotics empirically and on IV fluid.  No clear source of infection with negative UA and negative chest x-ray.  Ultrasound of right upper quadrant was also unremarkable.  Trauma work-up was negative.  EDP discussed case with the patient's primary provider Dr. Terrence Dupont who recommended [facility], hospitalist team, admission.  ED Course:  T-max 101.5.  BP 122/62, pulse 73, respiratory rate 25, O2 saturation 92% on room air.  Lab studies remarkable for serum sodium 124, serum potassium 3.2, serum bicarb 20, BUN 41, creatinine 2.69, anion gap 20, GFR 18.  Alkaline phosphatase 199, AST 381, ALT 146, total bilirubin 1.8, ammonia level normal.  CPK greater than 5700.  Lactic acid 2.0, 2.2.  WBC 15.7, hemoglobin 14.3, platelet 265, neutrophil count 13.9.  Review of  Systems: Review of systems as noted in the HPI. All other systems reviewed and are negative.   Past Medical History:  Diagnosis Date  . Allergic rhinitis   . Anginal pain ([place])   . Arthritis   . Asthma   . Breast cancer ([place]) 2015   Right Breast Cancer  . Coronary artery disease   . Emphysema of lung ([place])   . GERD (gastroesophageal reflux disease)   . Hypertension   . Lupus (systemic lupus erythematosus) ([place])   . Myocardial infarction [facility])    Past Surgical History:  Procedure Laterality Date  . [place]  . MASTECTOMY Right 2015    Social History:  reports that she has been smoking cigarettes. She has never used smokeless tobacco. She reports current alcohol use. She reports that she does not use drugs.   Allergies  Allergen Reactions  . Lipitor [Atorvastatin] Other (See Comments)    Pt states med makes her feel "loopy"    Family History  Problem Relation Age of Onset  . Allergies Mother   . Heart disease Mother   . Diabetes Mother   . Hypertension Mother   . Cancer Father        Lung Cancer  . Cancer Sister 28       lung cancer   . Cancer Maternal Aunt 78       colon cancer   . Cancer Maternal Aunt 90       uterine cancer       Prior to Admission medications   Medication Sig  Start Date End Date Taking? Authorizing Provider  ALPRAZolam (XANAX) 0.25 MG tablet Take 0.25 mg by mouth 2 (two) times daily as needed.   Yes [provider]  amLODipine (NORVASC) 5 MG tablet Take 5 mg by mouth daily. 05/01/21  Yes [provider]  aspirin EC 81 MG EC tablet Take 1 tablet (81 mg total) by mouth daily. 03/14/13  Yes Charolette Forward, MD  exemestane (AROMASIN) 25 MG tablet Take 1 tablet (25 mg total) by mouth daily. 05/25/20  Yes Truitt Merle, MD  isosorbide mononitrate (IMDUR) 30 MG 24 hr tablet Take 30 mg by mouth daily.   Yes [provider]  KLOR-CON M20 20 MEQ tablet TAKE 1 TABLET BY MOUTH TWICE A DAY Patient taking  differently: Take 20 mEq by mouth 2 (two) times daily. 04/05/18  Yes Truitt Merle, MD  metoprolol succinate (TOPROL-XL) 50 MG 24 hr tablet Take 50 mg by mouth daily. Take with or immediately following a meal.   Yes [provider]  nitroGLYCERIN (NITROSTAT) 0.4 MG SL tablet Place 0.4 mg under the tongue every 5 (five) minutes as needed for chest pain. Reported on 01/11/2016   Yes [provider]  pantoprazole (PROTONIX) 40 MG tablet Take 40 mg by mouth daily. 06/01/15  Yes [provider]  pravastatin (PRAVACHOL) 40 MG tablet Take 40 mg by mouth at bedtime.   Yes [provider]  predniSONE (DELTASONE) 5 MG tablet Take 5 mg by mouth daily. 05/02/21  Yes [provider]  ramipril (ALTACE) 10 MG capsule Take 10 mg by mouth 2 (two) times daily.   Yes [provider]  venlafaxine XR (EFFEXOR XR) 37.5 MG 24 hr capsule Take 1 capsule (37.5 mg total) by mouth daily with breakfast. 06/08/16  Yes Truitt Merle, MD  ofloxacin (OCUFLOX) 0.3 % ophthalmic solution Place 1 drop into the right eye 4 (four) times daily. Patient not taking: No sig reported 03/10/21   [provider]  prednisoLONE acetate (PRED FORTE) 1 % ophthalmic suspension Place 1 drop into the right eye 4 (four) times daily. Patient not taking: No sig reported 03/10/21   [provider]  PROLENSA 0.07 % SOLN Place 1 drop into the right eye daily. Patient not taking: No sig reported 03/10/21   [provider]    Physical Exam: BP 122/62   Pulse 73   Temp 98.4 F (36.9 C) (Oral)   Resp (!) 25   Ht 5\' 6"  (1.676 m)   Wt 78 kg   SpO2 92%   BMI 27.76 kg/m   . General: 75 y.o. year-old female well developed well nourished in no acute distress.  Alert and oriented x3. . Cardiovascular: Regular rate and rhythm with no rubs or gallops.  No thyromegaly or JVD noted.  No lower extremity edema.  Marland Kitchen Respiratory: Clear to auscultation with no wheezes or rales. Good inspiratory  effort. . Abdomen: Soft nontender nondistended with normal bowel sounds x4 quadrants. . Muskuloskeletal: No cyanosis, clubbing or edema noted bilaterally . Neuro: CN II-XII intact, strength, sensation, reflexes . Skin: No ulcerative lesions noted or rashes . Psychiatry: Judgement and insight appear normal. Mood is appropriate for condition and setting          Labs on Admission:  Basic Metabolic Panel: Recent Labs  Lab 05/17/21 1802  NA 124*  K 3.2*  CL 84*  CO2 20*  GLUCOSE 76  BUN 41*  CREATININE 2.69*  CALCIUM 8.5*   Liver Function Tests: Recent Labs  Lab 05/17/21 1802  AST 381*  ALT 146*  ALKPHOS 199*  BILITOT 1.8*  PROT 6.7  ALBUMIN 2.6*   No results for input(s): LIPASE, AMYLASE in the last 168 hours. Recent Labs  Lab 05/17/21 1802  AMMONIA 26   CBC: Recent Labs  Lab 05/17/21 1802  WBC 15.7*  NEUTROABS 13.9*  HGB 14.3  HCT 41.5  MCV 92.0  PLT 265   Cardiac Enzymes: Recent Labs  Lab 05/17/21 1802  CKTOTAL 5,703*    BNP (last 3 results) No results for input(s): BNP in the last 8760 hours.  ProBNP (last 3 results) No results for input(s): PROBNP in the last 8760 hours.  CBG: No results for input(s): GLUCAP in the last 168 hours.  Radiological Exams on Admission: DG Chest 1 View  Result Date: 05/17/2021 CLINICAL DATA:  Fall yesterday, weakness, low back pain EXAM: CHEST  1 VIEW COMPARISON:  03/13/2013 chest radiograph. FINDINGS: Stable cardiomediastinal silhouette with normal heart size. No pneumothorax. No pleural effusion. Mild left basilar atelectasis. Otherwise clear lungs with no pulmonary edema. No displaced fractures in the visualized chest. IMPRESSION: Mild left basilar atelectasis. Electronically Signed   By: Ilona Sorrel M.D.   On: 05/17/2021 19:26   DG Lumbar Spine Complete  Result Date: 05/17/2021 CLINICAL DATA:  Fall yesterday, low back pain EXAM: LUMBAR SPINE - COMPLETE 4+ VIEW COMPARISON:  None. FINDINGS: This report assumes 5  non rib-bearing lumbar vertebrae. Lumbar vertebral body heights are preserved, with no fracture. Mild multilevel lumbar degenerative disc disease, most prominent at L3-4. Mild 4 mm anterolisthesis at L4-5. Mild bilateral lower lumbar facet arthropathy. No aggressive appearing focal osseous lesions. Coarse calcifications in the deep pelvis, potentially small calcified uterine fibroids. IMPRESSION: 1. No lumbar spine fracture. 2. Mild multilevel lumbar degenerative disc disease, most prominent at L3-4. 3. Mild 4 mm anterolisthesis at L4-5. Electronically Signed   By: Ilona Sorrel M.D.   On: 05/17/2021 19:29   DG Knee 1-2 Views Left  Result Date: 05/17/2021 CLINICAL DATA:  Left knee pain following a fall. EXAM: LEFT KNEE - 1-2 VIEW COMPARISON:  None. FINDINGS: In moderate anterior patellar enthesophyte fat at the site of quadriceps tendon insertion. No fracture, dislocation or effusion seen. IMPRESSION: No fracture or effusion. Electronically Signed   By: Claudie Revering M.D.   On: 05/17/2021 19:29   DG Knee 1-2 Views Right  Result Date: 05/17/2021 CLINICAL DATA:  Fall yesterday, right knee pain EXAM: RIGHT KNEE - 1-2 VIEW COMPARISON:  None. FINDINGS: No fracture, joint effusion or dislocation. Small superior right patellar enthesophyte. No focal osseous lesions. No radiopaque foreign bodies. No significant degenerative arthropathy. IMPRESSION: No right knee fracture, joint effusion or malalignment. Small superior right patellar enthesophyte. Electronically Signed   By: Ilona Sorrel M.D.   On: 05/17/2021 19:30   CT Head Wo Contrast  Result Date: 05/17/2021 CLINICAL DATA:  Status post fall. EXAM: CT HEAD WITHOUT CONTRAST TECHNIQUE: Contiguous axial images were obtained from the base of the skull through the vertex without intravenous contrast. COMPARISON:  Apr 20, 2008 FINDINGS: Brain: There is mild cerebral atrophy with widening of the extra-axial spaces and ventricular dilatation. There are areas of decreased  attenuation within the white matter tracts of the supratentorial brain, consistent with microvascular disease changes. Small chronic left basal ganglia lacunar infarcts are seen. Vascular: No hyperdense vessel or unexpected calcification. Skull: Normal. Negative for fracture or focal lesion. Sinuses/Orbits: No acute finding. Other: Very mild left frontal scalp soft tissue swelling is  noted. IMPRESSION: 1. Generalized cerebral atrophy. 2. No acute intracranial abnormality. Electronically Signed   By: Virgina Norfolk M.D.   On: 05/17/2021 19:41   CT Cervical Spine Wo Contrast  Result Date: 05/17/2021 CLINICAL DATA:  Generalized weakness.  Fall yesterday. EXAM: CT CERVICAL SPINE WITHOUT CONTRAST TECHNIQUE: Multidetector CT imaging of the cervical spine was performed without intravenous contrast. Multiplanar CT image reconstructions were also generated. COMPARISON:  None. FINDINGS: Alignment: Patient is tilted. No traumatic subluxation. Mild straightening of normal lordosis. Skull base and vertebrae: No acute fracture. Vertebral body heights are maintained. The dens and skull base are intact. Soft tissues and spinal canal: No prevertebral fluid or swelling. No visible canal hematoma. Disc levels: Degenerative disc disease at multiple levels most prominently affecting C5-C6 and C6-C7. Scattered facet hypertrophy. No high-grade bony canal stenosis. Upper chest: No acute findings.  Emphysema. Other: None. IMPRESSION: Degenerative change in the cervical spine without acute fracture or subluxation. Electronically Signed   By: Keith Rake M.D.   On: 05/17/2021 19:42   US Abdomen Limited  Result Date: 05/17/2021 CLINICAL DATA:  Right upper quadrant abdominal pain, elevated liver function tests EXAM: ULTRASOUND ABDOMEN LIMITED RIGHT UPPER QUADRANT COMPARISON:  None. FINDINGS: Gallbladder: No gallstones or wall thickening visualized. No sonographic Murphy sign noted by sonographer. Common bile duct: Diameter: 6-7 mm  in proximal diameter Liver: No focal lesion identified. Within normal limits in parenchymal echogenicity. Portal vein is patent on color Doppler imaging with normal direction of blood flow towards the liver. Other: None. IMPRESSION: Normal right upper quadrant sonogram Electronically Signed   By: Fidela Salisbury MD   On: 05/17/2021 21:32   DG Hip Unilat W or Wo Pelvis 2-3 Views Left  Result Date: 05/17/2021 CLINICAL DATA:  Fall.  Weakness. EXAM: DG HIP (WITH OR WITHOUT PELVIS) 2-3V LEFT COMPARISON:  None. FINDINGS: There is no evidence of hip fracture or dislocation. Mild bilateral hip osteoarthritis. Several calcified fibroids identified within the pelvis. IMPRESSION: 1. No acute findings. 2. Mild bilateral hip osteoarthritis. Electronically Signed   By: Kerby Moors M.D.   On: 05/17/2021 19:27    EKG: I independently viewed the EKG done and my findings are as followed: Sinus rhythm 92.  Nonspecific ST-T changes.  QTc 541.  Assessment/Plan Present on Admission: . AKI (acute kidney injury) ([place])  Active Problems:   AKI (acute kidney injury) ([place])  AKI, prerenal in the setting of dehydration from poor oral intake. Baseline creatinine appears to be 0.8 with GFR greater than 60 Presented with creatinine of 2.69 with GFR of 18. Start gentle IV fluid hydration Avoid nephrotoxic agents, dehydration and hypotension. Monitor urine output Repeat renal panel in the morning. Hold off home ramipril  Hypovolemic hyponatremia, suspect secondary to poor oral intake and possible side effect from home SNRI, Effexor. Presented with serum sodium of 124 Hold off home Effexor for now Obtain serum osmolality and urine lites Start normal saline at 75 cc/h Check serum sodium every 3 hours until serum sodium reaches 130 Avoid rapid correction of serum sodium no more than 8 mEq correction in 24 hours.  Rhabdomyolysis post fall Patient was on the floor for hours Presented with CPK greater than 5700. IV  fluid hydration Daily CPK levels.  Prolonged QTC QTC on presentation 541 Repeat twelve-lead EKG in the morning Avoid QTC prolonging agents Optimize magnesium and potassium levels. Goal magnesium greater than 2.0, goal potassium greater than 4.0.  SIRS criteria, unclear etiology. Fever with T-max around 1.5, leukocytosis 15.7 No  clear source of active infective process Follow cultures  Hypokalemia Presented with serum potassium of 3.2 Goal potassium greater than 4.0 Repleted orally and intravenously. Obtain magnesium level Repeat BMP in the morning.  Mild anion gap metabolic acidosis in the setting of AKI. Presented with serum bicarb 20 and anion gap of 20. Continue IV fluid hydration.  Acute transaminitis, unclear etiology Right upper quadrant abdominal ultrasound benign Repeat CMP in the morning Obtain acute viral hepatitis panel. Avoid hepatotoxic agents  Generalized weakness suspect multifactorial secondary to electrolyte derangement and dehydration PT OT to assess once hemodynamically stable and sodium level is above 130 Fall precautions. TOC to assist with DME's and possible home health services arrangement  Moderate protein calorie intake Albumin 2.6 BMI 27 Encourage oral intake.  History of right breast cancer Follows with hematology oncology.  Chronic anxiety/depression Resume home Xanax as needed Hold off home Effexor due to hyponatremia  Essential hypertension Blood pressures are currently soft Hold off home oral antihypertensives due to soft blood pressures Hold off home ramipril due to AKI.  GERD Resume home PPI  Hyperlipidemia Hold off home statin due to acute transaminitis     DVT prophylaxis: Subcu heparin 3 times daily.  Code Status: Full code.  Family Communication: None at bedside.  Disposition Plan: Admit to telemetry medical.  Consults called: None.  Admission status: Inpatient status.  Patient will require least 2 midnights  for further evaluation and treatment of present condition.   Status is: Inpatient   Dispo:  Patient From: Home  Planned Disposition: Home with Health Care Svc possibly on 05/19/2021 once symptomatology has improved.  Medically stable for discharge: No         Kayleen Memos MD [facility] Pager [phone]  If 7PM-7AM, please contact night-coverage [email or web address] Password [facility]  05/17/2021, 10:48 PM

## 2021-05-17 NOTE — ED Provider Notes (Signed)
[facility] Provider Note   CSN: [id number] Arrival date & time: 05/17/21  1605     History Chief Complaint  Patient presents with  . Fall  . Weakness    [name] is a 75 y.o. female.  Presents to ER with concern for fall and generalized weakness.  Patient reports that yesterday morning at some point she fell, thinks that she may have slipped somehow but does not recall exactly how she felt.  She denies any loss of consciousness.  Thinks that she may have hit her head.  Is having some pain in her left hip and left knee.  Moderate, worse with movement and improved with rest.  Has had difficulty with mobility ever since the fall.  He was down for a while before getting help.  Denies any chest pain, abdominal pain, no nausea or vomiting, no fevers or chills.  HPI     Past Medical History:  Diagnosis Date  . Allergic rhinitis   . Anginal pain ([place])   . Arthritis   . Asthma   . Breast cancer ([place]) 2015   Right Breast Cancer  . Coronary artery disease   . Emphysema of lung ([place])   . GERD (gastroesophageal reflux disease)   . Hypertension   . Lupus (systemic lupus erythematosus) ([place])   . Myocardial infarction [facility])     Patient Active Problem List   Diagnosis Date Noted  . Iron deficiency anemia 03/01/2017  . Menopausal hot flushes 09/22/2016  . Breast cancer of upper-outer quadrant of right female breast ([place]) 05/28/2014  . Abnormal mammogram with microcalcification 04/14/2014  . Essential hypertension 01/20/2013  . Persistent disorder of initiating or maintaining sleep 04/03/2012    Past Surgical History:  Procedure Laterality Date  . [place]  . MASTECTOMY Right 2015     OB History   No obstetric history on file.     Family History  Problem Relation Age of Onset  . Allergies Mother   . Heart disease Mother   . Diabetes Mother   . Hypertension Mother   . Cancer Father        Lung Cancer  .  Cancer Sister 80       lung cancer   . Cancer Maternal Aunt 78       colon cancer   . Cancer Maternal Aunt 90       uterine cancer     Social History   Tobacco Use  . Smoking status: Light Tobacco Smoker    Types: Cigarettes    Last attempt to quit: 04/10/2014    Years since quitting: 7.1  . Smokeless tobacco: Never Used  . Tobacco comment: 2 cigarettes a week  Substance Use Topics  . Alcohol use: Yes    Comment: occassional  . Drug use: No    Home Medications Prior to Admission medications   Medication Sig Start Date End Date Taking? Authorizing Provider  ALPRAZolam (XANAX) 0.25 MG tablet Take 0.25 mg by mouth 2 (two) times daily as needed.   Yes [provider]  amLODipine (NORVASC) 5 MG tablet Take 5 mg by mouth daily. 05/01/21  Yes [provider]  aspirin EC 81 MG EC tablet Take 1 tablet (81 mg total) by mouth daily. 03/14/13  Yes Charolette Forward, MD  exemestane (AROMASIN) 25 MG tablet Take 1 tablet (25 mg total) by mouth daily. 05/25/20  Yes Truitt Merle, MD  isosorbide mononitrate (IMDUR) 30 MG  24 hr tablet Take 30 mg by mouth daily.   Yes [provider]  KLOR-CON M20 20 MEQ tablet TAKE 1 TABLET BY MOUTH TWICE A DAY Patient taking differently: Take 20 mEq by mouth 2 (two) times daily. 04/05/18  Yes Truitt Merle, MD  metoprolol succinate (TOPROL-XL) 50 MG 24 hr tablet Take 50 mg by mouth daily. Take with or immediately following a meal.   Yes [provider]  nitroGLYCERIN (NITROSTAT) 0.4 MG SL tablet Place 0.4 mg under the tongue every 5 (five) minutes as needed for chest pain. Reported on 01/11/2016   Yes [provider]  pantoprazole (PROTONIX) 40 MG tablet Take 40 mg by mouth daily. 06/01/15  Yes [provider]  pravastatin (PRAVACHOL) 40 MG tablet Take 40 mg by mouth at bedtime.   Yes [provider]  predniSONE (DELTASONE) 5 MG tablet Take 5 mg by mouth daily. 05/02/21  Yes [provider]  ramipril (ALTACE) 10  MG capsule Take 10 mg by mouth 2 (two) times daily.   Yes [provider]  venlafaxine XR (EFFEXOR XR) 37.5 MG 24 hr capsule Take 1 capsule (37.5 mg total) by mouth daily with breakfast. 06/08/16  Yes Truitt Merle, MD  ofloxacin (OCUFLOX) 0.3 % ophthalmic solution Place 1 drop into the right eye 4 (four) times daily. Patient not taking: No sig reported 03/10/21   [provider]  prednisoLONE acetate (PRED FORTE) 1 % ophthalmic suspension Place 1 drop into the right eye 4 (four) times daily. Patient not taking: No sig reported 03/10/21   [provider]  PROLENSA 0.07 % SOLN Place 1 drop into the right eye daily. Patient not taking: No sig reported 03/10/21   [provider]    Allergies    Lipitor [atorvastatin]  Review of Systems   Review of Systems  Constitutional: Positive for fatigue. Negative for chills and fever.  HENT: Negative for ear pain and sore throat.   Eyes: Negative for pain and visual disturbance.  Respiratory: Negative for cough and shortness of breath.   Cardiovascular: Negative for chest pain and palpitations.  Gastrointestinal: Negative for abdominal pain and vomiting.  Genitourinary: Negative for dysuria and hematuria.  Musculoskeletal: Positive for arthralgias, back pain and myalgias.  Skin: Negative for color change and rash.  Neurological: Negative for seizures and syncope.  All other systems reviewed and are negative.   Physical Exam Updated Vital Signs BP 122/62   Pulse 73   Temp 98.4 F (36.9 C) (Oral)   Resp (!) 25   Ht 5\' 6"  (1.676 m)   Wt 78 kg   SpO2 92%   BMI 27.76 kg/m   Physical Exam Vitals and nursing note reviewed.  Constitutional:      General: She is not in acute distress.    Appearance: She is well-developed.     Comments: Chronically ill-appearing but in no acute distress  HENT:     Head: Normocephalic and atraumatic.  Eyes:     Conjunctiva/sclera: Conjunctivae normal.  Cardiovascular:     Rate  and Rhythm: Normal rate and regular rhythm.     Heart sounds: No murmur heard.   Pulmonary:     Effort: Pulmonary effort is normal. No respiratory distress.     Breath sounds: Normal breath sounds.  Abdominal:     Palpations: Abdomen is soft.     Tenderness: There is no abdominal tenderness.  Musculoskeletal:     Cervical back: Neck supple.     Comments: Back:  no C, T spine TTP, no step off or deformity; there is some TTP over L spine RUE: no TTP throughout, no deformity, normal joint ROM, radial pulse intact, distal sensation and motor intact LUE: no TTP throughout, no deformity, normal joint ROM, radial pulse intact, distal sensation and motor intact RLE:  no TTP throughout, no deformity, normal joint ROM, distal pulse, sensation and motor intact LLE: some TTP in knee and hip, normal joint ROM, distal pulse, sensation and motor intact  Skin:    General: Skin is warm and dry.  Neurological:     Mental Status: She is alert.     Comments: Alert, oriented x3, speech clear, moves all 4 extremities equally, sensation to light touch intact in all 4 extremities  Psychiatric:        Mood and Affect: Mood normal.     ED Results / Procedures / Treatments   Labs (all labs ordered are listed, but only abnormal results are displayed) Labs Reviewed  CK - Abnormal; Notable for the following components:      Result Value   Total CK 5,703 (*)    All other components within normal limits  CBC WITH DIFFERENTIAL/PLATELET - Abnormal; Notable for the following components:   WBC 15.7 (*)    Neutro Abs 13.9 (*)    Abs Immature Granulocytes 0.18 (*)    All other components within normal limits  COMPREHENSIVE METABOLIC PANEL - Abnormal; Notable for the following components:   Sodium 124 (*)    Potassium 3.2 (*)    Chloride 84 (*)    CO2 20 (*)    BUN 41 (*)    Creatinine, Ser 2.69 (*)    Calcium 8.5 (*)    Albumin 2.6 (*)    AST 381 (*)    ALT 146 (*)    Alkaline Phosphatase 199 (*)     Total Bilirubin 1.8 (*)    GFR, Estimated 18 (*)    Anion gap 20 (*)    All other components within normal limits  URINALYSIS, ROUTINE W REFLEX MICROSCOPIC - Abnormal; Notable for the following components:   Color, Urine AMBER (*)    APPearance CLOUDY (*)    Hgb urine dipstick LARGE (*)    Protein, ur 100 (*)    All other components within normal limits  LACTIC ACID, PLASMA - Abnormal; Notable for the following components:   Lactic Acid, Venous 2.0 (*)    All other components within normal limits  LACTIC ACID, PLASMA - Abnormal; Notable for the following components:   Lactic Acid, Venous 2.2 (*)    All other components within normal limits  RAPID URINE DRUG SCREEN, HOSP PERFORMED - Abnormal; Notable for the following components:   Benzodiazepines POSITIVE (*)    All other components within normal limits  RESP PANEL BY RT-PCR (FLU A&B, COVID) ARPGX2  CULTURE, BLOOD (ROUTINE X 2)  CULTURE, BLOOD (ROUTINE X 2)  URINE CULTURE  AMMONIA  ETHANOL  HEPATITIS PANEL, ACUTE  PROTIME-INR    EKG EKG Interpretation  Date/Time:  Tuesday May 17 2021 16:12:24 EDT Ventricular Rate:  92 PR Interval:  144 QRS Duration: 91 QT Interval:  437 QTC Calculation: 541 R Axis:   61 Text Interpretation: Sinus rhythm Ventricular premature complex Probable left atrial enlargement Prolonged QT interval Confirmed by Madalyn Rob [id number]) on 05/17/2021 5:56:13 PM   Radiology DG Chest 1 View  Result Date: 05/17/2021 CLINICAL DATA:  Fall yesterday, weakness, low back pain EXAM: CHEST  1 VIEW  COMPARISON:  03/13/2013 chest radiograph. FINDINGS: Stable cardiomediastinal silhouette with normal heart size. No pneumothorax. No pleural effusion. Mild left basilar atelectasis. Otherwise clear lungs with no pulmonary edema. No displaced fractures in the visualized chest. IMPRESSION: Mild left basilar atelectasis. Electronically Signed   By: Ilona Sorrel M.D.   On: 05/17/2021 19:26   DG Lumbar Spine  Complete  Result Date: 05/17/2021 CLINICAL DATA:  Fall yesterday, low back pain EXAM: LUMBAR SPINE - COMPLETE 4+ VIEW COMPARISON:  None. FINDINGS: This report assumes 5 non rib-bearing lumbar vertebrae. Lumbar vertebral body heights are preserved, with no fracture. Mild multilevel lumbar degenerative disc disease, most prominent at L3-4. Mild 4 mm anterolisthesis at L4-5. Mild bilateral lower lumbar facet arthropathy. No aggressive appearing focal osseous lesions. Coarse calcifications in the deep pelvis, potentially small calcified uterine fibroids. IMPRESSION: 1. No lumbar spine fracture. 2. Mild multilevel lumbar degenerative disc disease, most prominent at L3-4. 3. Mild 4 mm anterolisthesis at L4-5. Electronically Signed   By: Ilona Sorrel M.D.   On: 05/17/2021 19:29   DG Knee 1-2 Views Left  Result Date: 05/17/2021 CLINICAL DATA:  Left knee pain following a fall. EXAM: LEFT KNEE - 1-2 VIEW COMPARISON:  None. FINDINGS: In moderate anterior patellar enthesophyte fat at the site of quadriceps tendon insertion. No fracture, dislocation or effusion seen. IMPRESSION: No fracture or effusion. Electronically Signed   By: Claudie Revering M.D.   On: 05/17/2021 19:29   DG Knee 1-2 Views Right  Result Date: 05/17/2021 CLINICAL DATA:  Fall yesterday, right knee pain EXAM: RIGHT KNEE - 1-2 VIEW COMPARISON:  None. FINDINGS: No fracture, joint effusion or dislocation. Small superior right patellar enthesophyte. No focal osseous lesions. No radiopaque foreign bodies. No significant degenerative arthropathy. IMPRESSION: No right knee fracture, joint effusion or malalignment. Small superior right patellar enthesophyte. Electronically Signed   By: Ilona Sorrel M.D.   On: 05/17/2021 19:30   CT Head Wo Contrast  Result Date: 05/17/2021 CLINICAL DATA:  Status post fall. EXAM: CT HEAD WITHOUT CONTRAST TECHNIQUE: Contiguous axial images were obtained from the base of the skull through the vertex without intravenous contrast.  COMPARISON:  Apr 20, 2008 FINDINGS: Brain: There is mild cerebral atrophy with widening of the extra-axial spaces and ventricular dilatation. There are areas of decreased attenuation within the white matter tracts of the supratentorial brain, consistent with microvascular disease changes. Small chronic left basal ganglia lacunar infarcts are seen. Vascular: No hyperdense vessel or unexpected calcification. Skull: Normal. Negative for fracture or focal lesion. Sinuses/Orbits: No acute finding. Other: Very mild left frontal scalp soft tissue swelling is noted. IMPRESSION: 1. Generalized cerebral atrophy. 2. No acute intracranial abnormality. Electronically Signed   By: Virgina Norfolk M.D.   On: 05/17/2021 19:41   CT Cervical Spine Wo Contrast  Result Date: 05/17/2021 CLINICAL DATA:  Generalized weakness.  Fall yesterday. EXAM: CT CERVICAL SPINE WITHOUT CONTRAST TECHNIQUE: Multidetector CT imaging of the cervical spine was performed without intravenous contrast. Multiplanar CT image reconstructions were also generated. COMPARISON:  None. FINDINGS: Alignment: Patient is tilted. No traumatic subluxation. Mild straightening of normal lordosis. Skull base and vertebrae: No acute fracture. Vertebral body heights are maintained. The dens and skull base are intact. Soft tissues and spinal canal: No prevertebral fluid or swelling. No visible canal hematoma. Disc levels: Degenerative disc disease at multiple levels most prominently affecting C5-C6 and C6-C7. Scattered facet hypertrophy. No high-grade bony canal stenosis. Upper chest: No acute findings.  Emphysema. Other: None. IMPRESSION: Degenerative change in  the cervical spine without acute fracture or subluxation. Electronically Signed   By: Keith Rake M.D.   On: 05/17/2021 19:42   US Abdomen Limited  Result Date: 05/17/2021 CLINICAL DATA:  Right upper quadrant abdominal pain, elevated liver function tests EXAM: ULTRASOUND ABDOMEN LIMITED RIGHT UPPER QUADRANT  COMPARISON:  None. FINDINGS: Gallbladder: No gallstones or wall thickening visualized. No sonographic Murphy sign noted by sonographer. Common bile duct: Diameter: 6-7 mm in proximal diameter Liver: No focal lesion identified. Within normal limits in parenchymal echogenicity. Portal vein is patent on color Doppler imaging with normal direction of blood flow towards the liver. Other: None. IMPRESSION: Normal right upper quadrant sonogram Electronically Signed   By: Fidela Salisbury MD   On: 05/17/2021 21:32   DG Hip Unilat W or Wo Pelvis 2-3 Views Left  Result Date: 05/17/2021 CLINICAL DATA:  Fall.  Weakness. EXAM: DG HIP (WITH OR WITHOUT PELVIS) 2-3V LEFT COMPARISON:  None. FINDINGS: There is no evidence of hip fracture or dislocation. Mild bilateral hip osteoarthritis. Several calcified fibroids identified within the pelvis. IMPRESSION: 1. No acute findings. 2. Mild bilateral hip osteoarthritis. Electronically Signed   By: Kerby Moors M.D.   On: 05/17/2021 19:27    Procedures Procedures   Medications Ordered in ED Medications  vancomycin (VANCOREADY) IVPB 1500 mg/300 mL (1,500 mg Intravenous New Bag/Given 05/17/21 2049)  ceFEPIme (MAXIPIME) 2 g in sodium chloride 0.9 % 100 mL IVPB (has no administration in time range)  vancomycin variable dose per unstable renal function (pharmacist dosing) (has no administration in time range)  acetaminophen (TYLENOL) tablet 650 mg (650 mg Oral Given 05/17/21 1910)  lactated ringers bolus 1,000 mL (0 mLs Intravenous Stopped 05/17/21 2025)  ceFEPIme (MAXIPIME) 2 g in sodium chloride 0.9 % 100 mL IVPB (0 g Intravenous Stopped 05/17/21 2025)  metroNIDAZOLE (FLAGYL) IVPB 500 mg (0 mg Intravenous Stopped 05/17/21 2152)  lactated ringers bolus 1,000 mL (0 mLs Intravenous Stopped 05/17/21 2207)    ED Course  I have reviewed the triage vital signs and the nursing notes.  Pertinent labs & imaging results that were available during my care of the patient were reviewed by me and  considered in my medical decision making (see chart for details).    MDM Rules/Calculators/A&P                          75 year old lady presented to ER with generalized weakness and fall.  On initial assessment patient appeared stable in no distress but felt somewhat warm.  Rectal temp was elevated.  Concern for possibility of sepsis, infectious process.  Provided some fluids and broad-spectrum antibiotics.  Commenced broad traumatic and infectious work-up.  Lab work noted for leukocytosis, borderline lactate.  LFTs elevated, CK elevated.  UA negative for infection.  No clear pneumonia on chest x-ray.  Ultrasound of right upper quadrant was negative.  No neck pain neck stiffness, no change in mental status, doubt CNS infection.  Trauma work-up negative.  Patient's primary doctor is Dr. Terrence Dupont, discussed case with him and he requests patient be admitted to [facility].  Will admit to the hospitalist for further rehydration and observation.  Final Clinical Impression(s) / ED Diagnoses Final diagnoses:  Leukocytosis, unspecified type  Non-traumatic rhabdomyolysis  AKI (acute kidney injury) ([place])  Hyponatremia    Rx / DC Orders ED Discharge Orders    None       Lucrezia Starch, MD 05/17/21 2239

## 2021-05-18 DIAGNOSIS — W19XXXA Unspecified fall, initial encounter: Secondary | ICD-10-CM

## 2021-05-18 DIAGNOSIS — R6510 Systemic inflammatory response syndrome (SIRS) of non-infectious origin without acute organ dysfunction: Secondary | ICD-10-CM | POA: Diagnosis present

## 2021-05-18 DIAGNOSIS — I10 Essential (primary) hypertension: Secondary | ICD-10-CM

## 2021-05-18 DIAGNOSIS — E876 Hypokalemia: Secondary | ICD-10-CM | POA: Diagnosis present

## 2021-05-18 DIAGNOSIS — Y92009 Unspecified place in unspecified non-institutional (private) residence as the place of occurrence of the external cause: Secondary | ICD-10-CM

## 2021-05-18 DIAGNOSIS — E871 Hypo-osmolality and hyponatremia: Secondary | ICD-10-CM | POA: Diagnosis present

## 2021-05-18 DIAGNOSIS — T796XXA Traumatic ischemia of muscle, initial encounter: Secondary | ICD-10-CM | POA: Diagnosis present

## 2021-05-18 LAB — CBC
HCT: 37.3 % (ref 36.0–46.0)
Hemoglobin: 12.9 g/dL (ref 12.0–15.0)
MCH: 31.5 pg (ref 26.0–34.0)
MCHC: 34.6 g/dL (ref 30.0–36.0)
MCV: 91.2 fL (ref 80.0–100.0)
Platelets: 261 10*3/uL (ref 150–400)
RBC: 4.09 MIL/uL (ref 3.87–5.11)
RDW: 13.1 % (ref 11.5–15.5)
WBC: 11.6 10*3/uL — ABNORMAL HIGH (ref 4.0–10.5)
nRBC: 0 % (ref 0.0–0.2)

## 2021-05-18 LAB — COMPREHENSIVE METABOLIC PANEL
ALT: 124 U/L — ABNORMAL HIGH (ref 0–44)
AST: 301 U/L — ABNORMAL HIGH (ref 15–41)
Albumin: 2.2 g/dL — ABNORMAL LOW (ref 3.5–5.0)
Alkaline Phosphatase: 168 U/L — ABNORMAL HIGH (ref 38–126)
Anion gap: 17 — ABNORMAL HIGH (ref 5–15)
BUN: 40 mg/dL — ABNORMAL HIGH (ref 8–23)
CO2: 21 mmol/L — ABNORMAL LOW (ref 22–32)
Calcium: 8.6 mg/dL — ABNORMAL LOW (ref 8.9–10.3)
Chloride: 87 mmol/L — ABNORMAL LOW (ref 98–111)
Creatinine, Ser: 2.02 mg/dL — ABNORMAL HIGH (ref 0.44–1.00)
GFR, Estimated: 25 mL/min — ABNORMAL LOW (ref >60)
Glucose, Bld: 93 mg/dL (ref 70–99)
Potassium: 2.6 mmol/L — CL (ref 3.5–5.1)
Sodium: 125 mmol/L — ABNORMAL LOW (ref 135–145)
Total Bilirubin: 1.3 mg/dL — ABNORMAL HIGH (ref 0.3–1.2)
Total Protein: 5.8 g/dL — ABNORMAL LOW (ref 6.5–8.1)

## 2021-05-18 LAB — MAGNESIUM: Magnesium: 1.9 mg/dL (ref 1.7–2.4)

## 2021-05-18 LAB — LACTIC ACID, PLASMA: Lactic Acid, Venous: 1.2 mmol/L (ref 0.5–1.9)

## 2021-05-18 LAB — SODIUM
Sodium: 132 mmol/L — ABNORMAL LOW (ref 135–145)
Sodium: 131 mmol/L — ABNORMAL LOW (ref 135–145)
Sodium: 129 mmol/L — ABNORMAL LOW (ref 135–145)
Sodium: 130 mmol/L — ABNORMAL LOW (ref 135–145)

## 2021-05-18 LAB — HEPATITIS PANEL, ACUTE
HCV Ab: NONREACTIVE
Hep A IgM: NONREACTIVE
Hep B C IgM: NONREACTIVE
Hepatitis B Surface Ag: NONREACTIVE

## 2021-05-18 LAB — PHOSPHORUS: Phosphorus: 3.5 mg/dL (ref 2.5–4.6)

## 2021-05-18 LAB — CK: Total CK: 2708 U/L — ABNORMAL HIGH (ref 38–234)

## 2021-05-18 LAB — VANCOMYCIN, RANDOM: Vancomycin Rm: 12

## 2021-05-18 LAB — OSMOLALITY: Osmolality: 276 mOsm/kg (ref 275–295)

## 2021-05-18 LAB — POTASSIUM: Potassium: 2.8 mmol/L — ABNORMAL LOW (ref 3.5–5.1)

## 2021-05-18 MED ORDER — VANCOMYCIN VARIABLE DOSE PER UNSTABLE RENAL FUNCTION (PHARMACIST DOSING): Status: DC

## 2021-05-18 MED ORDER — POTASSIUM CHLORIDE 10 MEQ/100ML IV SOLN: 10.0000 meq | INTRAVENOUS | Status: DC

## 2021-05-18 MED ORDER — EXEMESTANE 25 MG PO TABS
25.0000 mg | ORAL_TABLET | Freq: Every day | ORAL | Status: DC
  Administered 2021-05-18 – 2021-05-23 (×6): 25 mg via ORAL
  Filled 2021-05-18 (×7): qty 1

## 2021-05-18 MED ORDER — POTASSIUM CHLORIDE CRYS ER 20 MEQ PO TBCR
40.0000 meq | EXTENDED_RELEASE_TABLET | ORAL | Status: AC
  Administered 2021-05-18 (×2): 40 meq via ORAL
  Filled 2021-05-18 (×2): qty 2

## 2021-05-18 MED ORDER — ISOSORBIDE MONONITRATE ER 30 MG PO TB24
30.0000 mg | ORAL_TABLET | Freq: Every day | ORAL | Status: DC
  Administered 2021-05-18 – 2021-05-24 (×7): 30 mg via ORAL
  Filled 2021-05-18 (×7): qty 1

## 2021-05-18 MED ORDER — POTASSIUM CHLORIDE 10 MEQ/100ML IV SOLN
10.0000 meq | INTRAVENOUS | Status: AC
  Administered 2021-05-18 (×2): 10 meq via INTRAVENOUS
  Filled 2021-05-18 (×2): qty 100

## 2021-05-18 MED ORDER — PREDNISONE 5 MG PO TABS
5.0000 mg | ORAL_TABLET | Freq: Every day | ORAL | Status: DC
  Administered 2021-05-18 – 2021-05-21 (×4): 5 mg via ORAL
  Filled 2021-05-18 (×4): qty 1

## 2021-05-18 MED ORDER — METOPROLOL SUCCINATE ER 25 MG PO TB24
50.0000 mg | ORAL_TABLET | Freq: Every day | ORAL | Status: DC
  Administered 2021-05-18 – 2021-05-24 (×6): 50 mg via ORAL
  Filled 2021-05-18 (×7): qty 2

## 2021-05-18 MED ORDER — AMLODIPINE BESYLATE 5 MG PO TABS
5.0000 mg | ORAL_TABLET | Freq: Every day | ORAL | Status: DC
  Administered 2021-05-18 – 2021-05-24 (×7): 5 mg via ORAL
  Filled 2021-05-18 (×7): qty 1

## 2021-05-18 MED ORDER — VANCOMYCIN HCL 1500 MG/300ML IV SOLN
1500.0000 mg | Freq: Once | INTRAVENOUS | Status: AC
  Administered 2021-05-18: 1500 mg via INTRAVENOUS
  Filled 2021-05-18: qty 300

## 2021-05-18 NOTE — Plan of Care (Signed)

## 2021-05-18 NOTE — Care Management (Signed)
Consult for Home health , DME needs. Secure chatted MD for PT/OT eval.

## 2021-05-18 NOTE — ED Notes (Signed)
Patient resting getting a blood draw at this time. Denies needs.

## 2021-05-18 NOTE — Progress Notes (Addendum)
Pharmacy Antibiotic Note  [name] is a 75 y.o. female admitted on 05/17/2021 with infection of unknown source.  Pharmacy has been consulted for cefepime and vancomycin dosing.  WBC 15.7 > 11.6. Tm 101.10F, SCr 2.69 > 2.02  Vancomycin random level this evenin = 12 around 24 hrs after last dose  Plan: -Continue Cefepime 2 gm IV Q 24 hours -Vancomycin 1500 mg IV x 1 now, f/u AM renal function for redosing. -Monitor CBC, renal fx, cultures and clinical progress  Height: 5\' 6"  (167.6 cm) Weight: 78 kg (172 lb) IBW/kg (Calculated) : 59.3  Temp (24hrs), Avg:98.1 F (36.7 C), Min:97.5 F (36.4 C), Max:98.8 F (37.1 C)  Recent Labs  Lab 05/17/21 1802 05/18/21 0327 05/18/21 0328 05/18/21 2150  WBC 15.7*  --  11.6*  --   CREATININE 2.69*  --  2.02*  --   LATICACIDVEN 2.2*  2.0* 1.2  --   --   VANCORANDOM  --   --   --  12    Estimated Creatinine Clearance: 25.4 mL/min (A) (by C-G formula based on SCr of 2.02 mg/dL (H)).    Allergies  Allergen Reactions  . Lipitor [Atorvastatin] Other (See Comments)    Pt states med makes her feel "loopy"    Antimicrobials this admission: Cefepime 6/7 >>  Vancomycin 6/7 >>   Dose adjustments this admission:  Microbiology results: 6/7 BCx: ngtd 6/7 UCx:  ngtd  Thank you for allowing pharmacy to be a part of this patient's care.  [place] Crane, Roylene Reason, BCCP Clinical Pharmacist  05/18/2021 10:43 PM   [facility] pharmacy phone numbers are listed on [email or web address]

## 2021-05-18 NOTE — Progress Notes (Signed)
PROGRESS NOTE    [name]  UXL:[id number] DOB: 1946-04-23 DOA: 05/17/2021 PCP: Charolette Forward, MD    Brief Narrative:  75 year old female who had fallen at home and was unable to get up for a prolonged period of time.  Presents with acute kidney injury, rhabdomyolysis and electrolyte abnormalities including hypokalemia and hyponatremia.  She admitted for further IV fluids and correction of electrolytes.   Assessment & Plan:   Active Problems:   Essential hypertension   AKI (acute kidney injury) ([place])   Traumatic rhabdomyolysis ([place])   Hypokalemia   Hyponatremia   Fall at home, initial encounter   SIRS (systemic inflammatory response syndrome) ([facility])   Acute kidney injury -Secondary to rhabdomyolysis in the setting of dehydration, ACE inhibitor use -Mild improvement of creatinine since admission -Continue on IV fluids -Hold ACE inhibitor -Follow urine output  Rhabdomyolysis  -Secondary to fall and prolonged immobilization -Patient was unable to get off the ground for a prolonged amount of time -Imaging did not indicate any bony injuries -Patient's cousin reports that patient tripped on her outdoor stairs while walking to her house. -Continue IV hydration -CK 5703->2708  SIRS -No clear source of infection has been identified -She did have a fever on arrival -Cultures have been sent -She was started on intravenous antibiotics -Plan to discontinue further antibiotics in the next 24 hours if she remains afebrile  Hypokalemia -Replace -Magnesium level normal  Acute transaminitis -Regular quadrant ultrasound benign -Suspect this is secondary to rhabdomyolysis -Continue to trend -Hold statin  Hypertension -Holding ACE inhibitor -Resume amlodipine and Toprol  Generalized weakness -Physical therapy evaluation requested -Reports that she normally ambulates independently  GERD -Continue on PPI  Hyponatremia -Suspect is related to hypovolemia -Continue IV  fluids -Hold Effexor for now  Prolonged QTC -Noted to have QTC of 541 on presentation -Continue to correct electrolytes including potassium -Magnesium normal  DVT prophylaxis: heparin injection 5,000 Units Start: 05/17/21 2315  Code Status: Full code Family Communication: Patient requested that her sister Otilio Saber be her primary contact. I was unable to reach [place] over the phone. I updated her cousin Consuella over the phone Disposition Plan: Status is: Inpatient  Remains inpatient appropriate because:Ongoing diagnostic testing needed not appropriate for outpatient work up, IV treatments appropriate due to intensity of illness or inability to take PO and Inpatient level of care appropriate due to severity of illness   Dispo:  Patient From: Home  Planned Disposition: Home with Health Care Svc  Medically stable for discharge: No     Consultants:     Procedures:     Antimicrobials:   Vancomycin 6/7 > 6/8  Cefepime 6/7 >    Subjective: Patient is aware that she is in the hospital, also worried that she had a fall prior to admission and was unable to stand.  She recognized that she was laying on the ground for a prolonged period of time.  Cannot describe the circumstances of the fall.  Denies tripping over any objects or feeling lightheaded prior to fall.  Objective: Vitals:   05/18/21 0400 05/18/21 0700 05/18/21 0800 05/18/21 1000  BP: 123/69 (!) 142/71 (!) 142/74 (!) 141/66  Pulse: 79 77 72 76  Resp: (!) 26 (!) 24 (!) 26 (!) 24  Temp:   98.8 F (37.1 C)   TempSrc:   Oral   SpO2: 92% 94% 93% 94%  Weight:      Height:        Intake/Output Summary (Last 24 hours) at 05/18/2021  1110 Last data filed at 05/18/2021 0512 Gross per 24 hour  Intake --  Output 1200 ml  Net -1200 ml   Filed Weights   05/17/21 1614  Weight: 78 kg    Examination:  General exam: Appears calm and comfortable  Respiratory system: Clear to auscultation. Respiratory effort  normal. Cardiovascular system: S1 & S2 heard, RRR. No JVD, murmurs, rubs, gallops or clicks. No pedal edema. Gastrointestinal system: Abdomen is nondistended, soft and nontender. No organomegaly or masses felt. Normal bowel sounds heard. Central nervous system: Alert and oriented. No focal neurological deficits. Extremities: bilateral lower extremity weakness Skin: No rashes, lesions or ulcers Psychiatry: Judgement and insight appear normal. Mood & affect appropriate.     Data Reviewed: I have personally reviewed following labs and imaging studies  CBC: Recent Labs  Lab 05/17/21 1802 05/18/21 0328  WBC 15.7* 11.6*  NEUTROABS 13.9*  --   HGB 14.3 12.9  HCT 41.5 37.3  MCV 92.0 91.2  PLT 265 829   Basic Metabolic Panel: Recent Labs  Lab 05/17/21 1802 05/18/21 0034 05/18/21 0231 05/18/21 0328 05/18/21 0810  NA 124* 132* 131* 125* 129*  K 3.2*  --   --  2.6*  --   CL 84*  --   --  87*  --   CO2 20*  --   --  21*  --   GLUCOSE 76  --   --  93  --   BUN 41*  --   --  40*  --   CREATININE 2.69*  --   --  2.02*  --   CALCIUM 8.5*  --   --  8.6*  --   MG  --   --   --  1.9  --   PHOS  --   --   --  3.5  --    GFR: Estimated Creatinine Clearance: 25.4 mL/min (A) (by C-G formula based on SCr of 2.02 mg/dL (H)). Liver Function Tests: Recent Labs  Lab 05/17/21 1802 05/18/21 0328  AST 381* 301*  ALT 146* 124*  ALKPHOS 199* 168*  BILITOT 1.8* 1.3*  PROT 6.7 5.8*  ALBUMIN 2.6* 2.2*   No results for input(s): LIPASE, AMYLASE in the last 168 hours. Recent Labs  Lab 05/17/21 1802  AMMONIA 26   Coagulation Profile: Recent Labs  Lab 05/17/21 2152  INR 1.6*   Cardiac Enzymes: Recent Labs  Lab 05/17/21 1802 05/18/21 0231  CKTOTAL 5,703* 2,708*   BNP (last 3 results) No results for input(s): PROBNP in the last 8760 hours. HbA1C: No results for input(s): HGBA1C in the last 72 hours. CBG: No results for input(s): GLUCAP in the last 168 hours. Lipid Profile: No  results for input(s): CHOL, HDL, LDLCALC, TRIG, CHOLHDL, LDLDIRECT in the last 72 hours. Thyroid Function Tests: No results for input(s): TSH, T4TOTAL, FREET4, T3FREE, THYROIDAB in the last 72 hours. Anemia Panel: No results for input(s): VITAMINB12, FOLATE, FERRITIN, TIBC, IRON, RETICCTPCT in the last 72 hours. Sepsis Labs: Recent Labs  Lab 05/17/21 1802 05/18/21 0327  LATICACIDVEN 2.2*  2.0* 1.2    Recent Results (from the past 240 hour(s))  Resp Panel by RT-PCR (Flu A&B, Covid) Nasopharyngeal Swab     Status: None   Collection Time: 05/17/21  6:07 PM   Specimen: Nasopharyngeal Swab; Nasopharyngeal(NP) swabs in vial transport medium  Result Value Ref Range Status   SARS Coronavirus 2 by RT PCR NEGATIVE NEGATIVE Final    Comment: (NOTE) SARS-CoV-2 target nucleic acids  are NOT DETECTED.  The SARS-CoV-2 RNA is generally detectable in upper respiratory specimens during the acute phase of infection. The lowest concentration of SARS-CoV-2 viral copies this assay can detect is 138 copies/mL. A negative result does not preclude SARS-Cov-2 infection and should not be used as the sole basis for treatment or other patient management decisions. A negative result may occur with  improper specimen collection/handling, submission of specimen other than nasopharyngeal swab, presence of viral mutation(s) within the areas targeted by this assay, and inadequate number of viral copies(<138 copies/mL). A negative result must be combined with clinical observations, patient history, and epidemiological information. The expected result is Negative.  Fact Sheet for Patients:  EntrepreneurPulse.com.au  Fact Sheet for Healthcare Providers:  IncredibleEmployment.be  This test is no t yet approved or cleared by the [place] FDA and  has been authorized for detection and/or diagnosis of SARS-CoV-2 by FDA under an Emergency Use Authorization (EUA). This EUA will  remain  in effect (meaning this test can be used) for the duration of the COVID-19 declaration under Section 564(b)(1) of the Act, 21 U.S.C.section 360bbb-3(b)(1), unless the authorization is terminated  or revoked sooner.       Influenza A by PCR NEGATIVE NEGATIVE Final   Influenza B by PCR NEGATIVE NEGATIVE Final    Comment: (NOTE) The Xpert Xpress SARS-CoV-2/FLU/RSV plus assay is intended as an aid in the diagnosis of influenza from Nasopharyngeal swab specimens and should not be used as a sole basis for treatment. Nasal washings and aspirates are unacceptable for Xpert Xpress SARS-CoV-2/FLU/RSV testing.  Fact Sheet for Patients: EntrepreneurPulse.com.au  Fact Sheet for Healthcare Providers: IncredibleEmployment.be  This test is not yet approved or cleared by the [place] FDA and has been authorized for detection and/or diagnosis of SARS-CoV-2 by FDA under an Emergency Use Authorization (EUA). This EUA will remain in effect (meaning this test can be used) for the duration of the COVID-19 declaration under Section 564(b)(1) of the Act, 21 U.S.C. section 360bbb-3(b)(1), unless the authorization is terminated or revoked.  Performed at [facility], [place] [address], [place], [place] [address]   Blood culture (routine x 2)     Status: None (Preliminary result)   Collection Time: 05/17/21  7:22 PM   Specimen: BLOOD  Result Value Ref Range Status   Specimen Description BLOOD SITE NOT SPECIFIED  Final   Special Requests   Final    BOTTLES DRAWN AEROBIC AND ANAEROBIC Blood Culture adequate volume   Culture   Final    NO GROWTH < 12 HOURS Performed at [facility], [place] [address], [place], [place] [address]    Report Status PENDING  Incomplete         Radiology Studies: DG Chest 1 View  Result Date: 05/17/2021 CLINICAL DATA:  Fall yesterday, weakness, low back pain EXAM: CHEST  1 VIEW COMPARISON:  03/13/2013 chest  radiograph. FINDINGS: Stable cardiomediastinal silhouette with normal heart size. No pneumothorax. No pleural effusion. Mild left basilar atelectasis. Otherwise clear lungs with no pulmonary edema. No displaced fractures in the visualized chest. IMPRESSION: Mild left basilar atelectasis. Electronically Signed   By: Ilona Sorrel M.D.   On: 05/17/2021 19:26   DG Lumbar Spine Complete  Result Date: 05/17/2021 CLINICAL DATA:  Fall yesterday, low back pain EXAM: LUMBAR SPINE - COMPLETE 4+ VIEW COMPARISON:  None. FINDINGS: This report assumes 5 non rib-bearing lumbar vertebrae. Lumbar vertebral body heights are preserved, with no fracture. Mild multilevel lumbar degenerative disc  disease, most prominent at L3-4. Mild 4 mm anterolisthesis at L4-5. Mild bilateral lower lumbar facet arthropathy. No aggressive appearing focal osseous lesions. Coarse calcifications in the deep pelvis, potentially small calcified uterine fibroids. IMPRESSION: 1. No lumbar spine fracture. 2. Mild multilevel lumbar degenerative disc disease, most prominent at L3-4. 3. Mild 4 mm anterolisthesis at L4-5. Electronically Signed   By: Ilona Sorrel M.D.   On: 05/17/2021 19:29   DG Knee 1-2 Views Left  Result Date: 05/17/2021 CLINICAL DATA:  Left knee pain following a fall. EXAM: LEFT KNEE - 1-2 VIEW COMPARISON:  None. FINDINGS: In moderate anterior patellar enthesophyte fat at the site of quadriceps tendon insertion. No fracture, dislocation or effusion seen. IMPRESSION: No fracture or effusion. Electronically Signed   By: Claudie Revering M.D.   On: 05/17/2021 19:29   DG Knee 1-2 Views Right  Result Date: 05/17/2021 CLINICAL DATA:  Fall yesterday, right knee pain EXAM: RIGHT KNEE - 1-2 VIEW COMPARISON:  None. FINDINGS: No fracture, joint effusion or dislocation. Small superior right patellar enthesophyte. No focal osseous lesions. No radiopaque foreign bodies. No significant degenerative arthropathy. IMPRESSION: No right knee fracture, joint  effusion or malalignment. Small superior right patellar enthesophyte. Electronically Signed   By: Ilona Sorrel M.D.   On: 05/17/2021 19:30   CT Head Wo Contrast  Result Date: 05/17/2021 CLINICAL DATA:  Status post fall. EXAM: CT HEAD WITHOUT CONTRAST TECHNIQUE: Contiguous axial images were obtained from the base of the skull through the vertex without intravenous contrast. COMPARISON:  Apr 20, 2008 FINDINGS: Brain: There is mild cerebral atrophy with widening of the extra-axial spaces and ventricular dilatation. There are areas of decreased attenuation within the white matter tracts of the supratentorial brain, consistent with microvascular disease changes. Small chronic left basal ganglia lacunar infarcts are seen. Vascular: No hyperdense vessel or unexpected calcification. Skull: Normal. Negative for fracture or focal lesion. Sinuses/Orbits: No acute finding. Other: Very mild left frontal scalp soft tissue swelling is noted. IMPRESSION: 1. Generalized cerebral atrophy. 2. No acute intracranial abnormality. Electronically Signed   By: Virgina Norfolk M.D.   On: 05/17/2021 19:41   CT Cervical Spine Wo Contrast  Result Date: 05/17/2021 CLINICAL DATA:  Generalized weakness.  Fall yesterday. EXAM: CT CERVICAL SPINE WITHOUT CONTRAST TECHNIQUE: Multidetector CT imaging of the cervical spine was performed without intravenous contrast. Multiplanar CT image reconstructions were also generated. COMPARISON:  None. FINDINGS: Alignment: Patient is tilted. No traumatic subluxation. Mild straightening of normal lordosis. Skull base and vertebrae: No acute fracture. Vertebral body heights are maintained. The dens and skull base are intact. Soft tissues and spinal canal: No prevertebral fluid or swelling. No visible canal hematoma. Disc levels: Degenerative disc disease at multiple levels most prominently affecting C5-C6 and C6-C7. Scattered facet hypertrophy. No high-grade bony canal stenosis. Upper chest: No acute  findings.  Emphysema. Other: None. IMPRESSION: Degenerative change in the cervical spine without acute fracture or subluxation. Electronically Signed   By: Keith Rake M.D.   On: 05/17/2021 19:42   US Abdomen Limited  Result Date: 05/17/2021 CLINICAL DATA:  Right upper quadrant abdominal pain, elevated liver function tests EXAM: ULTRASOUND ABDOMEN LIMITED RIGHT UPPER QUADRANT COMPARISON:  None. FINDINGS: Gallbladder: No gallstones or wall thickening visualized. No sonographic Murphy sign noted by sonographer. Common bile duct: Diameter: 6-7 mm in proximal diameter Liver: No focal lesion identified. Within normal limits in parenchymal echogenicity. Portal vein is patent on color Doppler imaging with normal direction of blood flow towards the liver. Other:  None. IMPRESSION: Normal right upper quadrant sonogram Electronically Signed   By: Fidela Salisbury MD   On: 05/17/2021 21:32   DG Hip Unilat W or Wo Pelvis 2-3 Views Left  Result Date: 05/17/2021 CLINICAL DATA:  Fall.  Weakness. EXAM: DG HIP (WITH OR WITHOUT PELVIS) 2-3V LEFT COMPARISON:  None. FINDINGS: There is no evidence of hip fracture or dislocation. Mild bilateral hip osteoarthritis. Several calcified fibroids identified within the pelvis. IMPRESSION: 1. No acute findings. 2. Mild bilateral hip osteoarthritis. Electronically Signed   By: Kerby Moors M.D.   On: 05/17/2021 19:27        Scheduled Meds: . aspirin EC  81 mg Oral Daily  . heparin injection (subcutaneous)  5,000 Units Subcutaneous Q8H  . pantoprazole  40 mg Oral Daily  . potassium chloride SA  20 mEq Oral BID   Continuous Infusions: . 0.9 % NaCl with KCl 20 mEq / L 50 mL/hr at 05/18/21 0424  . ceFEPime (MAXIPIME) IV       LOS: 1 day    Time spent: 64mins    Kathie Dike, MD [facility]   If 7PM-7AM, please contact night-coverage [email or web address]  05/18/2021, 11:10 AM  \

## 2021-05-18 NOTE — ED Notes (Signed)
Nurse report given to the floor nurse

## 2021-05-19 LAB — CBC
HCT: 32.6 % — ABNORMAL LOW (ref 36.0–46.0)
Hemoglobin: 11.1 g/dL — ABNORMAL LOW (ref 12.0–15.0)
MCH: 31.2 pg (ref 26.0–34.0)
MCHC: 34 g/dL (ref 30.0–36.0)
MCV: 91.6 fL (ref 80.0–100.0)
Platelets: 283 10*3/uL (ref 150–400)
RBC: 3.56 MIL/uL — ABNORMAL LOW (ref 3.87–5.11)
RDW: 13.2 % (ref 11.5–15.5)
WBC: 10.2 10*3/uL (ref 4.0–10.5)
nRBC: 0 % (ref 0.0–0.2)

## 2021-05-19 LAB — URINE CULTURE: Culture: NO GROWTH

## 2021-05-19 LAB — COMPREHENSIVE METABOLIC PANEL
ALT: 98 U/L — ABNORMAL HIGH (ref 0–44)
AST: 164 U/L — ABNORMAL HIGH (ref 15–41)
Albumin: 1.9 g/dL — ABNORMAL LOW (ref 3.5–5.0)
Alkaline Phosphatase: 151 U/L — ABNORMAL HIGH (ref 38–126)
Anion gap: 11 (ref 5–15)
BUN: 20 mg/dL (ref 8–23)
CO2: 18 mmol/L — ABNORMAL LOW (ref 22–32)
Calcium: 8.4 mg/dL — ABNORMAL LOW (ref 8.9–10.3)
Chloride: 101 mmol/L (ref 98–111)
Creatinine, Ser: 0.94 mg/dL (ref 0.44–1.00)
GFR, Estimated: >60 mL/min (ref >60)
Glucose, Bld: 105 mg/dL — ABNORMAL HIGH (ref 70–99)
Potassium: 3.9 mmol/L (ref 3.5–5.1)
Sodium: 130 mmol/L — ABNORMAL LOW (ref 135–145)
Total Bilirubin: 1.1 mg/dL (ref 0.3–1.2)
Total Protein: 5.4 g/dL — ABNORMAL LOW (ref 6.5–8.1)

## 2021-05-19 LAB — CK: Total CK: 1137 U/L — ABNORMAL HIGH (ref 38–234)

## 2021-05-19 MED ORDER — POTASSIUM CHLORIDE CRYS ER 20 MEQ PO TBCR
20.0000 meq | EXTENDED_RELEASE_TABLET | Freq: Two times a day (BID) | ORAL | Status: DC
  Administered 2021-05-19: 20 meq via ORAL
  Filled 2021-05-19: qty 1

## 2021-05-19 MED ORDER — POLYETHYLENE GLYCOL 3350 17 G PO PACK
17.0000 g | PACK | Freq: Every day | ORAL | Status: DC
  Administered 2021-05-19: 17 g via ORAL
  Filled 2021-05-19 (×3): qty 1

## 2021-05-19 MED ORDER — VANCOMYCIN HCL 1250 MG/250ML IV SOLN: 1250.0000 mg | INTRAVENOUS | Status: DC

## 2021-05-19 MED ORDER — SODIUM CHLORIDE 0.9 % IV SOLN
2.0000 g | Freq: Two times a day (BID) | INTRAVENOUS | Status: DC
  Administered 2021-05-19: 2 g via INTRAVENOUS
  Filled 2021-05-19: qty 2

## 2021-05-19 NOTE — Progress Notes (Signed)
Pharmacy Antibiotic Note  [name] is a 75 y.o. female admitted on 05/17/2021 with  infection of unknown source .  Pharmacy has been consulted for cefepime and vancomycin dosing.  Renal function continues to improve; therefore, will schedule vancomycin.  Afebrile, WBC normalized.  Plan: Vanc 1250mg  IV Q24H for AUC 499 using SCr 0.94 Cefepime 2gm IV Q12H Monitor renal fxn, clinical progress, vanc levels as indicated  Height: 5\' 6"  (167.6 cm) Weight: 78 kg (172 lb) IBW/kg (Calculated) : 59.3  Temp (24hrs), Avg:97.9 F (36.6 C), Min:97.5 F (36.4 C), Max:98.8 F (37.1 C)  Recent Labs  Lab 05/17/21 1802 05/18/21 0327 05/18/21 0328 05/18/21 2150 05/19/21 0021  WBC 15.7*  --  11.6*  --  10.2  CREATININE 2.69*  --  2.02*  --  0.94  LATICACIDVEN 2.2*  2.0* 1.2  --   --   --   VANCORANDOM  --   --   --  12  --      Estimated Creatinine Clearance: 54.5 mL/min (by C-G formula based on SCr of 0.94 mg/dL).    Allergies  Allergen Reactions   Lipitor [Atorvastatin] Other (See Comments)    Pt states med makes her feel "loopy"    Flagyl x1 6/7 Cefepime 6/7>> Vanc 6/7 >>   6/8 VR = 12 mcg/mL (~24 hr post load) >> redose 1500mg  x 1 6/9 schedule vanc with improved renal fxn   6/7 UCx - 6/7 BCx -  Rishaan Gunner D. Mina Marble, PharmD, BCPS, [place] 05/19/2021, 7:14 AM

## 2021-05-19 NOTE — Progress Notes (Addendum)
PROGRESS NOTE    [name]  DJM:[id number] DOB: Jul 29, 1946 DOA: 05/17/2021 PCP: Charolette Forward, MD    Brief Narrative:  75 year old female who had fallen at home and was unable to get up for a prolonged period of time.  Presents with acute kidney injury, rhabdomyolysis and electrolyte abnormalities including hypokalemia and hyponatremia.  She admitted for further IV fluids and correction of electrolytes.   Assessment & Plan:   Active Problems:   Essential hypertension   AKI (acute kidney injury) ([place])   Traumatic rhabdomyolysis ([place])   Hypokalemia   Hyponatremia   Fall at home, initial encounter   SIRS (systemic inflammatory response syndrome) ([facility])   Acute kidney injury -Secondary to rhabdomyolysis in the setting of dehydration, ACE inhibitor use -creatinine 2.69 on admission -She was treated with IV fluids -Hold ACE inhibitor -creatinine has improved to 0.9 -Follow urine output  Rhabdomyolysis  -Secondary to fall and prolonged immobilization -Patient was unable to get off the ground for a prolonged amount of time -Imaging did not indicate any bony injuries -Improved with IV hydration -CK 5703->2708->1137  SIRS -No clear source of infection has been identified -She did have a fever on arrival -Blood cultures have shown no growth -She was started on intravenous antibiotics -Since she is clinically improved, without source of infection and is now afebrile, will discontinue further antibiotics and observe  Hypokalemia -Replaced -Magnesium level normal  Acute transaminitis -Regular quadrant ultrasound benign -Suspect this is secondary to rhabdomyolysis -Continue to trend -Hold statin  Hypertension -Holding ACE inhibitor -continue on amlodipine and Toprol  Generalized weakness -Physical therapy evaluation requested -Reports that she normally ambulates independently  GERD -Continue on PPI  Hyponatremia -Suspect is related to hypovolemia -some  improvement with IV fluids -Hold Effexor for now  Prolonged QTC -Noted to have QTC of 541 on presentation, repeat EKG with QTc of 500 -Continue to correct electrolytes including potassium -Magnesium normal  Hypoalbuminemia -related to poor po intake -will request nutrition consult  DVT prophylaxis: heparin injection 5,000 Units Start: 05/17/21 2315  Code Status: Full code Family Communication: Patient requested that her sister Otilio Saber be her primary contact. I was unable to reach [place] over the phone today.  Disposition Plan: Status is: Inpatient  Remains inpatient appropriate because:Ongoing diagnostic testing needed not appropriate for outpatient work up, IV treatments appropriate due to intensity of illness or inability to take PO and Inpatient level of care appropriate due to severity of illness   Dispo:  Patient From: Home  Planned Disposition: Home with Health Care Svc  Medically stable for discharge: No     Consultants:    Procedures:    Antimicrobials:  Vancomycin 6/7 > 6/8 Cefepime 6/7 > 6/8   Subjective: She is feeling better. Denies any pain or shortness of breath at this time  Objective: Vitals:   05/18/21 1954 05/19/21 0201 05/19/21 0503 05/19/21 0840  BP: 137/69 (!) 152/92 (!) 146/70   Pulse: 74 78 64 (!) 59  Resp: 16 16 16    Temp: 98 F (36.7 C) 97.6 F (36.4 C) (!) 97.5 F (36.4 C)   TempSrc: Oral Oral Oral   SpO2: 100% 95% 100%   Weight:      Height:        Intake/Output Summary (Last 24 hours) at 05/19/2021 1249 Last data filed at 05/19/2021 0503 Gross per 24 hour  Intake 2762.74 ml  Output 800 ml  Net 1962.74 ml   Filed Weights   05/17/21 1614  Weight: 78 kg  Examination:  General exam: Alert, awake, oriented x 3 Respiratory system: Clear to auscultation. Respiratory effort normal. Cardiovascular system:RRR. No murmurs, rubs, gallops. Gastrointestinal system: Abdomen is nondistended, soft and nontender. No organomegaly or  masses felt. Normal bowel sounds heard. Central nervous system: Alert and oriented. No focal neurological deficits. Extremities: No C/C/E, +pedal pulses Skin: No rashes, lesions or ulcers Psychiatry: Judgement and insight appear normal. Mood & affect appropriate.      Data Reviewed: I have personally reviewed following labs and imaging studies  CBC: Recent Labs  Lab 05/17/21 1802 05/18/21 0328 05/19/21 0021  WBC 15.7* 11.6* 10.2  NEUTROABS 13.9*  --   --   HGB 14.3 12.9 11.1*  HCT 41.5 37.3 32.6*  MCV 92.0 91.2 91.6  PLT 265 261 109   Basic Metabolic Panel: Recent Labs  Lab 05/17/21 1802 05/18/21 0034 05/18/21 0231 05/18/21 0328 05/18/21 0810 05/18/21 1031 05/19/21 0021  NA 124*   < > 131* 125* 129* 130* 130*  K 3.2*  --   --  2.6*  --  2.8* 3.9  CL 84*  --   --  87*  --   --  101  CO2 20*  --   --  21*  --   --  18*  GLUCOSE 76  --   --  93  --   --  105*  BUN 41*  --   --  40*  --   --  20  CREATININE 2.69*  --   --  2.02*  --   --  0.94  CALCIUM 8.5*  --   --  8.6*  --   --  8.4*  MG  --   --   --  1.9  --   --   --   PHOS  --   --   --  3.5  --   --   --    < > = values in this interval not displayed.   GFR: Estimated Creatinine Clearance: 54.5 mL/min (by C-G formula based on SCr of 0.94 mg/dL). Liver Function Tests: Recent Labs  Lab 05/17/21 1802 05/18/21 0328 05/19/21 0021  AST 381* 301* 164*  ALT 146* 124* 98*  ALKPHOS 199* 168* 151*  BILITOT 1.8* 1.3* 1.1  PROT 6.7 5.8* 5.4*  ALBUMIN 2.6* 2.2* 1.9*   No results for input(s): LIPASE, AMYLASE in the last 168 hours. Recent Labs  Lab 05/17/21 1802  AMMONIA 26   Coagulation Profile: Recent Labs  Lab 05/17/21 2152  INR 1.6*   Cardiac Enzymes: Recent Labs  Lab 05/17/21 1802 05/18/21 0231 05/19/21 0021  CKTOTAL 5,703* 2,708* 1,137*   BNP (last 3 results) No results for input(s): PROBNP in the last 8760 hours. HbA1C: No results for input(s): HGBA1C in the last 72 hours. CBG: No  results for input(s): GLUCAP in the last 168 hours. Lipid Profile: No results for input(s): CHOL, HDL, LDLCALC, TRIG, CHOLHDL, LDLDIRECT in the last 72 hours. Thyroid Function Tests: No results for input(s): TSH, T4TOTAL, FREET4, T3FREE, THYROIDAB in the last 72 hours. Anemia Panel: No results for input(s): VITAMINB12, FOLATE, FERRITIN, TIBC, IRON, RETICCTPCT in the last 72 hours. Sepsis Labs: Recent Labs  Lab 05/17/21 1802 05/18/21 0327  LATICACIDVEN 2.2*  2.0* 1.2    Recent Results (from the past 240 hour(s))  Resp Panel by RT-PCR (Flu A&B, Covid) Nasopharyngeal Swab     Status: None   Collection Time: 05/17/21  6:07 PM   Specimen: Nasopharyngeal Swab; Nasopharyngeal(NP) swabs  in vial transport medium  Result Value Ref Range Status   SARS Coronavirus 2 by RT PCR NEGATIVE NEGATIVE Final    Comment: (NOTE) SARS-CoV-2 target nucleic acids are NOT DETECTED.  The SARS-CoV-2 RNA is generally detectable in upper respiratory specimens during the acute phase of infection. The lowest concentration of SARS-CoV-2 viral copies this assay can detect is 138 copies/mL. A negative result does not preclude SARS-Cov-2 infection and should not be used as the sole basis for treatment or other patient management decisions. A negative result may occur with  improper specimen collection/handling, submission of specimen other than nasopharyngeal swab, presence of viral mutation(s) within the areas targeted by this assay, and inadequate number of viral copies(<138 copies/mL). A negative result must be combined with clinical observations, patient history, and epidemiological information. The expected result is Negative.  Fact Sheet for Patients:  EntrepreneurPulse.com.au  Fact Sheet for Healthcare Providers:  IncredibleEmployment.be  This test is no t yet approved or cleared by the [place] FDA and  has been authorized for detection and/or diagnosis of  SARS-CoV-2 by FDA under an Emergency Use Authorization (EUA). This EUA will remain  in effect (meaning this test can be used) for the duration of the COVID-19 declaration under Section 564(b)(1) of the Act, 21 U.S.C.section 360bbb-3(b)(1), unless the authorization is terminated  or revoked sooner.       Influenza A by PCR NEGATIVE NEGATIVE Final   Influenza B by PCR NEGATIVE NEGATIVE Final    Comment: (NOTE) The Xpert Xpress SARS-CoV-2/FLU/RSV plus assay is intended as an aid in the diagnosis of influenza from Nasopharyngeal swab specimens and should not be used as a sole basis for treatment. Nasal washings and aspirates are unacceptable for Xpert Xpress SARS-CoV-2/FLU/RSV testing.  Fact Sheet for Patients: EntrepreneurPulse.com.au  Fact Sheet for Healthcare Providers: IncredibleEmployment.be  This test is not yet approved or cleared by the [place] FDA and has been authorized for detection and/or diagnosis of SARS-CoV-2 by FDA under an Emergency Use Authorization (EUA). This EUA will remain in effect (meaning this test can be used) for the duration of the COVID-19 declaration under Section 564(b)(1) of the Act, 21 U.S.C. section 360bbb-3(b)(1), unless the authorization is terminated or revoked.  Performed at [facility], [place] [address] [place], [place] [address]   Urine culture     Status: None   Collection Time: 05/17/21  6:15 PM   Specimen: In/Out Cath Urine  Result Value Ref Range Status   Specimen Description IN/OUT CATH URINE  Final   Special Requests NONE  Final   Culture   Final    NO GROWTH Performed at [facility], [place] [address], [place], [place] [address]    Report Status 05/19/2021 FINAL  Final  Blood culture (routine x 2)     Status: None (Preliminary result)   Collection Time: 05/17/21  7:22 PM   Specimen: BLOOD  Result Value Ref Range Status   Specimen Description BLOOD SITE NOT SPECIFIED  Final    Special Requests   Final    BOTTLES DRAWN AEROBIC AND ANAEROBIC Blood Culture adequate volume   Culture   Final    NO GROWTH 2 DAYS Performed at [facility], [place] [address] [place], [place] [address]    Report Status PENDING  Incomplete         Radiology Studies: DG Chest 1 View  Result Date: 05/17/2021 CLINICAL DATA:  Fall yesterday, weakness, low back pain EXAM: CHEST  1  VIEW COMPARISON:  03/13/2013 chest radiograph. FINDINGS: Stable cardiomediastinal silhouette with normal heart size. No pneumothorax. No pleural effusion. Mild left basilar atelectasis. Otherwise clear lungs with no pulmonary edema. No displaced fractures in the visualized chest. IMPRESSION: Mild left basilar atelectasis. Electronically Signed   By: Ilona Sorrel M.D.   On: 05/17/2021 19:26   DG Lumbar Spine Complete  Result Date: 05/17/2021 CLINICAL DATA:  Fall yesterday, low back pain EXAM: LUMBAR SPINE - COMPLETE 4+ VIEW COMPARISON:  None. FINDINGS: This report assumes 5 non rib-bearing lumbar vertebrae. Lumbar vertebral body heights are preserved, with no fracture. Mild multilevel lumbar degenerative disc disease, most prominent at L3-4. Mild 4 mm anterolisthesis at L4-5. Mild bilateral lower lumbar facet arthropathy. No aggressive appearing focal osseous lesions. Coarse calcifications in the deep pelvis, potentially small calcified uterine fibroids. IMPRESSION: 1. No lumbar spine fracture. 2. Mild multilevel lumbar degenerative disc disease, most prominent at L3-4. 3. Mild 4 mm anterolisthesis at L4-5. Electronically Signed   By: Ilona Sorrel M.D.   On: 05/17/2021 19:29   DG Knee 1-2 Views Left  Result Date: 05/17/2021 CLINICAL DATA:  Left knee pain following a fall. EXAM: LEFT KNEE - 1-2 VIEW COMPARISON:  None. FINDINGS: In moderate anterior patellar enthesophyte fat at the site of quadriceps tendon insertion. No fracture, dislocation or effusion seen. IMPRESSION: No fracture or effusion. Electronically Signed    By: Claudie Revering M.D.   On: 05/17/2021 19:29   DG Knee 1-2 Views Right  Result Date: 05/17/2021 CLINICAL DATA:  Fall yesterday, right knee pain EXAM: RIGHT KNEE - 1-2 VIEW COMPARISON:  None. FINDINGS: No fracture, joint effusion or dislocation. Small superior right patellar enthesophyte. No focal osseous lesions. No radiopaque foreign bodies. No significant degenerative arthropathy. IMPRESSION: No right knee fracture, joint effusion or malalignment. Small superior right patellar enthesophyte. Electronically Signed   By: Ilona Sorrel M.D.   On: 05/17/2021 19:30   CT Head Wo Contrast  Result Date: 05/17/2021 CLINICAL DATA:  Status post fall. EXAM: CT HEAD WITHOUT CONTRAST TECHNIQUE: Contiguous axial images were obtained from the base of the skull through the vertex without intravenous contrast. COMPARISON:  Apr 20, 2008 FINDINGS: Brain: There is mild cerebral atrophy with widening of the extra-axial spaces and ventricular dilatation. There are areas of decreased attenuation within the white matter tracts of the supratentorial brain, consistent with microvascular disease changes. Small chronic left basal ganglia lacunar infarcts are seen. Vascular: No hyperdense vessel or unexpected calcification. Skull: Normal. Negative for fracture or focal lesion. Sinuses/Orbits: No acute finding. Other: Very mild left frontal scalp soft tissue swelling is noted. IMPRESSION: 1. Generalized cerebral atrophy. 2. No acute intracranial abnormality. Electronically Signed   By: Virgina Norfolk M.D.   On: 05/17/2021 19:41   CT Cervical Spine Wo Contrast  Result Date: 05/17/2021 CLINICAL DATA:  Generalized weakness.  Fall yesterday. EXAM: CT CERVICAL SPINE WITHOUT CONTRAST TECHNIQUE: Multidetector CT imaging of the cervical spine was performed without intravenous contrast. Multiplanar CT image reconstructions were also generated. COMPARISON:  None. FINDINGS: Alignment: Patient is tilted. No traumatic subluxation. Mild  straightening of normal lordosis. Skull base and vertebrae: No acute fracture. Vertebral body heights are maintained. The dens and skull base are intact. Soft tissues and spinal canal: No prevertebral fluid or swelling. No visible canal hematoma. Disc levels: Degenerative disc disease at multiple levels most prominently affecting C5-C6 and C6-C7. Scattered facet hypertrophy. No high-grade bony canal stenosis. Upper chest: No acute findings.  Emphysema. Other: None. IMPRESSION: Degenerative change  in the cervical spine without acute fracture or subluxation. Electronically Signed   By: Keith Rake M.D.   On: 05/17/2021 19:42   US Abdomen Limited  Result Date: 05/17/2021 CLINICAL DATA:  Right upper quadrant abdominal pain, elevated liver function tests EXAM: ULTRASOUND ABDOMEN LIMITED RIGHT UPPER QUADRANT COMPARISON:  None. FINDINGS: Gallbladder: No gallstones or wall thickening visualized. No sonographic Murphy sign noted by sonographer. Common bile duct: Diameter: 6-7 mm in proximal diameter Liver: No focal lesion identified. Within normal limits in parenchymal echogenicity. Portal vein is patent on color Doppler imaging with normal direction of blood flow towards the liver. Other: None. IMPRESSION: Normal right upper quadrant sonogram Electronically Signed   By: Fidela Salisbury MD   On: 05/17/2021 21:32   DG Hip Unilat W or Wo Pelvis 2-3 Views Left  Result Date: 05/17/2021 CLINICAL DATA:  Fall.  Weakness. EXAM: DG HIP (WITH OR WITHOUT PELVIS) 2-3V LEFT COMPARISON:  None. FINDINGS: There is no evidence of hip fracture or dislocation. Mild bilateral hip osteoarthritis. Several calcified fibroids identified within the pelvis. IMPRESSION: 1. No acute findings. 2. Mild bilateral hip osteoarthritis. Electronically Signed   By: Kerby Moors M.D.   On: 05/17/2021 19:27         Scheduled Meds:  amLODipine  5 mg Oral Daily   aspirin EC  81 mg Oral Daily   exemestane  25 mg Oral Daily   heparin injection  (subcutaneous)  5,000 Units Subcutaneous Q8H   isosorbide mononitrate  30 mg Oral Daily   metoprolol succinate  50 mg Oral Daily   pantoprazole  40 mg Oral Daily   polyethylene glycol  17 g Oral Daily   potassium chloride SA  20 mEq Oral BID   predniSONE  5 mg Oral Daily   Continuous Infusions:     LOS: 2 days    Time spent: 31mins    Kathie Dike, MD [facility]   If 7PM-7AM, please contact night-coverage [email or web address]  05/19/2021, 12:49 PM  \

## 2021-05-19 NOTE — Evaluation (Signed)
Physical Therapy Evaluation Patient Details Name: [name] MRN: [id number] DOB: 08/10/1946 Today's Date: 05/19/2021   History of Present Illness  The pt is a 75 yo female presenting 6/7 after sustaining a fall at home. Work-up revealed AKI, rhabdomyolysis and electrolyte abnormalities including hypokalemia and hyponatremia. No acute injury as result of fall. PMH includes: arthritis, CAD, GERD, HTN, MI, and lupus.   Clinical Impression  Pt in bed upon arrival of PT, agreeable to evaluation at this time. Prior to admission the pt was independent with mobility without use of AD, living alone in a home with 4 STE and 16 steps to reach her bedroom. The pt  was able to complete all ADLs and IADLs independently. The pt now presents with limitations in functional mobility, strength, dynamic stability, activity tolerance, and safety awareness due to above dx, and will continue to benefit from skilled PT to address these deficits. The pt was able to complete bed mobility without physical assist, but requires heavy use of bed rail and elevated HOB to complete with increased time. The pt was able to complete short bout of hallway ambulation with minA and RW to maintain balance, but demos increased sway and short strides, needing minA to steady intermittently. The pt will continue to benefit from skilled PT acutely as well as short stint of SNF rehab to facilitate return to full independence.      Follow Up Recommendations SNF;Supervision/Assistance - 24 hour    Equipment Recommendations  Rolling walker with 5" wheels;3in1 (PT)    Recommendations for Other Services       Precautions / Restrictions Precautions Precautions: Fall Precaution Comments: pt admitted s/p fall Restrictions Weight Bearing Restrictions: No      Mobility  Bed Mobility Overal bed mobility: Needs Assistance Bed Mobility: Supine to Sit;Sit to Supine     Supine to sit: Min assist;HOB elevated Sit to supine: Min  assist;HOB elevated   General bed mobility comments: minA to reach EOB, increased time, effort, use of rails, and cues for sequencing    Transfers Overall transfer level: Needs assistance Equipment used: Rolling walker (2 wheeled) Transfers: Sit to/from Stand Sit to Stand: Min assist         General transfer comment: minA to steady, pt able to power up with increased time and use of momentum. max verbal cues for hand positioning, pt needing tactile cues to make adjustments  Ambulation/Gait Ambulation/Gait assistance: Min assist Gait Distance (Feet): 60 Feet Assistive device: Rolling walker (2 wheeled) Gait Pattern/deviations: Step-through pattern;Decreased stride length;Drifts right/left;Trunk flexed Gait velocity: decreased Gait velocity interpretation: <1.31 ft/sec, indicative of household ambulator General Gait Details: pt with short strides, minimal step length bilaterally, heavy reliance on UE, trunk flexed, cues for upright posture and positioning in RW     Balance Overall balance assessment: Needs assistance Sitting-balance support: No upper extremity supported;Feet supported Sitting balance-Leahy Scale: Fair Sitting balance - Comments: static sitting without UE support   Standing balance support: Bilateral upper extremity supported Standing balance-Leahy Scale: Poor Standing balance comment: reliant on UE support                             Pertinent Vitals/Pain Pain Assessment: No/denies pain    Home Living Family/patient expects to be discharged to:: Private residence Living Arrangements: Alone   Type of Home: House Home Access: Stairs to enter Entrance Stairs-Rails: Psychiatric nurse of Steps: 4 Home Layout: Multi-level Home Equipment: Grab bars -  tub/shower      Prior Function Level of Independence: Independent         Comments: independent without AD, still driving, living alone     Hand Dominance   Dominant Hand:  Right    Extremity/Trunk Assessment   Upper Extremity Assessment Upper Extremity Assessment: Generalized weakness    Lower Extremity Assessment Lower Extremity Assessment: Generalized weakness;LLE deficits/detail LLE Deficits / Details: pt denies numbness, weakness, or pain in LLE that limited MMT, but was grossly 4-/5 at ankle, knee, and hip. LLE Sensation: WNL    Cervical / Trunk Assessment Cervical / Trunk Assessment: Kyphotic  Communication   Communication: No difficulties  Cognition Arousal/Alertness: Awake/alert Behavior During Therapy: WFL for tasks assessed/performed Overall Cognitive Status: Impaired/Different from baseline Area of Impairment: Safety/judgement;Awareness;Problem solving;Memory                     Memory: Decreased short-term memory   Safety/Judgement: Decreased awareness of safety;Decreased awareness of deficits Awareness: Intellectual Problem Solving: Slow processing;Decreased initiation;Requires verbal cues General Comments: pt needing increased cues for safety, problem solving, and max re-orientation for need for assistance given current deficits.      General Comments General comments (skin integrity, edema, etc.): VSS, pt sister arrived at end of session, in agreement with plan for rehab     PT Assessment Patient needs continued PT services  PT Problem List Decreased strength;Decreased range of motion;Decreased activity tolerance;Decreased balance;Decreased mobility;Decreased cognition;Decreased safety awareness       PT Treatment Interventions DME instruction;Gait training;Stair training;Functional mobility training;Therapeutic activities;Therapeutic exercise;Balance training;Patient/family education    PT Goals (Current goals can be found in the Care Plan section)  Acute Rehab PT Goals Patient Stated Goal: return home to be independent PT Goal Formulation: With patient Time For Goal Achievement: 06/02/21 Potential to Achieve  Goals: Good    Frequency Min 2X/week   Barriers to discharge Decreased caregiver support pt from home alone       AM-PAC PT "6 Clicks" Mobility  Outcome Measure Help needed turning from your back to your side while in a flat bed without using bedrails?: A Little Help needed moving from lying on your back to sitting on the side of a flat bed without using bedrails?: A Little Help needed moving to and from a bed to a chair (including a wheelchair)?: A Little Help needed standing up from a chair using your arms (e.g., wheelchair or bedside chair)?: A Little Help needed to walk in hospital room?: A Little Help needed climbing 3-5 steps with a railing? : A Lot 6 Click Score: 17    End of Session Equipment Utilized During Treatment: Gait belt Activity Tolerance: Patient tolerated treatment well Patient left: in chair;with call bell/phone within reach;with family/visitor present Nurse Communication: Mobility status PT Visit Diagnosis: Unsteadiness on feet (R26.81);Muscle weakness (generalized) (M62.81);History of falling (Z91.81)    Time: 0932-6712 PT Time Calculation (min) (ACUTE ONLY): 31 min   Charges:   PT Evaluation $PT Eval Low Complexity: 1 Low PT Treatments $Gait Training: 8-22 mins        Karma Ganja, PT, DPT   Acute Rehabilitation Department Pager #: [phone]  Otho Bellows 05/19/2021, 3:06 PM

## 2021-05-19 NOTE — Evaluation (Signed)
Occupational Therapy Evaluation Patient Details Name: [name] MRN: [id number] DOB: Sep 21, 1946 Today's Date: 05/19/2021    History of Present Illness The pt is a 75 yo female presenting 6/7 after sustaining a fall at home. Work-up revealed AKI, rhabdomyolysis and electrolyte abnormalities including hypokalemia and hyponatremia. No acute injury as result of fall. PMH includes: arthritis, CAD, GERD, HTN, MI, and lupus.   Clinical Impression   Patient admitted for the diagnosis above.  PTA she was living alone in a two story townhouse alone, with PRN checks from family.  The patient was completing her own AD/IADL, med management and continued to drive.  Barriers are listed below.  Currently she is needing up to Min A for basic mobility and ADL completion at a RW level.   Of note, patient scored 8/28 on the short blessed indicating cognitive impairment.  Her sister confirms that she is having increasing difficulties with problem solving and complex thought at home.  Pill Box test may be a good next step, as she manages her own meds.  Given that she lives alone with only occasional assist, and that currently she needs 24 hour assist, SNF for post acute rehab is recommended.  OT will follow in the acute setting to maximize functional status.      Follow Up Recommendations  SNF    Equipment Recommendations  3 in 1 bedside commode    Recommendations for Other Services       Precautions / Restrictions Precautions Precautions: Fall Precaution Comments: pt admitted s/p fall Restrictions Weight Bearing Restrictions: No      Mobility Bed Mobility Overal bed mobility: Needs Assistance Bed Mobility: Supine to Sit;Sit to Supine     Supine to sit: Min assist;HOB elevated Sit to supine: Min assist;HOB elevated   General bed mobility comments: up in recliner Patient Response: Cooperative  Transfers Overall transfer level: Needs assistance Equipment used: Rolling walker (2  wheeled) Transfers: Sit to/from Stand Sit to Stand: Min guard            Balance Overall balance assessment: Needs assistance Sitting-balance support: No upper extremity supported;Feet supported Sitting balance-Leahy Scale: Fair Sitting balance - Comments: static sitting without UE support   Standing balance support: Bilateral upper extremity supported Standing balance-Leahy Scale: Poor Standing balance comment: reliant on UE support                           ADL either performed or assessed with clinical judgement   ADL Overall ADL's : Needs assistance/impaired Eating/Feeding: Independent;Sitting   Grooming: Wash/dry hands;Min guard;Standing           Upper Body Dressing : Min guard;Standing   Lower Body Dressing: Minimal assistance;Sit to/from stand Lower Body Dressing Details (indicate cue type and reason): verbal cuse for safety and hand placement prior to standing. Toilet Transfer: Public affairs consultant and Hygiene: Supervision/safety;Sit to/from stand               Vision Baseline Vision/History: Cataracts Patient Visual Report: No change from baseline       Perception     Praxis      Pertinent Vitals/Pain Pain Assessment: No/denies pain     Hand Dominance Right   Extremity/Trunk Assessment Upper Extremity Assessment Upper Extremity Assessment: Generalized weakness   Lower Extremity Assessment Lower Extremity Assessment: Defer to PT evaluation LLE Deficits / Details: pt denies numbness, weakness, or pain in LLE that limited MMT, but was  grossly 4-/5 at ankle, knee, and hip. LLE Sensation: WNL   Cervical / Trunk Assessment Cervical / Trunk Assessment: Kyphotic   Communication Communication Communication: No difficulties   Cognition Arousal/Alertness: Awake/alert Behavior During Therapy: WFL for tasks assessed/performed Overall Cognitive Status: History of cognitive impairments - at  baseline Area of Impairment: Safety/judgement;Awareness;Problem solving;Memory                     Memory: Decreased short-term memory   Safety/Judgement: Decreased awareness of safety;Decreased awareness of deficits Awareness: Emergent Problem Solving: Requires verbal cues;Difficulty sequencing General Comments: patient scored 9/28 on the short blessed indicating questionable impairment, further testing for dementing disorder is advised.  Pill box would be a good test.  Sister confirms noticable decline is cognition at home.  easily distracted.   General Comments      Exercises     Shoulder Instructions      Home Living Family/patient expects to be discharged to:: Private residence Living Arrangements: Alone Available Help at Discharge: Family;Available PRN/intermittently Type of Home:  (Townhome) Home Access: Stairs to enter [employer] of Steps: 4 Entrance Stairs-Rails: Right;Left Home Layout: Multi-level Alternate Level Stairs-Number of Steps: 16 Alternate Level Stairs-Rails: Right Bathroom Shower/Tub: Occupational psychologist: Handicapped height     Home Equipment: Grab bars - tub/shower          Prior Functioning/Environment Level of Independence: Independent        Comments: independent without AD, still driving, living alone.  handles bill payment and meds.        OT Problem List: Decreased strength;Decreased activity tolerance;Impaired balance (sitting and/or standing);Decreased safety awareness;Decreased cognition      OT Treatment/Interventions: Self-care/ADL training;Therapeutic exercise;DME and/or AE instruction;Balance training;Cognitive remediation/compensation;Therapeutic activities    OT Goals(Current goals can be found in the care plan section) Acute Rehab OT Goals Patient Stated Goal: return home when I'm better OT Goal Formulation: With patient Time For Goal Achievement: 06/02/21 Potential to Achieve Goals:  Good ADL Goals Pt Will Perform Grooming: with modified independence;standing;sitting Pt Will Perform Lower Body Bathing: with mod assist;sit to/from stand Pt Will Perform Lower Body Dressing: with modified independence;sit to/from stand Pt Will Transfer to Toilet: with modified independence;ambulating;regular height toilet  OT Frequency: Min 2X/week   Barriers to D/C:    none noted       Co-evaluation              AM-PAC OT "6 Clicks" Daily Activity     Outcome Measure Help from another person eating meals?: None Help from another person taking care of personal grooming?: None Help from another person toileting, which includes using toliet, bedpan, or urinal?: A Little Help from another person bathing (including washing, rinsing, drying)?: A Little Help from another person to put on and taking off regular upper body clothing?: A Little Help from another person to put on and taking off regular lower body clothing?: A Little 6 Click Score: 20   End of Session Equipment Utilized During Treatment: Rolling walker Nurse Communication: Mobility status  Activity Tolerance: Patient tolerated treatment well Patient left: in chair;with call bell/phone within reach;with family/visitor present  OT Visit Diagnosis: Unsteadiness on feet (R26.81);Muscle weakness (generalized) (M62.81);History of falling (Z91.81);Other symptoms and signs involving cognitive function                Time: 7353-2992 OT Time Calculation (min): 25 min Charges:  OT General Charges $OT Visit: 1 Visit OT Evaluation $OT Eval Moderate Complexity: 1  Mod OT Treatments $Self Care/Home Management : 8-22 mins  05/19/2021  Rich, OTR/L  Acute Rehabilitation Services  Office:  [place] 05/19/2021, 4:43 PM

## 2021-05-20 LAB — COMPREHENSIVE METABOLIC PANEL
ALT: 87 U/L — ABNORMAL HIGH (ref 0–44)
AST: 110 U/L — ABNORMAL HIGH (ref 15–41)
Albumin: 2.1 g/dL — ABNORMAL LOW (ref 3.5–5.0)
Alkaline Phosphatase: 149 U/L — ABNORMAL HIGH (ref 38–126)
Anion gap: 9 (ref 5–15)
BUN: 12 mg/dL (ref 8–23)
CO2: 20 mmol/L — ABNORMAL LOW (ref 22–32)
Calcium: 9 mg/dL (ref 8.9–10.3)
Chloride: 104 mmol/L (ref 98–111)
Creatinine, Ser: 0.7 mg/dL (ref 0.44–1.00)
GFR, Estimated: >60 mL/min (ref >60)
Glucose, Bld: 114 mg/dL — ABNORMAL HIGH (ref 70–99)
Potassium: 4.1 mmol/L (ref 3.5–5.1)
Sodium: 133 mmol/L — ABNORMAL LOW (ref 135–145)
Total Bilirubin: 0.8 mg/dL (ref 0.3–1.2)
Total Protein: 5.6 g/dL — ABNORMAL LOW (ref 6.5–8.1)

## 2021-05-20 LAB — CBC
HCT: 35.3 % — ABNORMAL LOW (ref 36.0–46.0)
Hemoglobin: 12.2 g/dL (ref 12.0–15.0)
MCH: 31.4 pg (ref 26.0–34.0)
MCHC: 34.6 g/dL (ref 30.0–36.0)
MCV: 91 fL (ref 80.0–100.0)
Platelets: 338 10*3/uL (ref 150–400)
RBC: 3.88 MIL/uL (ref 3.87–5.11)
RDW: 13.6 % (ref 11.5–15.5)
WBC: 7.1 10*3/uL (ref 4.0–10.5)
nRBC: 0 % (ref 0.0–0.2)

## 2021-05-20 LAB — MAGNESIUM: Magnesium: 1.8 mg/dL (ref 1.7–2.4)

## 2021-05-20 LAB — PHOSPHORUS: Phosphorus: 1.1 mg/dL — ABNORMAL LOW (ref 2.5–4.6)

## 2021-05-20 LAB — CK: Total CK: 507 U/L — ABNORMAL HIGH (ref 38–234)

## 2021-05-20 MED ORDER — SODIUM PHOSPHATES 45 MMOLE/15ML IV SOLN
30.0000 mmol | Freq: Once | INTRAVENOUS | Status: AC
  Administered 2021-05-20: 30 mmol via INTRAVENOUS
  Filled 2021-05-20: qty 10

## 2021-05-20 MED ORDER — ADULT MULTIVITAMIN W/MINERALS CH
1.0000 | ORAL_TABLET | Freq: Every day | ORAL | Status: DC
  Administered 2021-05-20 – 2021-05-24 (×5): 1 via ORAL
  Filled 2021-05-20 (×5): qty 1

## 2021-05-20 MED ORDER — SODIUM CHLORIDE 0.9 % IV SOLN: INTRAVENOUS | Status: DC

## 2021-05-20 MED ORDER — POTASSIUM PHOSPHATES 15 MMOLE/5ML IV SOLN: 20.0000 mmol | Freq: Once | INTRAVENOUS | Status: DC

## 2021-05-20 NOTE — TOC Initial Note (Addendum)
Transition of Care [facility]) - Initial/Assessment Note    Patient Details  Name: [name] MRN: [id number] Date of Birth: 1946/10/29  [facility] CM/SW Contact:    Marilu Favre, RN Phone Number: 05/20/2021, 11:41 AM  Clinical Narrative:                 Spoke to patient at bedside. Patient from home alone. Confirmed face sheet information.   Discussed PT recommendation for short term rehab at SNF ,patient declining she prefers  going home at discharge.   Discussed HHPT with 24 hour assistance, patient voiced understanding but does not feel that she needs 24 assitance. She will discuss with her sister. Patient is aware home health will only be in the home a couple times a week for hour at a time.   Tommi Rumps with [facility] accepted referral for HHRN,PT,OT,SW and aide   Called for walker and 3 in 1 with [place]   Expected Discharge Plan: [place]     Patient Goals and CMS Choice Patient states their goals for this hospitalization and ongoing recovery are:: to return to home CMS Medicare.gov Compare Post Acute Care list provided to:: Patient Choice offered to / list presented to : Patient  Expected Discharge Plan and Services Expected Discharge Plan: [place]   Discharge Planning Services: CM Consult Post Acute Care Choice: Home Health, Durable Medical Equipment Living arrangements for the past 2 months: Apartment                 DME Arranged: 3-N-1, Walker rolling DME Agency: [employer] Date DME Agency Contacted: 05/20/21 Time DME Agency Contacted: 25 Representative spoke with at DME Agency: Freda Munro HH Arranged: RN, PT, Nurse's Aide, Social Work, OT          Prior Living Arrangements/Services Living arrangements for the past 2 months: [place] with:: Self Patient language and need for interpreter reviewed:: Yes Do you feel safe going back to the place where you live?: Yes      Need for Family  Participation in Patient Care: Yes (Comment) Care giver support system in place?: Yes (comment)   Criminal Activity/Legal Involvement Pertinent to Current Situation/Hospitalization: No - Comment as needed  Activities of Daily Living      Permission Sought/Granted   Permission granted to share information with : No              Emotional Assessment Appearance:: Appears stated age Attitude/Demeanor/Rapport: Engaged Affect (typically observed): Accepting Orientation: : Oriented to Self, Oriented to Place, Oriented to  Time, Oriented to Situation Alcohol / Substance Use: Not Applicable Psych Involvement: No (comment)  Admission diagnosis:  Hyponatremia [E87.1] Fall [W19.XXXA] AKI (acute kidney injury) ([place]) [N17.9] Non-traumatic rhabdomyolysis [M62.82] Leukocytosis, unspecified type [D72.829] Patient Active Problem List   Diagnosis Date Noted   Traumatic rhabdomyolysis ([place]) 05/18/2021   Hypokalemia 05/18/2021   Hyponatremia 05/18/2021   Fall at home, initial encounter 05/18/2021   SIRS (systemic inflammatory response syndrome) ([place]) 05/18/2021   AKI (acute kidney injury) ([place]) 05/17/2021   Iron deficiency anemia 03/01/2017   Menopausal hot flushes 09/22/2016   Breast cancer of upper-outer quadrant of right female breast ([place]) 05/28/2014   Abnormal mammogram with microcalcification 04/14/2014   Essential hypertension 01/20/2013   Persistent disorder of initiating or maintaining sleep 04/03/2012   PCP:  Charolette Forward, MD Pharmacy:   [id number] - [place], [place]  [address] Phone: [phone] Fax: [phone]     Social Determinants of Health (SDOH) Interventions    Readmission Risk Interventions No flowsheet data found.

## 2021-05-20 NOTE — Progress Notes (Signed)
Initial Nutrition Assessment  DOCUMENTATION CODES:  Not applicable  INTERVENTION:  Add Magic cup TID with meals, each supplement provides 290 kcal and 9 grams of protein.  Add MVI with minerals daily.  NUTRITION DIAGNOSIS:  Increased nutrient needs related to acute illness (AKI) as evidenced by estimated needs.  GOAL:  Patient will meet greater than or equal to 90% of their needs  MONITOR:  PO intake, Supplement acceptance, Labs, Weight trends, I & O's  REASON FOR ASSESSMENT:  Consult Assessment of nutrition requirement/status  ASSESSMENT:  75 yo female with a PMH of HTN, MI, asthma, arthritis, emphysema, CAD, GERD, lupus, and breast cancer who presents with AKI and electrolyte imbalance.  Spoke with pt at bedside. Pt reports that she is eating well at home and she complimented the food here as well. Per Epic, her intake is variable between 30-80%. She was about to order dinner as RD left.  She denies any weight changes. Pt has lost 5.3% (10 lbs) in the last year, which is not significant for the time frame.  On exam, pt with some mild depletions likely from age and not poor intake.  Recommend adding Magic Cup TID and MVI with minerals to promote intake.  NUTRITION - FOCUSED PHYSICAL EXAM: Flowsheet Row Most Recent Value  Orbital Region Mild depletion  Upper Arm Region No depletion  Thoracic and Lumbar Region No depletion  Buccal Region Mild depletion  Temple Region Mild depletion  Clavicle Bone Region No depletion  Clavicle and Acromion Bone Region No depletion  Scapular Bone Region No depletion  Dorsal Hand No depletion  Patellar Region No depletion  Anterior Thigh Region No depletion  Posterior Calf Region No depletion  Edema (RD Assessment) None  Hair Reviewed  Eyes Reviewed  Mouth Reviewed  Skin Reviewed  Nails Reviewed   Diet Order:   Diet Order             Diet Heart Room service appropriate? Yes; Fluid consistency: Thin  Diet effective now                   EDUCATION NEEDS:  Education needs have been addressed  Skin:  Skin Assessment: Reviewed RN Assessment  Last BM:  05/20/21 - large  Height:  Ht Readings from Last 1 Encounters:  05/17/21 5\' 6"  (1.676 m)   Weight:  Wt Readings from Last 1 Encounters:  05/17/21 78 kg   Ideal Body Weight:  59.1 kg  BMI:  Body mass index is 27.76 kg/m.  Estimated Nutritional Needs:  Kcal:  1700-1900 Protein:  85-100 grams Fluid:  >1.7 L  Derrel Nip, RD, LDN Registered Dietitian I After-Hours/Weekend Pager # in [place]

## 2021-05-20 NOTE — Plan of Care (Signed)

## 2021-05-20 NOTE — Progress Notes (Addendum)
[name]  IWL:[id number] DOB: 09-02-1946 DOA: 05/17/2021 PCP: Charolette Forward, MD   Brief Narrative:  75 year old female who had fallen at home and was unable to get up for a prolonged period of time.  Presents with acute kidney injury, rhabdomyolysis and electrolyte abnormalities including hypokalemia and hyponatremia.  She admitted for further IV fluids and correction of electrolytes.  Assessment & Plan:  Acute kidney injury: Improving -Secondary to rhabdomyolysis in the setting of dehydration, ACE inhibitor use -creatinine 2.69 on admission improved to 0.7 this morning -Continue IV fluids.  Continue to hold ACE inhibitor's -Follow urine output   Rhabdomyolysis: -Secondary to fall and prolonged immobilization -Imaging did not indicate any bony injuries -CK 5703->2708->1137-> 507 -Continue IV fluids.  Repeat CK level tomorrow a.m.   SIRS -No clear source of infection has been identified.  She is afebrile.  Blood culture: NGTD -She was started on IV antibiotics on admission which was discontinued later on. -We will continue to monitor.  Elevated liver enzymes -Likely secondary to rhabdomyolysis.  Right upper quadrant ultrasound: Negative for acute findings. -Levels are improving -Repeat CMP tomorrow AM.  Continue to hold statin.  Hypophosphatemia: Replenished.  Repeat phosphorus level tomorrow a.m.  Hypertension -Holding ACE inhibitor -continue on amlodipine and Toprol   Hyponatremia -Suspect is related to hypovolemia -Improving -Hold Effexor for now   Prolonged QTC -Noted to have QTC of 541 on presentation, repeat EKG with QTc of 500 -Potassium, magnesium normal  GERD: Continue on PPI  Generalized weakness: -PT OT recommended SNF however patient refused to go to SNF. -Will arrange home health PT.  Chronic anxiety/depression: Continue home Xanax as needed.  Effexor is on hold due to hyponatremia.  Hypokalemia: Resolved  DVT  prophylaxis: Heparin Code Status: Full code Family Communication:  None present at bedside.  Plan of care discussed with patient in length and she verbalized understanding and agreed with it. Disposition Plan: Likely home tomorrow  Consultants:  None  Procedures:  None  Antimicrobials:  Vancomycin Cefepime  Status is: Inpatient   Dispo:  Patient From: Home  Planned Disposition: Home with Health Care Svc  Medically stable for discharge: No       Subjective: Patient seen and examined.  Resting comfortably on the bed.  Reports that she feels much better this morning.  She slept well.  Denies muscle ache.  Urinating well.  Had regular bowel movement this morning.  She noticed an episode of black stool on Wednesday.  Has history of benign colon polyps.  No hemorrhoids.  Denies headache, blurry vision, chest pain, shortness of breath, palpitation or leg swelling.  Objective: Vitals:   05/19/21 0840 05/19/21 1356 05/19/21 2017 05/20/21 0442  BP:  (!) 147/84 (!) 166/86 (!) 152/87  Pulse: (!) 59 77 80 71  Resp:  17 20 18   Temp:  (!) 97.5 F (36.4 C) 97.9 F (36.6 C) 97.6 F (36.4 C)  TempSrc:  Oral Oral Oral  SpO2:  100% 98% 98%  Weight:      Height:        Intake/Output Summary (Last 24 hours) at 05/20/2021 0804 Last data filed at 05/20/2021 0442 Gross per 24 hour  Intake 1850.13 ml  Output 300 ml  Net 1550.13 ml   Filed Weights   05/17/21 1614  Weight: 78 kg    Examination:  General exam: Appears calm and comfortable  Respiratory system: Clear to auscultation. Respiratory effort normal. Cardiovascular system: S1 & S2 heard, RRR.  No JVD, murmurs, rubs, gallops or clicks. No pedal edema. Gastrointestinal system: Abdomen is nondistended, soft and nontender. No organomegaly or masses felt. Normal bowel sounds heard. Central nervous system: Alert and oriented. No focal neurological deficits. Extremities: Symmetric 5 x 5 power. Skin: No rashes, lesions or  ulcers Psychiatry: Judgement and insight appear normal. Mood & affect appropriate.    Data Reviewed: I have personally reviewed following labs and imaging studies  CBC: Recent Labs  Lab 05/17/21 1802 05/18/21 0328 05/19/21 0021 05/20/21 0004  WBC 15.7* 11.6* 10.2 7.1  NEUTROABS 13.9*  --   --   --   HGB 14.3 12.9 11.1* 12.2  HCT 41.5 37.3 32.6* 35.3*  MCV 92.0 91.2 91.6 91.0  PLT 265 261 283 825   Basic Metabolic Panel: Recent Labs  Lab 05/17/21 1802 05/18/21 0034 05/18/21 0328 05/18/21 0810 05/18/21 1031 05/19/21 0021 05/20/21 0004  NA 124*   < > 125* 129* 130* 130* 133*  K 3.2*  --  2.6*  --  2.8* 3.9 4.1  CL 84*  --  87*  --   --  101 104  CO2 20*  --  21*  --   --  18* 20*  GLUCOSE 76  --  93  --   --  105* 114*  BUN 41*  --  40*  --   --  20 12  CREATININE 2.69*  --  2.02*  --   --  0.94 0.70  CALCIUM 8.5*  --  8.6*  --   --  8.4* 9.0  MG  --   --  1.9  --   --   --   --   PHOS  --   --  3.5  --   --   --  1.1*   < > = values in this interval not displayed.   GFR: Estimated Creatinine Clearance: 64.1 mL/min (by C-G formula based on SCr of 0.7 mg/dL). Liver Function Tests: Recent Labs  Lab 05/17/21 1802 05/18/21 0328 05/19/21 0021 05/20/21 0004  AST 381* 301* 164* 110*  ALT 146* 124* 98* 87*  ALKPHOS 199* 168* 151* 149*  BILITOT 1.8* 1.3* 1.1 0.8  PROT 6.7 5.8* 5.4* 5.6*  ALBUMIN 2.6* 2.2* 1.9* 2.1*   No results for input(s): LIPASE, AMYLASE in the last 168 hours. Recent Labs  Lab 05/17/21 1802  AMMONIA 26   Coagulation Profile: Recent Labs  Lab 05/17/21 2152  INR 1.6*   Cardiac Enzymes: Recent Labs  Lab 05/17/21 1802 05/18/21 0231 05/19/21 0021 05/20/21 0004  CKTOTAL 5,703* 2,708* 1,137* 507*   BNP (last 3 results) No results for input(s): PROBNP in the last 8760 hours. HbA1C: No results for input(s): HGBA1C in the last 72 hours. CBG: No results for input(s): GLUCAP in the last 168 hours. Lipid Profile: No results for  input(s): CHOL, HDL, LDLCALC, TRIG, CHOLHDL, LDLDIRECT in the last 72 hours. Thyroid Function Tests: No results for input(s): TSH, T4TOTAL, FREET4, T3FREE, THYROIDAB in the last 72 hours. Anemia Panel: No results for input(s): VITAMINB12, FOLATE, FERRITIN, TIBC, IRON, RETICCTPCT in the last 72 hours. Sepsis Labs: Recent Labs  Lab 05/17/21 1802 05/18/21 0327  LATICACIDVEN 2.2*  2.0* 1.2    Recent Results (from the past 240 hour(s))  Resp Panel by RT-PCR (Flu A&B, Covid) Nasopharyngeal Swab     Status: None   Collection Time: 05/17/21  6:07 PM   Specimen: Nasopharyngeal Swab; Nasopharyngeal(NP) swabs in vial transport medium  Result Value Ref Range  Status   SARS Coronavirus 2 by RT PCR NEGATIVE NEGATIVE Final    Comment: (NOTE) SARS-CoV-2 target nucleic acids are NOT DETECTED.  The SARS-CoV-2 RNA is generally detectable in upper respiratory specimens during the acute phase of infection. The lowest concentration of SARS-CoV-2 viral copies this assay can detect is 138 copies/mL. A negative result does not preclude SARS-Cov-2 infection and should not be used as the sole basis for treatment or other patient management decisions. A negative result may occur with  improper specimen collection/handling, submission of specimen other than nasopharyngeal swab, presence of viral mutation(s) within the areas targeted by this assay, and inadequate number of viral copies(<138 copies/mL). A negative result must be combined with clinical observations, patient history, and epidemiological information. The expected result is Negative.  Fact Sheet for Patients:  EntrepreneurPulse.com.au  Fact Sheet for Healthcare Providers:  IncredibleEmployment.be  This test is no t yet approved or cleared by the [place] FDA and  has been authorized for detection and/or diagnosis of SARS-CoV-2 by FDA under an Emergency Use Authorization (EUA). This EUA will remain  in  effect (meaning this test can be used) for the duration of the COVID-19 declaration under Section 564(b)(1) of the Act, 21 U.S.C.section 360bbb-3(b)(1), unless the authorization is terminated  or revoked sooner.       Influenza A by PCR NEGATIVE NEGATIVE Final   Influenza B by PCR NEGATIVE NEGATIVE Final    Comment: (NOTE) The Xpert Xpress SARS-CoV-2/FLU/RSV plus assay is intended as an aid in the diagnosis of influenza from Nasopharyngeal swab specimens and should not be used as a sole basis for treatment. Nasal washings and aspirates are unacceptable for Xpert Xpress SARS-CoV-2/FLU/RSV testing.  Fact Sheet for Patients: EntrepreneurPulse.com.au  Fact Sheet for Healthcare Providers: IncredibleEmployment.be  This test is not yet approved or cleared by the [place] FDA and has been authorized for detection and/or diagnosis of SARS-CoV-2 by FDA under an Emergency Use Authorization (EUA). This EUA will remain in effect (meaning this test can be used) for the duration of the COVID-19 declaration under Section 564(b)(1) of the Act, 21 U.S.C. section 360bbb-3(b)(1), unless the authorization is terminated or revoked.  Performed at [facility], [place] [address], [place], [place] [address]   Urine culture     Status: None   Collection Time: 05/17/21  6:15 PM   Specimen: In/Out Cath Urine  Result Value Ref Range Status   Specimen Description IN/OUT CATH URINE  Final   Special Requests NONE  Final   Culture   Final    NO GROWTH Performed at [facility], [place] [address] [place], [place] [address]    Report Status 05/19/2021 FINAL  Final  Blood culture (routine x 2)     Status: None (Preliminary result)   Collection Time: 05/17/21  7:22 PM   Specimen: BLOOD  Result Value Ref Range Status   Specimen Description BLOOD SITE NOT SPECIFIED  Final   Special Requests   Final    BOTTLES DRAWN AEROBIC AND ANAEROBIC Blood Culture adequate  volume   Culture   Final    NO GROWTH 2 DAYS Performed at [facility], [place] [address] [place], [place] [address]    Report Status PENDING  Incomplete      Radiology Studies: No results found.  Scheduled Meds:  amLODipine  5 mg Oral Daily   aspirin EC  81 mg Oral Daily   exemestane  25 mg Oral Daily   heparin  injection (subcutaneous)  5,000 Units Subcutaneous Q8H   isosorbide mononitrate  30 mg Oral Daily   metoprolol succinate  50 mg Oral Daily   pantoprazole  40 mg Oral Daily   polyethylene glycol  17 g Oral Daily   predniSONE  5 mg Oral Daily   Continuous Infusions:  potassium PHOSPHATE IVPB (in mmol)       LOS: 3 days   Time spent: 40 minutes   Meriam Chojnowski Loann Quill, MD [facility]  If 7PM-7AM, please contact night-coverage [email or web address] 05/20/2021, 8:04 AM

## 2021-05-20 NOTE — Progress Notes (Signed)
Physical Therapy Treatment Patient Details Name: [name] MRN: [id number] DOB: 12/29/1945 Today's Date: 05/20/2021    History of Present Illness The pt is a 75 yo female presenting 6/7 after sustaining a fall at home. Work-up revealed AKI, rhabdomyolysis and electrolyte abnormalities including hypokalemia and hyponatremia. No acute injury as result of fall. PMH includes: arthritis, CAD, GERD, HTN, MI, and lupus.    PT Comments    Pt with good progress with mobility and is doing well with rolling walker. Pt is refusing SNF and plans to return home. Cognition will likely be biggest factor in her success at home.    Follow Up Recommendations  Home health PT;Supervision - Intermittent (pt refusing SNF)     Equipment Recommendations  Rolling walker with 5" wheels;3in1 (PT)    Recommendations for Other Services       Precautions / Restrictions Precautions Precautions: Fall    Mobility  Bed Mobility Overal bed mobility: Modified Independent Bed Mobility: Sit to Supine       Sit to supine: Modified independent (Device/Increase time)        Transfers Overall transfer level: Modified independent Equipment used: Rolling walker (2 wheeled) Transfers: Sit to/from Stand Sit to Stand: Modified independent (Device/Increase time)         General transfer comment: Stood from commode  Ambulation/Gait Ambulation/Gait assistance: Scientist, forensic (Feet): 170 Feet Assistive device: Rolling walker (2 wheeled) Gait Pattern/deviations: Step-through pattern;Decreased stride length;Trunk flexed Gait velocity: decreased Gait velocity interpretation: 1.31 - 2.62 ft/sec, indicative of limited community ambulator General Gait Details: supervision for lines. Cues to stand more erect   Stairs             Wheelchair Mobility    Modified Rankin (Stroke Patients Only)       Balance Overall balance assessment: Needs assistance Sitting-balance support: No upper  extremity supported;Feet supported Sitting balance-Leahy Scale: Good     Standing balance support: No upper extremity supported;During functional activity Standing balance-Leahy Scale: Fair Standing balance comment: Able to pull down and up panties without support. Able to stand and wash hands unsupported.                            Cognition Arousal/Alertness: Awake/alert Behavior During Therapy: WFL for tasks assessed/performed Overall Cognitive Status: No family/caregiver present to determine baseline cognitive functioning Area of Impairment: Safety/judgement;Memory                     Memory: Decreased short-term memory   Safety/Judgement: Decreased awareness of safety;Decreased awareness of deficits            Exercises      General Comments        Pertinent Vitals/Pain Pain Assessment: No/denies pain    Home Living                      Prior Function            PT Goals (current goals can now be found in the care plan section) Acute Rehab PT Goals Patient Stated Goal: go home Progress towards PT goals: Progressing toward goals    Frequency    Min 3X/week      PT Plan Discharge plan needs to be updated;Frequency needs to be updated    Co-evaluation              AM-PAC PT "6 Clicks" Mobility   Outcome Measure  Help needed turning from your back to your side while in a flat bed without using bedrails?: None Help needed moving from lying on your back to sitting on the side of a flat bed without using bedrails?: None Help needed moving to and from a bed to a chair (including a wheelchair)?: None Help needed standing up from a chair using your arms (e.g., wheelchair or bedside chair)?: None Help needed to walk in hospital room?: A Little Help needed climbing 3-5 steps with a railing? : A Little 6 Click Score: 22    End of Session   Activity Tolerance: Patient tolerated treatment well Patient left: with call  bell/phone within reach;in bed;with bed alarm set   PT Visit Diagnosis: Unsteadiness on feet (R26.81);Muscle weakness (generalized) (M62.81);History of falling (Z91.81)     Time: 2595-6387 PT Time Calculation (min) (ACUTE ONLY): 20 min  Charges:  $Gait Training: 8-22 mins                     [place] Pager [phone] Office [place] 05/20/2021, 4:59 PM

## 2021-05-21 DIAGNOSIS — E871 Hypo-osmolality and hyponatremia: Secondary | ICD-10-CM

## 2021-05-21 LAB — COMPREHENSIVE METABOLIC PANEL
ALT: 71 U/L — ABNORMAL HIGH (ref 0–44)
AST: 73 U/L — ABNORMAL HIGH (ref 15–41)
Albumin: 2 g/dL — ABNORMAL LOW (ref 3.5–5.0)
Alkaline Phosphatase: 120 U/L (ref 38–126)
Anion gap: 8 (ref 5–15)
BUN: 6 mg/dL — ABNORMAL LOW (ref 8–23)
CO2: 21 mmol/L — ABNORMAL LOW (ref 22–32)
Calcium: 8.6 mg/dL — ABNORMAL LOW (ref 8.9–10.3)
Chloride: 108 mmol/L (ref 98–111)
Creatinine, Ser: 0.57 mg/dL (ref 0.44–1.00)
GFR, Estimated: >60 mL/min (ref >60)
Glucose, Bld: 98 mg/dL (ref 70–99)
Potassium: 3.5 mmol/L (ref 3.5–5.1)
Sodium: 137 mmol/L (ref 135–145)
Total Bilirubin: 0.8 mg/dL (ref 0.3–1.2)
Total Protein: 5.2 g/dL — ABNORMAL LOW (ref 6.5–8.1)

## 2021-05-21 LAB — CK: Total CK: 229 U/L (ref 38–234)

## 2021-05-21 LAB — PHOSPHORUS: Phosphorus: 2.1 mg/dL — ABNORMAL LOW (ref 2.5–4.6)

## 2021-05-21 MED ORDER — RAMIPRIL 10 MG PO CAPS
10.0000 mg | ORAL_CAPSULE | Freq: Two times a day (BID) | ORAL | Status: DC
  Administered 2021-05-21 – 2021-05-24 (×7): 10 mg via ORAL
  Filled 2021-05-21 (×7): qty 1

## 2021-05-21 MED ORDER — ENOXAPARIN SODIUM 40 MG/0.4ML IJ SOSY
40.0000 mg | PREFILLED_SYRINGE | INTRAMUSCULAR | Status: DC
  Administered 2021-05-21 – 2021-05-22 (×2): 40 mg via SUBCUTANEOUS
  Filled 2021-05-21 (×2): qty 0.4

## 2021-05-21 MED ORDER — VENLAFAXINE HCL ER 37.5 MG PO CP24
37.5000 mg | ORAL_CAPSULE | Freq: Every day | ORAL | Status: DC
  Administered 2021-05-22 – 2021-05-24 (×3): 37.5 mg via ORAL
  Filled 2021-05-21 (×3): qty 1

## 2021-05-21 NOTE — Plan of Care (Signed)

## 2021-05-21 NOTE — Plan of Care (Signed)
  Problem: Education: Goal: Ability to demonstrate appropriate child care will improve Outcome: Progressing Goal: Ability to verbalize an understanding of newborn treatment and procedures will improve Outcome: Progressing Goal: Ability to demonstrate an understanding of appropriate nutrition and feeding will improve Outcome: Progressing Goal: Individualized Educational Video(s) Outcome: Progressing   Problem: Nutritional: Goal: Nutritional status of the infant will improve as evidenced by minimal weight loss and appropriate weight gain for gestational age Outcome: Progressing Goal: Ability to maintain a balanced intake and output will improve Outcome: Progressing   Problem: Clinical Measurements: Goal: Ability to maintain clinical measurements within normal limits will improve Outcome: Progressing   Problem: Skin Integrity: Goal: Risk for impaired skin integrity will decrease Outcome: Progressing Goal: Demonstrates signs of wound healing without infection Outcome: Progressing

## 2021-05-21 NOTE — TOC Progression Note (Signed)
Transition of Care [facility]) - Progression Note    Patient Details  Name: [name] MRN: [id number] Date of Birth: November 02, 1946  Transition of Care [facility]) CM/SW Contact  Joanne Chars, LCSW Phone Number: 05/21/2021, 1:32 PM  Clinical Narrative:   CSW messaged by MD that pt has changed mind and is asking for SNF.  CSW met with pt and confirmed this, choice document given to pt.  Pt reports she has been vaccinated for covid with 2 boosters.  Permission given to send pt out in the hub.     Expected Discharge Plan: [place]    Expected Discharge Plan and Services Expected Discharge Plan: [place]   Discharge Planning Services: CM Consult Post Acute Care Choice: Home Health, Durable Medical Equipment Living arrangements for the past 2 months: Apartment Expected Discharge Date: 05/21/21               DME Arranged: Berta Minor rolling DME Agency: [employer] Date DME Agency Contacted: 05/20/21 Time DME Agency Contacted: 25 Representative spoke with at DME Agency: Freda Munro HH Arranged: RN, PT, Nurse's Aide, Social Work, OT           Social Determinants of Health ([place]) Interventions    Readmission Risk Interventions No flowsheet data found.

## 2021-05-21 NOTE — Progress Notes (Signed)
PROGRESS NOTE  [name]  BPZ:[id number] DOB: October 24, 1946 DOA: 05/17/2021 PCP: Charolette Forward, MD   Brief Narrative: [name] is a 75 y.o. female with a history of HTN, depression, GERD who presented to the ED 6/7 after falling and being unable to get up at home found to have rhabdomyolysis (CK 5,703) and AKI (Cr 2.69) with hyponatremia, hypokalemia, and elevated LFTs. She was admitted, given IV fluids with improvement in these parameters. She continues to be generally weak, with PT recommending SNF rehabilitation prior to returning home. The family and patient agree to this, so CSW is pursuing options.  Assessment & Plan: Active Problems:   Essential hypertension   AKI (acute kidney injury) ([place])   Traumatic rhabdomyolysis ([place])   Hypokalemia   Hyponatremia   Fall at home, initial encounter   SIRS (systemic inflammatory response syndrome) (HCC)  Rhabdomyolysis: Due to prolonged immobilization. CK has normalized as of 6/11.   AKI: SCr has normalized and patient is now taking adequate per oral intake to meet hydration and nutritional needs. We can stop IV fluids.   LFT elevation: Secondary to rhabdomyolysis. RUQ U/S without acute findings. Abd exam benign. LFTs continue improvement.  - Continue holding statin for now.   SIRS without source: Febrile with WBC 15.7k on arrival. Antibiotics stopped and no nidus of infection has presented itself after work up. Sepsis is ruled out.   HTN:  - Continue norvasc, metoprolol, holding ACE inhibitor. May restart depending on BP trends now that CrCl has normalized.   GERD: Continue PPI  Prolonged QT interval: Monitor Mg, K. Avoid provocative agents.   Hypokalemia: Resolved  Hyponatremia: Improving.  - Restart effexor  Hypophosphatemia: Improved.   HLD: Hold statin as above  Depression: Quiescent. Continue effexor, prn xanax  DVT prophylaxis: Lovenox Code Status: Full Family Communication: None at bedside Disposition Plan:   Status is: Inpatient  Remains inpatient appropriate because:Unsafe d/c plan  Dispo:  Patient From: Home Planned Disposition: SNF Medically stable for discharge: Yes  Consultants:  None  Procedures:  None  Antimicrobials: None   Subjective: Pt feels no pain, no dyspnea, making normal urine output, still slightly weak and amenable to continued rehabilitation at SNF as recommended by PT. Eating/drinking normally. No complaints.   Objective: Vitals:   05/20/21 0442 05/20/21 1519 05/20/21 1941 05/21/21 0450  BP: (!) 152/87 140/88 (!) 157/81 (!) 152/73  Pulse: 71 68 68 63  Resp: 18 14 16 16   Temp: 97.6 F (36.4 C) 97.8 F (36.6 C) (!) 97.5 F (36.4 C) (!) 97.5 F (36.4 C)  TempSrc: Oral Oral Oral Oral  SpO2: 98% 100% 100% 95%  Weight:      Height:        Intake/Output Summary (Last 24 hours) at 05/21/2021 1343 Last data filed at 05/21/2021 0515 Gross per 24 hour  Intake 777.23 ml  Output --  Net 777.23 ml   Filed Weights   05/17/21 1614  Weight: 78 kg    Gen: 75 y.o. female in no distress Pulm: Non-labored breathing room air. Clear to auscultation bilaterally.  CV: Regular rate and rhythm. No murmur, rub, or gallop. No JVD, no pedal edema. GI: Abdomen soft, non-tender, non-distended, with normoactive bowel sounds. No organomegaly or masses felt. Ext: Warm, no deformities Skin: No rashes, lesions or ulcers Neuro: Alert and oriented. No focal neurological deficits. Psych: Judgement and insight appear normal. Mood & affect appropriate.   Data Reviewed: I have personally reviewed following labs and imaging studies  CBC: Recent Labs  Lab 05/17/21 1802 05/18/21 0328 05/19/21 0021 05/20/21 0004  WBC 15.7* 11.6* 10.2 7.1  NEUTROABS 13.9*  --   --   --   HGB 14.3 12.9 11.1* 12.2  HCT 41.5 37.3 32.6* 35.3*  MCV 92.0 91.2 91.6 91.0  PLT 265 261 283 782   Basic Metabolic Panel: Recent Labs  Lab 05/17/21 1802 05/18/21 0034 05/18/21 0328 05/18/21 0810  05/18/21 1031 05/19/21 0021 05/20/21 0004 05/20/21 0752 05/21/21 0230  NA 124*   < > 125* 129* 130* 130* 133*  --  137  K 3.2*  --  2.6*  --  2.8* 3.9 4.1  --  3.5  CL 84*  --  87*  --   --  101 104  --  108  CO2 20*  --  21*  --   --  18* 20*  --  21*  GLUCOSE 76  --  93  --   --  105* 114*  --  98  BUN 41*  --  40*  --   --  20 12  --  6*  CREATININE 2.69*  --  2.02*  --   --  0.94 0.70  --  0.57  CALCIUM 8.5*  --  8.6*  --   --  8.4* 9.0  --  8.6*  MG  --   --  1.9  --   --   --   --  1.8  --   PHOS  --   --  3.5  --   --   --  1.1*  --  2.1*   < > = values in this interval not displayed.   GFR: Estimated Creatinine Clearance: 64.1 mL/min (by C-G formula based on SCr of 0.57 mg/dL). Liver Function Tests: Recent Labs  Lab 05/17/21 1802 05/18/21 0328 05/19/21 0021 05/20/21 0004 05/21/21 0230  AST 381* 301* 164* 110* 73*  ALT 146* 124* 98* 87* 71*  ALKPHOS 199* 168* 151* 149* 120  BILITOT 1.8* 1.3* 1.1 0.8 0.8  PROT 6.7 5.8* 5.4* 5.6* 5.2*  ALBUMIN 2.6* 2.2* 1.9* 2.1* 2.0*   No results for input(s): LIPASE, AMYLASE in the last 168 hours. Recent Labs  Lab 05/17/21 1802  AMMONIA 26   Coagulation Profile: Recent Labs  Lab 05/17/21 2152  INR 1.6*   Cardiac Enzymes: Recent Labs  Lab 05/17/21 1802 05/18/21 0231 05/19/21 0021 05/20/21 0004 05/21/21 0230  CKTOTAL 5,703* 2,708* 1,137* 507* 229   BNP (last 3 results) No results for input(s): PROBNP in the last 8760 hours. HbA1C: No results for input(s): HGBA1C in the last 72 hours. CBG: No results for input(s): GLUCAP in the last 168 hours. Lipid Profile: No results for input(s): CHOL, HDL, LDLCALC, TRIG, CHOLHDL, LDLDIRECT in the last 72 hours. Thyroid Function Tests: No results for input(s): TSH, T4TOTAL, FREET4, T3FREE, THYROIDAB in the last 72 hours. Anemia Panel: No results for input(s): VITAMINB12, FOLATE, FERRITIN, TIBC, IRON, RETICCTPCT in the last 72 hours. Urine analysis:    Component Value  Date/Time   COLORURINE AMBER (A) 05/17/2021 2049   APPEARANCEUR CLOUDY (A) 05/17/2021 2049   LABSPEC 1.021 05/17/2021 2049   PHURINE 5.0 05/17/2021 2049   GLUCOSEU NEGATIVE 05/17/2021 2049   HGBUR LARGE (A) 05/17/2021 2049   BILIRUBINUR NEGATIVE 05/17/2021 2049   BILIRUBINUR neg 09/04/2013 1443   [place] 05/17/2021 2049   PROTEINUR 100 (A) 05/17/2021 2049   UROBILINOGEN negative 09/04/2013 1443   UROBILINOGEN 0.2 06/02/2011 2235  NITRITE NEGATIVE 05/17/2021 2049   LEUKOCYTESUR NEGATIVE 05/17/2021 2049   Recent Results (from the past 240 hour(s))  Resp Panel by RT-PCR (Flu A&B, Covid) Nasopharyngeal Swab     Status: None   Collection Time: 05/17/21  6:07 PM   Specimen: Nasopharyngeal Swab; Nasopharyngeal(NP) swabs in vial transport medium  Result Value Ref Range Status   SARS Coronavirus 2 by RT PCR NEGATIVE NEGATIVE Final    Comment: (NOTE) SARS-CoV-2 target nucleic acids are NOT DETECTED.  The SARS-CoV-2 RNA is generally detectable in upper respiratory specimens during the acute phase of infection. The lowest concentration of SARS-CoV-2 viral copies this assay can detect is 138 copies/mL. A negative result does not preclude SARS-Cov-2 infection and should not be used as the sole basis for treatment or other patient management decisions. A negative result may occur with  improper specimen collection/handling, submission of specimen other than nasopharyngeal swab, presence of viral mutation(s) within the areas targeted by this assay, and inadequate number of viral copies(<138 copies/mL). A negative result must be combined with clinical observations, patient history, and epidemiological information. The expected result is Negative.  Fact Sheet for Patients:  EntrepreneurPulse.com.au  Fact Sheet for Healthcare Providers:  IncredibleEmployment.be  This test is no t yet approved or cleared by the [place] FDA and  has been  authorized for detection and/or diagnosis of SARS-CoV-2 by FDA under an Emergency Use Authorization (EUA). This EUA will remain  in effect (meaning this test can be used) for the duration of the COVID-19 declaration under Section 564(b)(1) of the Act, 21 U.S.C.section 360bbb-3(b)(1), unless the authorization is terminated  or revoked sooner.       Influenza A by PCR NEGATIVE NEGATIVE Final   Influenza B by PCR NEGATIVE NEGATIVE Final    Comment: (NOTE) The Xpert Xpress SARS-CoV-2/FLU/RSV plus assay is intended as an aid in the diagnosis of influenza from Nasopharyngeal swab specimens and should not be used as a sole basis for treatment. Nasal washings and aspirates are unacceptable for Xpert Xpress SARS-CoV-2/FLU/RSV testing.  Fact Sheet for Patients: EntrepreneurPulse.com.au  Fact Sheet for Healthcare Providers: IncredibleEmployment.be  This test is not yet approved or cleared by the [place] FDA and has been authorized for detection and/or diagnosis of SARS-CoV-2 by FDA under an Emergency Use Authorization (EUA). This EUA will remain in effect (meaning this test can be used) for the duration of the COVID-19 declaration under Section 564(b)(1) of the Act, 21 U.S.C. section 360bbb-3(b)(1), unless the authorization is terminated or revoked.  Performed at [facility], [place] [address], [place], [place] [address]   Urine culture     Status: None   Collection Time: 05/17/21  6:15 PM   Specimen: In/Out Cath Urine  Result Value Ref Range Status   Specimen Description IN/OUT CATH URINE  Final   Special Requests NONE  Final   Culture   Final    NO GROWTH Performed at [facility], [place] [address], [place], [place] [address]    Report Status 05/19/2021 FINAL  Final  Blood culture (routine x 2)     Status: None (Preliminary result)   Collection Time: 05/17/21  7:22 PM   Specimen: BLOOD  Result Value Ref Range Status   Specimen  Description BLOOD SITE NOT SPECIFIED  Final   Special Requests   Final    BOTTLES DRAWN AEROBIC AND ANAEROBIC Blood Culture adequate volume   Culture   Final    NO GROWTH 4 DAYS Performed at  [facility], [place] [address], [place], [place] [address]    Report Status PENDING  Incomplete  Culture, blood (Routine X 2) w Reflex to ID Panel     Status: None (Preliminary result)   Collection Time: 05/19/21 12:21 AM   Specimen: BLOOD RIGHT WRIST  Result Value Ref Range Status   Specimen Description BLOOD RIGHT WRIST  Final   Special Requests   Final    BOTTLES DRAWN AEROBIC ONLY Blood Culture adequate volume   Culture   Final    NO GROWTH 2 DAYS Performed at [facility], [place] [address] [place], [place] [address]    Report Status PENDING  Incomplete      Radiology Studies: No results found.  Scheduled Meds:  amLODipine  5 mg Oral Daily   aspirin EC  81 mg Oral Daily   enoxaparin (LOVENOX) injection  40 mg Subcutaneous Q24H   exemestane  25 mg Oral Daily   isosorbide mononitrate  30 mg Oral Daily   metoprolol succinate  50 mg Oral Daily   multivitamin with minerals  1 tablet Oral Daily   pantoprazole  40 mg Oral Daily   polyethylene glycol  17 g Oral Daily   predniSONE  5 mg Oral Daily   Continuous Infusions:   LOS: 4 days   Time spent: 25 minutes.  Patrecia Pour, MD [facility] [email or web address] 05/21/2021, 1:43 PM

## 2021-05-21 NOTE — NC FL2 (Signed)
[place] LEVEL OF CARE SCREENING TOOL     IDENTIFICATION  Patient Name: [name] Birthdate: Jun 24, 1946 Sex: female Admission Date (Current Location): 05/17/2021  [facility] and [place] Number:  Herbalist and Address:  [facility] [place], [place] [address], [place], [place] [address]      Provider Number: [id number]  Attending Physician Name and Address:  Patrecia Pour, MD  Relative Name and Phone Number:  Gibson,Consuella Relative [phone]    Current Level of Care: Hospital Recommended Level of Care: [place] Prior Approval Number:    Date Approved/Denied:   PASRR Number: [id number]  Discharge Plan: SNF    Current Diagnoses: Patient Active Problem List   Diagnosis Date Noted   Traumatic rhabdomyolysis ([place]) 05/18/2021   Hypokalemia 05/18/2021   Hyponatremia 05/18/2021   Fall at home, initial encounter 05/18/2021   SIRS (systemic inflammatory response syndrome) ([place]) 05/18/2021   AKI (acute kidney injury) ([place]) 05/17/2021   Iron deficiency anemia 03/01/2017   Menopausal hot flushes 09/22/2016   Breast cancer of upper-outer quadrant of right female breast ([place]) 05/28/2014   Abnormal mammogram with microcalcification 04/14/2014   Essential hypertension 01/20/2013   Persistent disorder of initiating or maintaining sleep 04/03/2012    Orientation RESPIRATION BLADDER Height & Weight     Self, Time, Situation, Place  Normal Continent Weight: 172 lb (78 kg) Height:  5\' 6"  (167.6 cm)  BEHAVIORAL SYMPTOMS/MOOD NEUROLOGICAL BOWEL NUTRITION STATUS      Continent Diet (see discharge summary)  AMBULATORY STATUS COMMUNICATION OF NEEDS Skin   Limited Assist Verbally Normal                       Personal Care Assistance Level of Assistance  Bathing, Feeding, Dressing Bathing Assistance: Independent Feeding assistance: Independent Dressing Assistance: Limited assistance     Functional Limitations Info   Sight, Hearing, Speech Sight Info: Adequate Hearing Info: Adequate Speech Info: Adequate    SPECIAL CARE FACTORS FREQUENCY  PT (By licensed PT), OT (By licensed OT)     PT Frequency: 5x week OT Frequency: 5x week            Contractures Contractures Info: Not present    Additional Factors Info  Code Status, Allergies Code Status Info: full Allergies Info: lipitor           Current Medications (05/21/2021):  This is the current hospital active medication list Current Facility-Administered Medications  Medication Dose Route Frequency Provider Last Rate Last Admin   acetaminophen (TYLENOL) tablet 650 mg  650 mg Oral BID PRN Kayleen Memos, DO   650 mg at 05/18/21 2143   ALPRAZolam (XANAX) tablet 0.25 mg  0.25 mg Oral QHS PRN Irene Pap N, DO   0.25 mg at 05/20/21 2110   amLODipine (NORVASC) tablet 5 mg  5 mg Oral Daily Kathie Dike, MD   5 mg at 05/21/21 9937   aspirin EC tablet 81 mg  81 mg Oral Daily Irene Pap N, DO   81 mg at 05/21/21 0939   enoxaparin (LOVENOX) injection 40 mg  40 mg Subcutaneous Q24H Vance Gather B, MD   40 mg at 05/21/21 1314   exemestane (AROMASIN) tablet 25 mg  25 mg Oral Daily Kathie Dike, MD   25 mg at 05/21/21 0939   isosorbide mononitrate (IMDUR) 24 hr tablet 30 mg  30 mg Oral Daily Kathie Dike, MD   30 mg at 05/21/21 [phone]  melatonin tablet 3 mg  3 mg Oral QHS PRN Irene Pap N, DO   3 mg at 05/18/21 0022   metoprolol succinate (TOPROL-XL) 24 hr tablet 50 mg  50 mg Oral Daily Kathie Dike, MD   50 mg at 05/21/21 0939   multivitamin with minerals tablet 1 tablet  1 tablet Oral Daily Pahwani, Rinka R, MD   1 tablet at 05/21/21 0939   pantoprazole (PROTONIX) EC tablet 40 mg  40 mg Oral Daily Irene Pap N, DO   40 mg at 05/21/21 0939   polyethylene glycol (MIRALAX / GLYCOLAX) packet 17 g  17 g Oral Daily Kathie Dike, MD   17 g at 05/19/21 0900   predniSONE (DELTASONE) tablet 5 mg  5 mg Oral Daily Kathie Dike, MD   5 mg at  05/21/21 6286   prochlorperazine (COMPAZINE) injection 5 mg  5 mg Intravenous Q6H PRN Kayleen Memos, DO         Discharge Medications: Please see discharge summary for a list of discharge medications.  Relevant Imaging Results:  Relevant Lab Results:   Additional Information SSN [id number].  Pt is vaccinated for covid with 2 boosters  [place] Crossen, Veronia Beets, LCSW

## 2021-05-21 NOTE — Plan of Care (Signed)
  Problem: Education: Goal: Knowledge of General Education information will improve Description Including pain rating scale, medication(s)/side effects and non-pharmacologic comfort measures Outcome: Progressing   

## 2021-05-21 NOTE — Plan of Care (Signed)
  Problem: Education: Goal: Knowledge of General Education information will improve Description: Including pain rating scale, medication(s)/side effects and non-pharmacologic comfort measures 05/21/2021 0737 by Camillia Herter, RN Outcome: Adequate for Discharge 05/21/2021 0735 by Camillia Herter, RN Outcome: Progressing   Problem: Health Behavior/Discharge Planning: Goal: Ability to manage health-related needs will improve 05/21/2021 0737 by Camillia Herter, RN Outcome: Adequate for Discharge 05/21/2021 0735 by Camillia Herter, RN Outcome: Progressing   Problem: Clinical Measurements: Goal: Ability to maintain clinical measurements within normal limits will improve 05/21/2021 0737 by Camillia Herter, RN Outcome: Adequate for Discharge 05/21/2021 0735 by Camillia Herter, RN Outcome: Progressing Goal: Will remain free from infection 05/21/2021 0737 by Camillia Herter, RN Outcome: Adequate for Discharge 05/21/2021 0735 by Camillia Herter, RN Outcome: Progressing Goal: Diagnostic test results will improve 05/21/2021 0737 by Camillia Herter, RN Outcome: Adequate for Discharge 05/21/2021 0735 by Camillia Herter, RN Outcome: Progressing Goal: Respiratory complications will improve 05/21/2021 0737 by Camillia Herter, RN Outcome: Adequate for Discharge 05/21/2021 0735 by Camillia Herter, RN Outcome: Progressing Goal: Cardiovascular complication will be avoided 05/21/2021 0737 by Camillia Herter, RN Outcome: Adequate for Discharge 05/21/2021 0735 by Camillia Herter, RN Outcome: Progressing   Problem: Activity: Goal: Risk for activity intolerance will decrease 05/21/2021 0737 by Camillia Herter, RN Outcome: Adequate for Discharge 05/21/2021 0735 by Camillia Herter, RN Outcome: Progressing   Problem: Nutrition: Goal: Adequate nutrition will be maintained 05/21/2021 0737 by Camillia Herter, RN Outcome: Adequate for Discharge 05/21/2021 0735 by Camillia Herter, RN Outcome:  Progressing   Problem: Coping: Goal: Level of anxiety will decrease 05/21/2021 0737 by Camillia Herter, RN Outcome: Adequate for Discharge 05/21/2021 0735 by Camillia Herter, RN Outcome: Progressing   Problem: Elimination: Goal: Will not experience complications related to bowel motility 05/21/2021 0737 by Camillia Herter, RN Outcome: Adequate for Discharge 05/21/2021 0735 by Camillia Herter, RN Outcome: Progressing Goal: Will not experience complications related to urinary retention 05/21/2021 0737 by Camillia Herter, RN Outcome: Adequate for Discharge 05/21/2021 0735 by Camillia Herter, RN Outcome: Progressing   Problem: Pain Managment: Goal: General experience of comfort will improve 05/21/2021 0737 by Camillia Herter, RN Outcome: Adequate for Discharge 05/21/2021 0735 by Camillia Herter, RN Outcome: Progressing   Problem: Safety: Goal: Ability to remain free from injury will improve 05/21/2021 0737 by Camillia Herter, RN Outcome: Adequate for Discharge 05/21/2021 0735 by Camillia Herter, RN Outcome: Progressing   Problem: Skin Integrity: Goal: Risk for impaired skin integrity will decrease 05/21/2021 0737 by Camillia Herter, RN Outcome: Adequate for Discharge 05/21/2021 0735 by Camillia Herter, RN Outcome: Progressing

## 2021-05-22 LAB — SARS CORONAVIRUS 2 (TAT 6-24 HRS): SARS Coronavirus 2: NEGATIVE

## 2021-05-22 LAB — CULTURE, BLOOD (ROUTINE X 2)
Culture: NO GROWTH
Special Requests: ADEQUATE

## 2021-05-22 NOTE — Progress Notes (Addendum)
Pt went to BR to pee, noted scant amt of blood in the toilet paper when she wiped. Also noted scant amt of blood in the urine hat. Pt denies burning/pain when voiding.Notified on call Dr. Marlowe Sax via text page.

## 2021-05-22 NOTE — TOC Progression Note (Signed)
Transition of Care [facility]) - Progression Note    Patient Details  Name: [name] MRN: [id number] Date of Birth: [birth year]  Transition of Care [facility]) CM/SW [place], [place] Phone Number: 05/22/2021, 1:02 PM  Clinical Narrative:     CSW gave bed offers to pt. Pt stated she wants to go to [place], she has a friend there. CSW reached out to admissions coordinator who stated they would review pt in the hup. [facility] stated if they accept pt, they can most likely take her tomorrow. CSW will begin insurance auth. CSW will request covid test from MD.   Expected Discharge Plan: [place]    Expected Discharge Plan and Services Expected Discharge Plan: [place]   Discharge Planning Services: CM Consult Post Acute Care Choice: Home Health, Durable Medical Equipment Living arrangements for the past 2 months: Apartment Expected Discharge Date: 05/21/21               DME Arranged: Berta Minor rolling DME Agency: [employer] Date DME Agency Contacted: 05/20/21 Time DME Agency Contacted: 67 Representative spoke with at DME Agency: Freda Munro HH Arranged: RN, PT, Nurse's Aide, Social Work, OT           Social Determinants of Health ([place]) Interventions    Readmission Risk Interventions No flowsheet data found.  Emeterio Reeve, Latanya Presser, [place] Social Worker [phone]

## 2021-05-22 NOTE — Progress Notes (Signed)
PROGRESS NOTE  [name]  UYQ:[id number] DOB: 02-26-1946 DOA: 05/17/2021 PCP: Charolette Forward, MD   Brief Narrative: [name] is a 75 y.o. female with a history of HTN, depression, GERD who presented to the ED 6/7 after falling and being unable to get up at home found to have rhabdomyolysis (CK 5,703) and AKI (Cr 2.69) with hyponatremia, hypokalemia, and elevated LFTs. She was admitted, given IV fluids with improvement in these parameters. She continues to be generally weak, with PT recommending SNF rehabilitation prior to returning home. The family and patient agree to this, so CSW is pursuing options.  Assessment & Plan: Active Problems:   Essential hypertension   AKI (acute kidney injury) ([place])   Traumatic rhabdomyolysis ([place])   Hypokalemia   Hyponatremia   Fall at home, initial encounter   SIRS (systemic inflammatory response syndrome) ([facility])  Rhabdomyolysis: Due to prolonged immobilization. CK has normalized as of 6/11.   AKI: SCr has normalized and patient is now taking adequate per oral intake to meet hydration and nutritional needs. We can stop IV fluids.   LFT elevation: Secondary to rhabdomyolysis. RUQ U/S without acute findings. Abd exam benign. LFTs continue improvement.  - Continue holding statin for now.   SIRS without source: Febrile with WBC 15.7k on arrival. Antibiotics stopped and no nidus of infection has presented itself after work up. Sepsis is ruled out.   HTN:  - Continue norvasc, metoprolol, holding ACE inhibitor. May restart depending on BP trends now that CrCl has normalized.   GERD: Continue PPI  Prolonged QT interval: Monitor Mg, K. Avoid provocative agents.   Hypokalemia: Resolved  Hyponatremia: Improving.  - Restart effexor  Hypophosphatemia: Improved.   HLD: Hold statin as above  Depression: Quiescent. Continue effexor, prn xanax  DVT prophylaxis: Lovenox Code Status: Full Family Communication: None at bedside Disposition Plan:   Status is: Inpatient  Remains inpatient appropriate because:Unsafe d/c plan  Dispo:  Patient From: Home Planned Disposition: SNF Medically stable for discharge: Yes  Consultants:  None  Procedures:  None  Antimicrobials: None   Subjective: No pain or new complaints. Eating and drinking normally.   Objective: Vitals:   05/21/21 2053 05/21/21 2101 05/22/21 0318 05/22/21 0749  BP: (!) 155/83 140/82 (!) 142/72 138/80  Pulse: 76 71 64 68  Resp: 18  17 18   Temp: 97.6 F (36.4 C)  98 F (36.7 C) 98.4 F (36.9 C)  TempSrc:   Oral Oral  SpO2: 97%  97% 95%  Weight:      Height:        Intake/Output Summary (Last 24 hours) at 05/22/2021 1355 Last data filed at 05/22/2021 0900 Gross per 24 hour  Intake 840 ml  Output --  Net 840 ml   Filed Weights   05/17/21 1614  Weight: 78 kg    Gen: 75 y.o. female in no distress Pulm: Nonlabored breathing room air. Clear. CV: Regular rate and rhythm. No JVD, no dependent edema. GI: Abdomen soft, non-tender, non-distended, with normoactive bowel sounds. Ext: Warm, no deformities Skin: No rashes, lesions or ulcers on visualized skin. Neuro: Alert and oriented. No focal neurological deficits. Psych: Judgement and insight appear fair. Mood euthymic & affect congruent. Behavior is appropriate.    Data Reviewed: I have personally reviewed following labs and imaging studies  CBC: Recent Labs  Lab 05/17/21 1802 05/18/21 0328 05/19/21 0021 05/20/21 0004  WBC 15.7* 11.6* 10.2 7.1  NEUTROABS 13.9*  --   --   --  HGB 14.3 12.9 11.1* 12.2  HCT 41.5 37.3 32.6* 35.3*  MCV 92.0 91.2 91.6 91.0  PLT 265 261 283 268   Basic Metabolic Panel: Recent Labs  Lab 05/17/21 1802 05/18/21 0034 05/18/21 0328 05/18/21 0810 05/18/21 1031 05/19/21 0021 05/20/21 0004 05/20/21 0752 05/21/21 0230  NA 124*   < > 125* 129* 130* 130* 133*  --  137  K 3.2*  --  2.6*  --  2.8* 3.9 4.1  --  3.5  CL 84*  --  87*  --   --  101 104  --  108  CO2  20*  --  21*  --   --  18* 20*  --  21*  GLUCOSE 76  --  93  --   --  105* 114*  --  98  BUN 41*  --  40*  --   --  20 12  --  6*  CREATININE 2.69*  --  2.02*  --   --  0.94 0.70  --  0.57  CALCIUM 8.5*  --  8.6*  --   --  8.4* 9.0  --  8.6*  MG  --   --  1.9  --   --   --   --  1.8  --   PHOS  --   --  3.5  --   --   --  1.1*  --  2.1*   < > = values in this interval not displayed.   GFR: Estimated Creatinine Clearance: 64.1 mL/min (by C-G formula based on SCr of 0.57 mg/dL). Liver Function Tests: Recent Labs  Lab 05/17/21 1802 05/18/21 0328 05/19/21 0021 05/20/21 0004 05/21/21 0230  AST 381* 301* 164* 110* 73*  ALT 146* 124* 98* 87* 71*  ALKPHOS 199* 168* 151* 149* 120  BILITOT 1.8* 1.3* 1.1 0.8 0.8  PROT 6.7 5.8* 5.4* 5.6* 5.2*  ALBUMIN 2.6* 2.2* 1.9* 2.1* 2.0*   No results for input(s): LIPASE, AMYLASE in the last 168 hours. Recent Labs  Lab 05/17/21 1802  AMMONIA 26   Coagulation Profile: Recent Labs  Lab 05/17/21 2152  INR 1.6*   Cardiac Enzymes: Recent Labs  Lab 05/17/21 1802 05/18/21 0231 05/19/21 0021 05/20/21 0004 05/21/21 0230  CKTOTAL 5,703* 2,708* 1,137* 507* 229   BNP (last 3 results) No results for input(s): PROBNP in the last 8760 hours. HbA1C: No results for input(s): HGBA1C in the last 72 hours. CBG: No results for input(s): GLUCAP in the last 168 hours. Lipid Profile: No results for input(s): CHOL, HDL, LDLCALC, TRIG, CHOLHDL, LDLDIRECT in the last 72 hours. Thyroid Function Tests: No results for input(s): TSH, T4TOTAL, FREET4, T3FREE, THYROIDAB in the last 72 hours. Anemia Panel: No results for input(s): VITAMINB12, FOLATE, FERRITIN, TIBC, IRON, RETICCTPCT in the last 72 hours. Urine analysis:    Component Value Date/Time   COLORURINE AMBER (A) 05/17/2021 2049   APPEARANCEUR CLOUDY (A) 05/17/2021 2049   LABSPEC 1.021 05/17/2021 2049   PHURINE 5.0 05/17/2021 2049   GLUCOSEU NEGATIVE 05/17/2021 2049   HGBUR LARGE (A) 05/17/2021  2049   BILIRUBINUR NEGATIVE 05/17/2021 2049   BILIRUBINUR neg 09/04/2013 1443   KETONESUR NEGATIVE 05/17/2021 2049   PROTEINUR 100 (A) 05/17/2021 2049   UROBILINOGEN negative 09/04/2013 1443   UROBILINOGEN 0.2 06/02/2011 2235   NITRITE NEGATIVE 05/17/2021 2049   LEUKOCYTESUR NEGATIVE 05/17/2021 2049   Recent Results (from the past 240 hour(s))  Resp Panel by RT-PCR (Flu A&B, Covid) Nasopharyngeal Swab  Status: None   Collection Time: 05/17/21  6:07 PM   Specimen: Nasopharyngeal Swab; Nasopharyngeal(NP) swabs in vial transport medium  Result Value Ref Range Status   SARS Coronavirus 2 by RT PCR NEGATIVE NEGATIVE Final    Comment: (NOTE) SARS-CoV-2 target nucleic acids are NOT DETECTED.  The SARS-CoV-2 RNA is generally detectable in upper respiratory specimens during the acute phase of infection. The lowest concentration of SARS-CoV-2 viral copies this assay can detect is 138 copies/mL. A negative result does not preclude SARS-Cov-2 infection and should not be used as the sole basis for treatment or other patient management decisions. A negative result may occur with  improper specimen collection/handling, submission of specimen other than nasopharyngeal swab, presence of viral mutation(s) within the areas targeted by this assay, and inadequate number of viral copies(<138 copies/mL). A negative result must be combined with clinical observations, patient history, and epidemiological information. The expected result is Negative.  Fact Sheet for Patients:  EntrepreneurPulse.com.au  Fact Sheet for Healthcare Providers:  IncredibleEmployment.be  This test is no t yet approved or cleared by the [place] FDA and  has been authorized for detection and/or diagnosis of SARS-CoV-2 by FDA under an Emergency Use Authorization (EUA). This EUA will remain  in effect (meaning this test can be used) for the duration of the COVID-19 declaration under  Section 564(b)(1) of the Act, 21 U.S.C.section 360bbb-3(b)(1), unless the authorization is terminated  or revoked sooner.       Influenza A by PCR NEGATIVE NEGATIVE Final   Influenza B by PCR NEGATIVE NEGATIVE Final    Comment: (NOTE) The Xpert Xpress SARS-CoV-2/FLU/RSV plus assay is intended as an aid in the diagnosis of influenza from Nasopharyngeal swab specimens and should not be used as a sole basis for treatment. Nasal washings and aspirates are unacceptable for Xpert Xpress SARS-CoV-2/FLU/RSV testing.  Fact Sheet for Patients: EntrepreneurPulse.com.au  Fact Sheet for Healthcare Providers: IncredibleEmployment.be  This test is not yet approved or cleared by the [place] FDA and has been authorized for detection and/or diagnosis of SARS-CoV-2 by FDA under an Emergency Use Authorization (EUA). This EUA will remain in effect (meaning this test can be used) for the duration of the COVID-19 declaration under Section 564(b)(1) of the Act, 21 U.S.C. section 360bbb-3(b)(1), unless the authorization is terminated or revoked.  Performed at [facility], [place] [address], [place], [place] [address]   Urine culture     Status: None   Collection Time: 05/17/21  6:15 PM   Specimen: In/Out Cath Urine  Result Value Ref Range Status   Specimen Description IN/OUT CATH URINE  Final   Special Requests NONE  Final   Culture   Final    NO GROWTH Performed at [facility], [place] [address], [place], [place] [address]    Report Status 05/19/2021 FINAL  Final  Blood culture (routine x 2)     Status: None   Collection Time: 05/17/21  7:22 PM   Specimen: BLOOD  Result Value Ref Range Status   Specimen Description BLOOD SITE NOT SPECIFIED  Final   Special Requests   Final    BOTTLES DRAWN AEROBIC AND ANAEROBIC Blood Culture adequate volume   Culture   Final    NO GROWTH 5 DAYS Performed at [facility], [place] [address]  [place], [place] [address]    Report Status 05/22/2021 FINAL  Final  Culture, blood (Routine X 2) w Reflex to ID Panel  Status: None (Preliminary result)   Collection Time: 05/19/21 12:21 AM   Specimen: BLOOD RIGHT WRIST  Result Value Ref Range Status   Specimen Description BLOOD RIGHT WRIST  Final   Special Requests   Final    BOTTLES DRAWN AEROBIC ONLY Blood Culture adequate volume   Culture   Final    NO GROWTH 3 DAYS Performed at [facility], [address], [place], [place] [address]    Report Status PENDING  Incomplete      Radiology Studies: No results found.  Scheduled Meds:  amLODipine  5 mg Oral Daily   aspirin EC  81 mg Oral Daily   enoxaparin (LOVENOX) injection  40 mg Subcutaneous Q24H   exemestane  25 mg Oral Daily   isosorbide mononitrate  30 mg Oral Daily   metoprolol succinate  50 mg Oral Daily   multivitamin with minerals  1 tablet Oral Daily   pantoprazole  40 mg Oral Daily   polyethylene glycol  17 g Oral Daily   ramipril  10 mg Oral BID   venlafaxine XR  37.5 mg Oral Q breakfast   Continuous Infusions:   LOS: 5 days   Time spent: 15 minutes.  Patrecia Pour, MD [facility] [email or web address] 05/22/2021, 1:55 PM

## 2021-05-23 ENCOUNTER — Inpatient Hospital Stay (HOSPITAL_COMMUNITY): Payer: Medicare PPO

## 2021-05-23 DIAGNOSIS — T796XXA Traumatic ischemia of muscle, initial encounter: Secondary | ICD-10-CM

## 2021-05-23 DIAGNOSIS — R6510 Systemic inflammatory response syndrome (SIRS) of non-infectious origin without acute organ dysfunction: Secondary | ICD-10-CM

## 2021-05-23 DIAGNOSIS — N939 Abnormal uterine and vaginal bleeding, unspecified: Secondary | ICD-10-CM

## 2021-05-23 NOTE — Discharge Summary (Signed)
Physician Discharge Summary  [name] UDJ:[id number] DOB: 07/08/1946 DOA: 05/17/2021  PCP: Charolette Forward, MD  Admit date: 05/17/2021 Discharge date: 05/23/2021  Admitted From: Home Disposition: Home   Recommendations for Outpatient Follow-up:  Follow up with PCP in 1-2 weeks Follow up with GYN for endometrial biopsy.  Monitor BMP and CBC at follow up.   Home Health: PT, OT, RN, aide, CSW Equipment/Devices: 3 in 1 Discharge Condition: Stable CODE STATUS: Full Diet recommendation: Regular  Brief/Interim Summary: [name] is a 75 y.o. female with a history of HTN, depression, GERD, breast cancer no longer on aromasin who presented to the ED 6/7 after falling and being unable to get up at home found to have rhabdomyolysis (CK 5,703) and AKI (Cr 2.69) with hyponatremia, hypokalemia, and elevated LFTs. She was admitted, given IV fluids with improvement in these parameters.   On 6/12 she developed vaginal bleeding for which prophylactic lovenox was stopped. OBGYN recommended transvaginal ultrasound which revealed thickened endometrium up to 34mm. She will require endometrial biopsy. Bleeding is not profuse and hgb is stable and normal.   She appeared weak but has worked consistently with physical therapy and regained a significant amount of strength. SNF was initially recommended though this was denied by insurance (despite peer to peer  6/13). She feels safe to be discharged home and will have maximal home health arranged as well as DME.   Discharge Diagnoses:  Active Problems:   Essential hypertension   AKI (acute kidney injury) ([place])   Traumatic rhabdomyolysis ([place])   Hypokalemia   Hyponatremia   Fall at home, initial encounter   SIRS (systemic inflammatory response syndrome) ([facility])  Rhabdomyolysis: Due to prolonged immobilization. CK has normalized as of 6/11.   Abnormal uterine bleeding: 92mm endometrium. Discussed need for biopsy at length with patient. Spoke with Dr.  Dione Plover (see note) who will send note tlers for pt to follow up. Also insured pt had GYN clinic phone number to schedule this. Bleeding is mild and will decrease with cessation of lovenox (last given 6/12).  AKI: SCr has normalized    LFT elevation: Secondary to rhabdomyolysis. RUQ U/S without acute findings. Abd exam benign. LFTs continue improvement. - Ok to restart home statin   SIRS without source: Febrile with WBC 15.7k on arrival. Antibiotics stopped and no nidus of infection has presented itself after work up. Sepsis is ruled out.   HTN: - Continue home medications.   GERD: Continue PPI   Prolonged QT interval: Monitor Mg, K at follow up. Avoid provocative agents.   Hypokalemia: Resolved   Hyponatremia: Improving.   Hypophosphatemia: Improved.   HLD: Hold statin as above   Depression: Quiescent. Continue effexor, prn xanax  Discharge Instructions Discharge Instructions     Ambulatory referral to Gynecology   Complete by: As directed    Call MD for:  difficulty breathing, headache or visual disturbances   Complete by: As directed    Call MD for:  extreme fatigue   Complete by: As directed    Call MD for:  persistant dizziness or light-headedness   Complete by: As directed    Call MD for:  persistant nausea and vomiting   Complete by: As directed    Call MD for:  severe uncontrolled pain   Complete by: As directed    Call MD for:  temperature >100.4   Complete by: As directed    Diet - low sodium heart healthy   Complete by: As directed  Discharge instructions   Complete by: As directed    You were admitted for rhabdomyolysis (muscle breakdown) after falling and being unable to get up at home. This also caused liver function abnormalities and kidney injury, both of which have improved. You have overall regained strength and the insurance company has denied an admission to skilled nursing facility for short term rehabilitation. Thus, we will arrange for home  health services and discharge home. There are no new medications to take, though work up for bleeding showed a thickened lining to the uterus which will need to be biopsied. OBGYN stated this would preferably be done as an outpatient, and state you will be contacted to schedule an appointment. If you do not hear from them, please call the phone number provided for [facility] to schedule an appointment. If your bleeding worsens, seek medical attention right away.   Increase activity slowly   Complete by: As directed       Allergies as of 05/23/2021       Reactions   Lipitor [atorvastatin] Other (See Comments)   Pt states med makes her feel "loopy"        Medication List     STOP taking these medications    exemestane 25 MG tablet Commonly known as: AROMASIN   predniSONE 5 MG tablet Commonly known as: DELTASONE       TAKE these medications    ALPRAZolam 0.25 MG tablet Commonly known as: XANAX Take 0.25 mg by mouth 2 (two) times daily as needed.   amLODipine 5 MG tablet Commonly known as: NORVASC Take 5 mg by mouth daily.   aspirin 81 MG EC tablet Take 1 tablet (81 mg total) by mouth daily.   isosorbide mononitrate 30 MG 24 hr tablet Commonly known as: IMDUR Take 30 mg by mouth daily.   metoprolol succinate 50 MG 24 hr tablet Commonly known as: TOPROL-XL Take 50 mg by mouth daily. Take with or immediately following a meal.   nitroGLYCERIN 0.4 MG SL tablet Commonly known as: NITROSTAT Place 0.4 mg under the tongue every 5 (five) minutes as needed for chest pain. Reported on 01/11/2016   ofloxacin 0.3 % ophthalmic solution Commonly known as: OCUFLOX Place 1 drop into the right eye 4 (four) times daily.   pantoprazole 40 MG tablet Commonly known as: PROTONIX Take 40 mg by mouth daily.   pravastatin 40 MG tablet Commonly known as: PRAVACHOL Take 40 mg by mouth at bedtime.   prednisoLONE acetate 1 % ophthalmic suspension Commonly known as: PRED  FORTE Place 1 drop into the right eye 4 (four) times daily.   Prolensa 0.07 % Soln Generic drug: Bromfenac Sodium Place 1 drop into the right eye daily.   ramipril 10 MG capsule Commonly known as: ALTACE Take 10 mg by mouth 2 (two) times daily.   venlafaxine XR 37.5 MG 24 hr capsule Commonly known as: Effexor XR Take 1 capsule (37.5 mg total) by mouth daily with breakfast.       ASK your doctor about these medications    Klor-Con M20 20 MEQ tablet Generic drug: potassium chloride SA TAKE 1 TABLET BY MOUTH TWICE A DAY               Durable Medical Equipment  (From admission, onward)           Start     Ordered   05/20/21 1149  For home use only DME Walker rolling  Once  Question Answer Comment  Walker: With [place]   Patient needs a walker to treat with the following condition Weakness      05/20/21 1148   05/20/21 1148  For home use only DME 3 n 1  Once        05/20/21 1148            Follow-up Information     Charolette Forward, MD .   Specialty: Cardiology Contact information: [place] [address] [address], [facility] Follow up.   Specialty: Home Health Services Contact information: [place] [address] [phone]         [facility]. Schedule an appointment as soon as possible for a visit.   Why: You will need endometrial biopsy for uterine bleeding. Address: [address], [place], [place] [address] Phone: [phone] Contact information: [place]               Allergies  Allergen Reactions   Lipitor [Atorvastatin] Other (See Comments)    Pt states med makes her feel "loopy"    Consultations: OBGYN Dr. Dione Plover  Procedures/Studies: DG Chest 1 View  Result Date: 05/17/2021 CLINICAL DATA:  Fall yesterday, weakness, low back pain EXAM: CHEST  1 VIEW COMPARISON:  03/13/2013 chest radiograph. FINDINGS: Stable  cardiomediastinal silhouette with normal heart size. No pneumothorax. No pleural effusion. Mild left basilar atelectasis. Otherwise clear lungs with no pulmonary edema. No displaced fractures in the visualized chest. IMPRESSION: Mild left basilar atelectasis. Electronically Signed   By: Ilona Sorrel M.D.   On: 05/17/2021 19:26   DG Lumbar Spine Complete  Result Date: 05/17/2021 CLINICAL DATA:  Fall yesterday, low back pain EXAM: LUMBAR SPINE - COMPLETE 4+ VIEW COMPARISON:  None. FINDINGS: This report assumes 5 non rib-bearing lumbar vertebrae. Lumbar vertebral body heights are preserved, with no fracture. Mild multilevel lumbar degenerative disc disease, most prominent at L3-4. Mild 4 mm anterolisthesis at L4-5. Mild bilateral lower lumbar facet arthropathy. No aggressive appearing focal osseous lesions. Coarse calcifications in the deep pelvis, potentially small calcified uterine fibroids. IMPRESSION: 1. No lumbar spine fracture. 2. Mild multilevel lumbar degenerative disc disease, most prominent at L3-4. 3. Mild 4 mm anterolisthesis at L4-5. Electronically Signed   By: Ilona Sorrel M.D.   On: 05/17/2021 19:29   DG Knee 1-2 Views Left  Result Date: 05/17/2021 CLINICAL DATA:  Left knee pain following a fall. EXAM: LEFT KNEE - 1-2 VIEW COMPARISON:  None. FINDINGS: In moderate anterior patellar enthesophyte fat at the site of quadriceps tendon insertion. No fracture, dislocation or effusion seen. IMPRESSION: No fracture or effusion. Electronically Signed   By: Claudie Revering M.D.   On: 05/17/2021 19:29   DG Knee 1-2 Views Right  Result Date: 05/17/2021 CLINICAL DATA:  Fall yesterday, right knee pain EXAM: RIGHT KNEE - 1-2 VIEW COMPARISON:  None. FINDINGS: No fracture, joint effusion or dislocation. Small superior right patellar enthesophyte. No focal osseous lesions. No radiopaque foreign bodies. No significant degenerative arthropathy. IMPRESSION: No right knee fracture, joint effusion or malalignment. Small  superior right patellar enthesophyte. Electronically Signed   By: Ilona Sorrel M.D.   On: 05/17/2021 19:30   CT Head Wo Contrast  Result Date: 05/17/2021 CLINICAL DATA:  Status post fall. EXAM: CT HEAD WITHOUT CONTRAST TECHNIQUE: Contiguous axial images were obtained from the base of the skull through the vertex without intravenous contrast. COMPARISON:  Apr 20, 2008 FINDINGS: Brain: There is mild cerebral atrophy with widening of the extra-axial spaces and ventricular dilatation. There are areas of decreased attenuation within the white matter tracts of the supratentorial brain, consistent with microvascular disease changes. Small chronic left basal ganglia lacunar infarcts are seen. Vascular: No hyperdense vessel or unexpected calcification. Skull: Normal. Negative for fracture or focal lesion. Sinuses/Orbits: No acute finding. Other: Very mild left frontal scalp soft tissue swelling is noted. IMPRESSION: 1. Generalized cerebral atrophy. 2. No acute intracranial abnormality. Electronically Signed   By: Virgina Norfolk M.D.   On: 05/17/2021 19:41   CT Cervical Spine Wo Contrast  Result Date: 05/17/2021 CLINICAL DATA:  Generalized weakness.  Fall yesterday. EXAM: CT CERVICAL SPINE WITHOUT CONTRAST TECHNIQUE: Multidetector CT imaging of the cervical spine was performed without intravenous contrast. Multiplanar CT image reconstructions were also generated. COMPARISON:  None. FINDINGS: Alignment: Patient is tilted. No traumatic subluxation. Mild straightening of normal lordosis. Skull base and vertebrae: No acute fracture. Vertebral body heights are maintained. The dens and skull base are intact. Soft tissues and spinal canal: No prevertebral fluid or swelling. No visible canal hematoma. Disc levels: Degenerative disc disease at multiple levels most prominently affecting C5-C6 and C6-C7. Scattered facet hypertrophy. No high-grade bony canal stenosis. Upper chest: No acute findings.  Emphysema. Other: None.  IMPRESSION: Degenerative change in the cervical spine without acute fracture or subluxation. Electronically Signed   By: Keith Rake M.D.   On: 05/17/2021 19:42   US PELVIS TRANSVAGINAL NON-OB (TV ONLY)  Result Date: 05/23/2021 CLINICAL DATA:  Abnormal uterine bleeding. EXAM: ULTRASOUND PELVIS TRANSVAGINAL TECHNIQUE: Transvaginal ultrasound examination of the pelvis was performed including evaluation of the uterus, ovaries, adnexal regions, and pelvic cul-de-sac. COMPARISON:  CT abdomen pelvis dated November 08, 2005. Pelvic ultrasound dated July 04, 2004. FINDINGS: Uterus Measurements: 8.1 x 7.9 x 6.9 cm = volume: 230 mL. 4.1 cm subserosal anterior fundal fibroid. 1.9 cm subserosal anterior body fibroid. 3.3 cm subserosal posterior fundal fibroid. Endometrium Thickness: 9 mm.  No focal abnormality visualized. Right ovary Not visualized. Left ovary Not visualized. Other findings:  No abnormal free fluid IMPRESSION: 1. Thickened endometrium measuring up to 9 mm. In the setting of post-menopausal bleeding, endometrial sampling is indicated to exclude carcinoma. If results are benign, sonohysterogram should be considered for focal lesion work-up. (Ref: Radiological Reasoning: Algorithmic Workup of Abnormal Vaginal Bleeding with Endovaginal Sonography and Sonohysterography. AJR 2008; 124:P80-99) 2. Fibroid uterus. 3. Nonvisualization of the ovaries. Electronically Signed   By: Titus Dubin M.D.   On: 05/23/2021 11:46   US Abdomen Limited  Result Date: 05/17/2021 CLINICAL DATA:  Right upper quadrant abdominal pain, elevated liver function tests EXAM: ULTRASOUND ABDOMEN LIMITED RIGHT UPPER QUADRANT COMPARISON:  None. FINDINGS: Gallbladder: No gallstones or wall thickening visualized. No sonographic Murphy sign noted by sonographer. Common bile duct: Diameter: 6-7 mm in proximal diameter Liver: No focal lesion identified. Within normal limits in parenchymal echogenicity. Portal vein is patent on color  Doppler imaging with normal direction of blood flow towards the liver. Other: None. IMPRESSION: Normal right upper quadrant sonogram Electronically Signed   By: Fidela Salisbury MD   On: 05/17/2021 21:32   DG Hip Unilat W or Wo Pelvis 2-3 Views Left  Result Date: 05/17/2021 CLINICAL DATA:  Fall.  Weakness. EXAM: DG HIP (WITH OR WITHOUT PELVIS) 2-3V LEFT COMPARISON:  None. FINDINGS: There is no evidence of hip fracture or dislocation. Mild bilateral hip osteoarthritis. Several calcified fibroids identified within the pelvis.  IMPRESSION: 1. No acute findings. 2. Mild bilateral hip osteoarthritis. Electronically Signed   By: Kerby Moors M.D.   On: 05/17/2021 19:27    Subjective: No new pain or dyspnea. Worked with PT and walked 260ft with standby assist. Began having bleeding with red when wiping last night which has continued.   Discharge Exam: Vitals:   05/23/21 0419 05/23/21 1626  BP: (!) 141/78 120/68  Pulse: 65 65  Resp: 20 18  Temp: 97.8 F (36.6 C)   SpO2: 98% 99%   General: Pt is alert, awake, not in acute distress Cardiovascular: RRR, S1/S2 +, no rubs, no gallops Respiratory: CTA bilaterally, no wheezing, no rhonchi Abdominal: Soft, NT, ND, bowel sounds + Extremities: No edema, no cyanosis  Labs: BNP (last 3 results) No results for input(s): BNP in the last 8760 hours. Basic Metabolic Panel: Recent Labs  Lab 05/17/21 1802 05/18/21 0034 05/18/21 0328 05/18/21 0810 05/18/21 1031 05/19/21 0021 05/20/21 0004 05/20/21 0752 05/21/21 0230  NA 124*   < > 125* 129* 130* 130* 133*  --  137  K 3.2*  --  2.6*  --  2.8* 3.9 4.1  --  3.5  CL 84*  --  87*  --   --  101 104  --  108  CO2 20*  --  21*  --   --  18* 20*  --  21*  GLUCOSE 76  --  93  --   --  105* 114*  --  98  BUN 41*  --  40*  --   --  20 12  --  6*  CREATININE 2.69*  --  2.02*  --   --  0.94 0.70  --  0.57  CALCIUM 8.5*  --  8.6*  --   --  8.4* 9.0  --  8.6*  MG  --   --  1.9  --   --   --   --  1.8  --   PHOS   --   --  3.5  --   --   --  1.1*  --  2.1*   < > = values in this interval not displayed.   Liver Function Tests: Recent Labs  Lab 05/17/21 1802 05/18/21 0328 05/19/21 0021 05/20/21 0004 05/21/21 0230  AST 381* 301* 164* 110* 73*  ALT 146* 124* 98* 87* 71*  ALKPHOS 199* 168* 151* 149* 120  BILITOT 1.8* 1.3* 1.1 0.8 0.8  PROT 6.7 5.8* 5.4* 5.6* 5.2*  ALBUMIN 2.6* 2.2* 1.9* 2.1* 2.0*   No results for input(s): LIPASE, AMYLASE in the last 168 hours. Recent Labs  Lab 05/17/21 1802  AMMONIA 26   CBC: Recent Labs  Lab 05/17/21 1802 05/18/21 0328 05/19/21 0021 05/20/21 0004  WBC 15.7* 11.6* 10.2 7.1  NEUTROABS 13.9*  --   --   --   HGB 14.3 12.9 11.1* 12.2  HCT 41.5 37.3 32.6* 35.3*  MCV 92.0 91.2 91.6 91.0  PLT 265 261 283 338   Cardiac Enzymes: Recent Labs  Lab 05/17/21 1802 05/18/21 0231 05/19/21 0021 05/20/21 0004 05/21/21 0230  CKTOTAL 5,703* 2,708* 1,137* 507* 229   BNP: Invalid input(s): POCBNP CBG: No results for input(s): GLUCAP in the last 168 hours. D-Dimer No results for input(s): DDIMER in the last 72 hours. Hgb A1c No results for input(s): HGBA1C in the last 72 hours. Lipid Profile No results for input(s): CHOL, HDL, LDLCALC, TRIG, CHOLHDL, LDLDIRECT in the last 72 hours. Thyroid function studies  No results for input(s): TSH, T4TOTAL, T3FREE, THYROIDAB in the last 72 hours.  Invalid input(s): FREET3 Anemia work up No results for input(s): VITAMINB12, FOLATE, FERRITIN, TIBC, IRON, RETICCTPCT in the last 72 hours. Urinalysis    Component Value Date/Time   COLORURINE AMBER (A) 05/17/2021 2049   APPEARANCEUR CLOUDY (A) 05/17/2021 2049   LABSPEC 1.021 05/17/2021 2049   PHURINE 5.0 05/17/2021 2049   GLUCOSEU NEGATIVE 05/17/2021 2049   HGBUR LARGE (A) 05/17/2021 2049   BILIRUBINUR NEGATIVE 05/17/2021 2049   BILIRUBINUR neg 09/04/2013 1443   KETONESUR NEGATIVE 05/17/2021 2049   PROTEINUR 100 (A) 05/17/2021 2049   UROBILINOGEN negative  09/04/2013 1443   UROBILINOGEN 0.2 06/02/2011 2235   NITRITE NEGATIVE 05/17/2021 2049   LEUKOCYTESUR NEGATIVE 05/17/2021 2049    Microbiology Recent Results (from the past 240 hour(s))  Resp Panel by RT-PCR (Flu A&B, Covid) Nasopharyngeal Swab     Status: None   Collection Time: 05/17/21  6:07 PM   Specimen: Nasopharyngeal Swab; Nasopharyngeal(NP) swabs in vial transport medium  Result Value Ref Range Status   SARS Coronavirus 2 by RT PCR NEGATIVE NEGATIVE Final    Comment: (NOTE) SARS-CoV-2 target nucleic acids are NOT DETECTED.  The SARS-CoV-2 RNA is generally detectable in upper respiratory specimens during the acute phase of infection. The lowest concentration of SARS-CoV-2 viral copies this assay can detect is 138 copies/mL. A negative result does not preclude SARS-Cov-2 infection and should not be used as the sole basis for treatment or other patient management decisions. A negative result may occur with  improper specimen collection/handling, submission of specimen other than nasopharyngeal swab, presence of viral mutation(s) within the areas targeted by this assay, and inadequate number of viral copies(<138 copies/mL). A negative result must be combined with clinical observations, patient history, and epidemiological information. The expected result is Negative.  Fact Sheet for Patients:  EntrepreneurPulse.com.au  Fact Sheet for Healthcare Providers:  IncredibleEmployment.be  This test is no t yet approved or cleared by the [place] FDA and  has been authorized for detection and/or diagnosis of SARS-CoV-2 by FDA under an Emergency Use Authorization (EUA). This EUA will remain  in effect (meaning this test can be used) for the duration of the COVID-19 declaration under Section 564(b)(1) of the Act, 21 U.S.C.section 360bbb-3(b)(1), unless the authorization is terminated  or revoked sooner.       Influenza A by PCR NEGATIVE  NEGATIVE Final   Influenza B by PCR NEGATIVE NEGATIVE Final    Comment: (NOTE) The Xpert Xpress SARS-CoV-2/FLU/RSV plus assay is intended as an aid in the diagnosis of influenza from Nasopharyngeal swab specimens and should not be used as a sole basis for treatment. Nasal washings and aspirates are unacceptable for Xpert Xpress SARS-CoV-2/FLU/RSV testing.  Fact Sheet for Patients: EntrepreneurPulse.com.au  Fact Sheet for Healthcare Providers: IncredibleEmployment.be  This test is not yet approved or cleared by the [place] FDA and has been authorized for detection and/or diagnosis of SARS-CoV-2 by FDA under an Emergency Use Authorization (EUA). This EUA will remain in effect (meaning this test can be used) for the duration of the COVID-19 declaration under Section 564(b)(1) of the Act, 21 U.S.C. section 360bbb-3(b)(1), unless the authorization is terminated or revoked.  Performed at [facility], [place] [address], [place], [place] [address]   Urine culture     Status: None   Collection Time: 05/17/21  6:15 PM   Specimen: In/Out Cath Urine  Result Value Ref Range Status   Specimen  Description IN/OUT CATH URINE  Final   Special Requests NONE  Final   Culture   Final    NO GROWTH Performed at [facility], [place] [address], [place], [place] [address]    Report Status 05/19/2021 FINAL  Final  Blood culture (routine x 2)     Status: None   Collection Time: 05/17/21  7:22 PM   Specimen: BLOOD  Result Value Ref Range Status   Specimen Description BLOOD SITE NOT SPECIFIED  Final   Special Requests   Final    BOTTLES DRAWN AEROBIC AND ANAEROBIC Blood Culture adequate volume   Culture   Final    NO GROWTH 5 DAYS Performed at [facility], [place] [address], [place], [place] [address]    Report Status 05/22/2021 FINAL  Final  Culture, blood (Routine X 2) w Reflex to ID Panel     Status: None (Preliminary result)   Collection  Time: 05/19/21 12:21 AM   Specimen: BLOOD RIGHT WRIST  Result Value Ref Range Status   Specimen Description BLOOD RIGHT WRIST  Final   Special Requests   Final    BOTTLES DRAWN AEROBIC ONLY Blood Culture adequate volume   Culture   Final    NO GROWTH 4 DAYS Performed at [facility], [place] [address], [place], [place] [address]    Report Status PENDING  Incomplete  SARS CORONAVIRUS 2 (TAT 6-24 HRS) Nasopharyngeal Nasopharyngeal Swab     Status: None   Collection Time: 05/22/21  2:02 PM   Specimen: Nasopharyngeal Swab  Result Value Ref Range Status   SARS Coronavirus 2 NEGATIVE NEGATIVE Final    Comment: (NOTE) SARS-CoV-2 target nucleic acids are NOT DETECTED.  The SARS-CoV-2 RNA is generally detectable in upper and lower respiratory specimens during the acute phase of infection. Negative results do not preclude SARS-CoV-2 infection, do not rule out co-infections with other pathogens, and should not be used as the sole basis for treatment or other patient management decisions. Negative results must be combined with clinical observations, patient history, and epidemiological information. The expected result is Negative.  Fact Sheet for Patients: SugarRoll.be  Fact Sheet for Healthcare Providers: https://www.woods-mathews.com/  This test is not yet approved or cleared by the [place] FDA and  has been authorized for detection and/or diagnosis of SARS-CoV-2 by FDA under an Emergency Use Authorization (EUA). This EUA will remain  in effect (meaning this test can be used) for the duration of the COVID-19 declaration under Se ction 564(b)(1) of the Act, 21 U.S.C. section 360bbb-3(b)(1), unless the authorization is terminated or revoked sooner.  Performed at [facility], [place] [address], [place], [place] [address]     Time coordinating discharge: Approximately 40 minutes  Patrecia Pour, MD  [facility] 05/23/2021,  5:33 PM

## 2021-05-23 NOTE — Progress Notes (Signed)
Contacted by Dr. Bonner Puna regarding patient's new complaint of vaginal bleeding.  Patient admitted for fall and current dispo planning is to send to SNF for rehab.  Per his discussion with patient she has had new vaginal bleeding that started during this admission. Patient has not had any bleeding since menopause in her late 65's. Review of chart shows normal pap smear from 2014. No pelvic imaging on file.  Recommend patient obtain TVUS (ideally before discharge) and referral be placed to follow up in clinic for endometrial biopsy. Message sent to clinic to facilitate referral.  Clarnce Flock, MD/MPH Attending Family Medicine Physician, [facility] for [facility], [place]

## 2021-05-23 NOTE — Care Management Important Message (Signed)
Important Message  Patient Details  Name: [name] MRN: [id number] Date of Birth: December 31, 1945   Medicare Important Message Given:  Yes     Orbie Pyo 05/23/2021, 3:57 PM

## 2021-05-23 NOTE — Progress Notes (Signed)
Physical Therapy Treatment Patient Details Name: [name] MRN: [id number] DOB: 22-Oct-1946 Today's Date: 05/23/2021    History of Present Illness The pt is a 75 yo female presenting 6/7 after sustaining a fall at home. Work-up revealed AKI, rhabdomyolysis and electrolyte abnormalities including hypokalemia and hyponatremia. No acute injury as result of fall. PMH includes: arthritis, CAD, GERD, HTN, MI, and lupus.    PT Comments    The pt continues to make good progress with mobility and stability this morning. She is able to complete bed mobility and sit-stand transfers on modI level, but continues to benefit from close guard for more dynamic standing activities. The pt was able to complete hallway ambulation with challenges such as x5 bouts of increased speed, walking marches, quick stops, and turns. The pt was able to progress to short bout of mobility without UE support, but will continue to benefit from skilled PT to maximize safety as pt lives alone and would require at least supervision for mobility at this time. Will benefit from short stint of rehab to improve dynamic stability and safety awareness.     Follow Up Recommendations  SNF;Supervision for mobility/OOB     Equipment Recommendations  Rolling walker with 5" wheels;3in1 (PT)    Recommendations for Other Services       Precautions / Restrictions Precautions Precautions: Fall Precaution Comments: pt admitted s/p fall Restrictions Weight Bearing Restrictions: No    Mobility  Bed Mobility Overal bed mobility: Modified Independent             General bed mobility comments: use of bed rails, but able to complete without physical assist    Transfers Overall transfer level: Modified independent Equipment used: Rolling walker (2 wheeled);None Transfers: Sit to/from Stand Sit to Stand: Modified independent (Device/Increase time)         General transfer comment: pt with improved power to stand, cues for  hand position, but then able to complete without assist. good controlled lower to commode  Ambulation/Gait Ambulation/Gait assistance: Supervision;Min guard Gait Distance (Feet): 200 Feet Assistive device: Rolling walker (2 wheeled);None Gait Pattern/deviations: Step-through pattern;Decreased stride length;Trunk flexed Gait velocity: 0.33 m/s preferred walking speed; 0.66 m/s when cued to walk "as fast as possible" Gait velocity interpretation: 1.31 - 2.62 ft/sec, indicative of limited community ambulator General Gait Details: cues for posture, position in RW, pt with minimal adjustements with cues. Pt then able to trial 50 ft without RW, slowed gait, increased lateral movement, but no LOB       Balance Overall balance assessment: Needs assistance Sitting-balance support: No upper extremity supported;Feet supported Sitting balance-Leahy Scale: Good Sitting balance - Comments: static sitting without UE support   Standing balance support: No upper extremity supported;During functional activity Standing balance-Leahy Scale: Fair Standing balance comment: able to bend over and reach outside BOS without LOB                            Cognition Arousal/Alertness: Awake/alert Behavior During Therapy: WFL for tasks assessed/performed Overall Cognitive Status: No family/caregiver present to determine baseline cognitive functioning Area of Impairment: Safety/judgement;Memory                     Memory: Decreased short-term memory   Safety/Judgement: Decreased awareness of safety;Decreased awareness of deficits     General Comments: pt remains easily distracted, unable to find her way back to her room even after being told room number and orienting to  room signs she was passing. The pt also needed some increased cues for safety with mobility             Pertinent Vitals/Pain Pain Assessment: No/denies pain     PT Goals (current goals can now be found in the  care plan section) Acute Rehab PT Goals Patient Stated Goal: go home PT Goal Formulation: With patient Time For Goal Achievement: 06/02/21 Potential to Achieve Goals: Good Progress towards PT goals: Progressing toward goals    Frequency    Min 3X/week      PT Plan Current plan remains appropriate       AM-PAC PT "6 Clicks" Mobility   Outcome Measure  Help needed turning from your back to your side while in a flat bed without using bedrails?: None Help needed moving from lying on your back to sitting on the side of a flat bed without using bedrails?: None Help needed moving to and from a bed to a chair (including a wheelchair)?: A Little Help needed standing up from a chair using your arms (e.g., wheelchair or bedside chair)?: None Help needed to walk in hospital room?: A Little Help needed climbing 3-5 steps with a railing? : A Little 6 Click Score: 21    End of Session Equipment Utilized During Treatment: Gait belt Activity Tolerance: Patient tolerated treatment well Patient left: with call bell/phone within reach;with nursing/sitter in room (pt in bathroom) Nurse Communication: Mobility status PT Visit Diagnosis: Unsteadiness on feet (R26.81);Muscle weakness (generalized) (M62.81);History of falling (Z91.81)     Time: 9379-0240 PT Time Calculation (min) (ACUTE ONLY): 18 min  Charges:  $Gait Training: 8-22 mins                     Karma Ganja, PT, DPT   Acute Rehabilitation Department Pager #: [phone]   Otho Bellows 05/23/2021, 8:32 AM

## 2021-05-23 NOTE — Discharge Planning (Signed)
Discharge orders acknowledged. Per progression discussion, we are waiting to hear back regarding confirmation of bed approval. Patient expressed concern of blood in urine/on peri-pad. MD notified and went in to assess. No further concerns/new orders in relation to bleeding.

## 2021-05-23 NOTE — Progress Notes (Signed)
Informed patient that there is already a discharged order for her to go home but patient is not yet ready to go home since pt's sister will only be available tomorrow morning to pick her up from [facility] and bring also pt's DME (rolling walker and 3 in 1).  Will inform on-call [facility] accordingly thru text page.

## 2021-05-23 NOTE — Progress Notes (Signed)
Physical Therapy Treatment Patient Details Name: [name] MRN: [id number] DOB: [birth year] Today's Date: 05/23/2021    History of Present Illness The pt is a 75 yo female presenting 6/7 after sustaining a fall at home. Work-up revealed AKI, rhabdomyolysis and electrolyte abnormalities including hypokalemia and hyponatremia. No acute injury as result of fall. PMH includes: arthritis, CAD, GERD, HTN, MI, and lupus.    PT Comments    Pt seen for second session to practice stair training to allow for safe d/c home. The pt was able to complete 12 steps with use of single rail and supervision for safety. Discussed family members being present for stair navigation (at least initially) and decreasing frequency of completing stairs until improved stability with HHPT. The pt verbalized understanding and was able to demo improved stability for short distance ambulation in the room both with and without UE support. Pt is safe to return home with intermittent supervision/assist from family.    Follow Up Recommendations  Home health PT;Supervision for mobility/OOB     Equipment Recommendations  Rolling walker with 5" wheels;3in1 (PT)    Recommendations for Other Services       Precautions / Restrictions Precautions Precautions: Fall Precaution Comments: pt admitted s/p fall Restrictions Weight Bearing Restrictions: No    Mobility  Bed Mobility Overal bed mobility: Modified Independent             General bed mobility comments: use of bed rails, but able to complete without physical assist    Transfers Overall transfer level: Modified independent Equipment used: None;Rolling walker (2 wheeled) Transfers: Sit to/from Stand Sit to Stand: Modified independent (Device/Increase time)         General transfer comment: pt with improved power to stand, cues for hand position, but then able to complete without assist. good controlled lower to  commode  Ambulation/Gait Ambulation/Gait assistance: Supervision Gait Distance (Feet): 15 Feet Assistive device: None Gait Pattern/deviations: Step-through pattern;Decreased stride length;Trunk flexed     General Gait Details: pt with no overt LOB, slow but steady   Stairs Stairs: Yes Stairs assistance: Supervision Stair Management: One rail Right;Step to pattern;Forwards Number of Stairs: 12 General stair comments: pt slow but steady, trialed sideways with BUE on rails but prefers single UE support   Wheelchair Mobility    Modified Rankin (Stroke Patients Only)       Balance Overall balance assessment: Needs assistance Sitting-balance support: No upper extremity supported;Feet supported Sitting balance-Leahy Scale: Good     Standing balance support: No upper extremity supported;During functional activity Standing balance-Leahy Scale: Good                              Cognition Arousal/Alertness: Awake/alert Behavior During Therapy: WFL for tasks assessed/performed Overall Cognitive Status: No family/caregiver present to determine baseline cognitive functioning Area of Impairment: Safety/judgement;Memory                     Memory: Decreased short-term memory   Safety/Judgement: Decreased awareness of safety;Decreased awareness of deficits     General Comments: pt able to follow all cues and commands, good carryover from education in prior session             Pertinent Vitals/Pain Pain Assessment: No/denies pain     PT Goals (current goals can now be found in the care plan section) Acute Rehab PT Goals Patient Stated Goal: go home PT Goal Formulation: With patient Time For  Goal Achievement: 06/02/21 Potential to Achieve Goals: Good Progress towards PT goals: Progressing toward goals    Frequency    Min 3X/week      PT Plan Discharge plan needs to be updated       AM-PAC PT "6 Clicks" Mobility   Outcome Measure  Help  needed turning from your back to your side while in a flat bed without using bedrails?: None Help needed moving from lying on your back to sitting on the side of a flat bed without using bedrails?: None Help needed moving to and from a bed to a chair (including a wheelchair)?: A Little Help needed standing up from a chair using your arms (e.g., wheelchair or bedside chair)?: None Help needed to walk in hospital room?: A Little Help needed climbing 3-5 steps with a railing? : A Little 6 Click Score: 21    End of Session Equipment Utilized During Treatment: Gait belt Activity Tolerance: Patient tolerated treatment well Patient left: with call bell/phone within reach (in bathroom) Nurse Communication: Mobility status PT Visit Diagnosis: Unsteadiness on feet (R26.81);Muscle weakness (generalized) (M62.81);History of falling (Z91.81)     Time: 8387-0658 PT Time Calculation (min) (ACUTE ONLY): 14 min  Charges:  $Gait Training: 8-22 mins                     Karma Ganja, PT, DPT   Acute Rehabilitation Department Pager #: [phone]   Otho Bellows 05/23/2021, 5:56 PM

## 2021-05-23 NOTE — TOC Progression Note (Addendum)
Transition of Care [facility]) - Progression Note    Patient Details  Name: [name] MRN: [id number] Date of Birth: [birth year]  Transition of Care [facility]) CM/SW [place], [place] Phone Number: 05/23/2021, 10:59 AM  Clinical Narrative:     Pts insurance is requesting a peer to peer. MD can call into [phone] option 5. Pts plan ID number is [id number]. Peer to peer must be completed by 2pm today. CSW notified MD via secure chat.  2:00- MD completed peer to peer and it was determined that pt has been denied SNF at this time. CSW informed pt and she stated she understands and is fine with going home. Pt reports her only worry is that she has 16 steps to get into her bedroom and would like to work with PT on steps before discharge. Pt also has concerns about bleeding. CSW made MD aware.   NCM has already set up HHRN/ PT/OT/SW and aide with [facility]. Pt report her sister will pick her up at discharge.    Expected Discharge Plan: [place]    Expected Discharge Plan and Services Expected Discharge Plan: [place]   Discharge Planning Services: CM Consult Post Acute Care Choice: Home Health, Durable Medical Equipment Living arrangements for the past 2 months: Apartment Expected Discharge Date: 05/23/21               DME Arranged: Berta Minor rolling DME Agency: [employer] Date DME Agency Contacted: 05/20/21 Time DME Agency Contacted: 74 Representative spoke with at DME Agency: Freda Munro HH Arranged: RN, PT, Nurse's Aide, Social Work, OT           Social Determinants of Health ([place]) Interventions    Readmission Risk Interventions No flowsheet data found.  Emeterio Reeve, Latanya Presser, [place] Social Worker [phone]

## 2021-05-24 LAB — CULTURE, BLOOD (ROUTINE X 2)
Culture: NO GROWTH
Special Requests: ADEQUATE

## 2021-05-24 NOTE — Plan of Care (Signed)
  Problem: Acute Rehab PT Goals(only PT should resolve) Goal: Pt Will Go Supine/Side To Sit Outcome: Adequate for Discharge Goal: Patient Will Perform Sitting Balance Outcome: Adequate for Discharge Goal: Pt Will Ambulate Outcome: Adequate for Discharge Goal: Pt Will Go Up/Down Stairs Outcome: Adequate for Discharge Goal: Pt/caregiver will Perform Home Exercise Program Outcome: Adequate for Discharge   Problem: Acute Rehab OT Goals (only OT should resolve) Goal: Pt. Will Perform Grooming Outcome: Adequate for Discharge Goal: Pt. Will Perform Lower Body Bathing Outcome: Adequate for Discharge Goal: Pt. Will Perform Lower Body Dressing Outcome: Adequate for Discharge Goal: Pt. Will Transfer To Toilet Outcome: Adequate for Discharge   Problem: Increased Nutrient Needs (NI-5.1) Goal: Food and/or nutrient delivery Description: Individualized approach for food/nutrient provision. Outcome: Adequate for Discharge   Problem: Education: Goal: Ability to demonstrate appropriate child care will improve Outcome: Adequate for Discharge Goal: Ability to verbalize an understanding of newborn treatment and procedures will improve Outcome: Adequate for Discharge Goal: Ability to demonstrate an understanding of appropriate nutrition and feeding will improve Outcome: Adequate for Discharge Goal: Individualized Educational Video(s) Outcome: Adequate for Discharge   Problem: Nutritional: Goal: Nutritional status of the infant will improve as evidenced by minimal weight loss and appropriate weight gain for gestational age Outcome: Adequate for Discharge Goal: Ability to maintain a balanced intake and output will improve Outcome: Adequate for Discharge   Problem: Clinical Measurements: Goal: Ability to maintain clinical measurements within normal limits will improve Outcome: Adequate for Discharge   Problem: Skin Integrity: Goal: Risk for impaired skin integrity will decrease Outcome:  Adequate for Discharge Goal: Demonstrates signs of wound healing without infection Outcome: Adequate for Discharge

## 2021-05-24 NOTE — Progress Notes (Signed)
Patient was discharged after reassessment yesterday afternoon with the patient confirming vaginal bleeding was slowing down. She stated that she's feeling stronger and stronger, has no reservations about going home. Unfortunately, she did not leave the hospital until early this morning due to inability to have her sister bring a rolling walker and pick patient up yesterday. The patient had already left by the time I got to the unit this morning. Vital signs were stable.  Vance Gather, MD 05/24/2021 12:45 PM

## 2021-06-10 ENCOUNTER — Ambulatory Visit: Payer: Medicare PPO | Admitting: Family Medicine

## 2021-06-15 ENCOUNTER — Ambulatory Visit: Payer: Medicare PPO

## 2021-06-29 ENCOUNTER — Ambulatory Visit
Admission: RE | Admit: 2021-06-29 | Discharge: 2021-06-29 | Disposition: A | Discharge status: Home / Self Care | Payer: Medicare PPO | Source: Ambulatory Visit | Attending: Hematology | Admitting: Hematology

## 2021-06-29 ENCOUNTER — Other Ambulatory Visit: Payer: Self-pay

## 2021-06-29 DIAGNOSIS — Z1231 Encounter for screening mammogram for malignant neoplasm of breast: Secondary | ICD-10-CM

## 2022-02-25 ENCOUNTER — Other Ambulatory Visit: Payer: Self-pay | Admitting: Hematology

## 2022-02-25 DIAGNOSIS — D509 Iron deficiency anemia, unspecified: Secondary | ICD-10-CM

## 2022-02-27 ENCOUNTER — Other Ambulatory Visit: Payer: Self-pay

## 2022-02-27 DIAGNOSIS — D509 Iron deficiency anemia, unspecified: Secondary | ICD-10-CM

## 2022-06-19 ENCOUNTER — Other Ambulatory Visit: Payer: Self-pay | Admitting: Hematology

## 2022-06-19 DIAGNOSIS — Z1231 Encounter for screening mammogram for malignant neoplasm of breast: Secondary | ICD-10-CM

## 2022-07-05 ENCOUNTER — Ambulatory Visit: Payer: Medicare PPO

## 2022-09-22 ENCOUNTER — Ambulatory Visit
Admission: RE | Admit: 2022-09-22 | Discharge: 2022-09-22 | Disposition: A | Discharge status: Home / Self Care | Payer: Medicare PPO | Source: Ambulatory Visit | Attending: Hematology | Admitting: Hematology

## 2022-09-22 DIAGNOSIS — Z1231 Encounter for screening mammogram for malignant neoplasm of breast: Secondary | ICD-10-CM

## 2023-02-28 ENCOUNTER — Other Ambulatory Visit: Payer: Self-pay | Admitting: Hematology

## 2023-02-28 DIAGNOSIS — D509 Iron deficiency anemia, unspecified: Secondary | ICD-10-CM

## 2023-06-05 ENCOUNTER — Ambulatory Visit: Payer: Medicare PPO | Admitting: Family Medicine

## 2023-06-05 ENCOUNTER — Encounter: Payer: Self-pay | Admitting: Family Medicine

## 2023-06-05 VITALS — BP 138/90 | HR 70 | Temp 97.3°F | Ht 66.0 in | Wt 186.0 lb

## 2023-06-05 DIAGNOSIS — I10 Essential (primary) hypertension: Secondary | ICD-10-CM | POA: Diagnosis not present

## 2023-06-05 DIAGNOSIS — R10814 Left lower quadrant abdominal tenderness: Secondary | ICD-10-CM | POA: Insufficient documentation

## 2023-06-05 DIAGNOSIS — D509 Iron deficiency anemia, unspecified: Secondary | ICD-10-CM

## 2023-06-05 DIAGNOSIS — Z7689 Persons encountering health services in other specified circumstances: Secondary | ICD-10-CM | POA: Diagnosis not present

## 2023-06-05 DIAGNOSIS — R1032 Left lower quadrant pain: Secondary | ICD-10-CM

## 2023-06-05 LAB — CBC WITH DIFFERENTIAL/PLATELET
Basophils Absolute: 0 10*3/uL (ref 0.0–0.1)
Basophils Relative: 0.4 % (ref 0.0–3.0)
Eosinophils Absolute: 0.2 10*3/uL (ref 0.0–0.7)
Eosinophils Relative: 2.6 % (ref 0.0–5.0)
HCT: 37.7 % (ref 36.0–46.0)
Hemoglobin: 11.7 g/dL — ABNORMAL LOW (ref 12.0–15.0)
Lymphocytes Relative: 32.7 % (ref 12.0–46.0)
Lymphs Abs: 1.9 10*3/uL (ref 0.7–4.0)
MCHC: 31.2 g/dL (ref 30.0–36.0)
MCV: 87.9 fl (ref 78.0–100.0)
Monocytes Absolute: 0.4 10*3/uL (ref 0.1–1.0)
Monocytes Relative: 7.4 % (ref 3.0–12.0)
Neutro Abs: 3.3 10*3/uL (ref 1.4–7.7)
Neutrophils Relative %: 56.9 % (ref 43.0–77.0)
Platelets: 398 10*3/uL (ref 150.0–400.0)
RBC: 4.28 Mil/uL (ref 3.87–5.11)
RDW: 14.2 % (ref 11.5–15.5)
WBC: 5.8 10*3/uL (ref 4.0–10.5)

## 2023-06-05 LAB — POC URINALSYSI DIPSTICK (AUTOMATED)
Bilirubin, UA: NEGATIVE
Blood, UA: NEGATIVE
Glucose, UA: NEGATIVE
Ketones, UA: NEGATIVE
Leukocytes, UA: NEGATIVE
Nitrite, UA: NEGATIVE
Protein, UA: NEGATIVE
Spec Grav, UA: 1.015 (ref 1.010–1.025)
Urobilinogen, UA: 0.2 E.U./dL
pH, UA: 6 (ref 5.0–8.0)

## 2023-06-05 LAB — VITAMIN B12: Vitamin B-12: 164 pg/mL — ABNORMAL LOW (ref 211–911)

## 2023-06-05 LAB — COMPREHENSIVE METABOLIC PANEL
ALT: 15 U/L (ref 0–35)
AST: 19 U/L (ref 0–37)
Albumin: 4.3 g/dL (ref 3.5–5.2)
Alkaline Phosphatase: 92 U/L (ref 39–117)
BUN: 11 mg/dL (ref 6–23)
CO2: 26 mEq/L (ref 19–32)
Calcium: 9.7 mg/dL (ref 8.4–10.5)
Chloride: 105 mEq/L (ref 96–112)
Creatinine, Ser: 0.76 mg/dL (ref 0.40–1.20)
GFR: 75.6 mL/min (ref >60.00)
Glucose, Bld: 90 mg/dL (ref 70–99)
Potassium: 3.8 mEq/L (ref 3.5–5.1)
Sodium: 138 mEq/L (ref 135–145)
Total Bilirubin: 0.4 mg/dL (ref 0.2–1.2)
Total Protein: 7.3 g/dL (ref 6.0–8.3)

## 2023-06-05 LAB — FOLATE: Folate: 10.5 ng/mL (ref >5.9)

## 2023-06-05 NOTE — Progress Notes (Signed)
New Patient Office Visit  Subjective    Patient ID: [name], female    DOB: 23-Nov-1946  Age: 77 y.o. MRN: [id number]  CC:  Chief Complaint  Patient presents with   Establish Care    Discomfort and pain in left side for about 2 weeks now. Sore.     HPI [name] presents to establish care. Her sister is with her.  Dr. Sharyn Lull- cardiologist  Dr. Kathi Ludwig- rheumatologist  Dr. Mosetta Putt- oncologist   States RA has been ruled out.   Lupus- dermatologist   Hx of breast cancer and removed in 2015.   IDA - sees Dr Josem Kaufmann a 2 -4 wk hx of left lower side pain. Pain is intermittent. Worse when getting out of bed.  Taking Tylenol and meloxicam.  Reports having increased gas.  Bowel movements are normal for her.  No fever, chills, N/V/D or constipation.      Outpatient Encounter Medications as of 06/05/2023  Medication Sig   amLODipine (NORVASC) 5 MG tablet Take 5 mg by mouth daily.   aspirin EC 81 MG EC tablet Take 1 tablet (81 mg total) by mouth daily.   meloxicam (MOBIC) 15 MG tablet Take 15 mg by mouth daily.   metoprolol succinate (TOPROL-XL) 50 MG 24 hr tablet Take 50 mg by mouth daily. Take with or immediately following a meal.   pantoprazole (PROTONIX) 40 MG tablet Take 40 mg by mouth daily.   ramipril (ALTACE) 10 MG capsule Take 10 mg by mouth 2 (two) times daily.   nitroGLYCERIN (NITROSTAT) 0.4 MG SL tablet Place 0.4 mg under the tongue every 5 (five) minutes as needed for chest pain. Reported on 01/11/2016 (Patient not taking: Reported on 06/05/2023)   [DISCONTINUED] ALPRAZolam (XANAX) 0.25 MG tablet Take 0.25 mg by mouth 2 (two) times daily as needed. (Patient not taking: Reported on 06/05/2023)   [DISCONTINUED] isosorbide mononitrate (IMDUR) 30 MG 24 hr tablet Take 30 mg by mouth daily.   [DISCONTINUED] KLOR-CON M20 20 MEQ tablet TAKE 1 TABLET BY MOUTH TWICE A DAY (Patient taking differently: Take 20 mEq by mouth 2 (two) times daily.)   [DISCONTINUED] ofloxacin  (OCUFLOX) 0.3 % ophthalmic solution Place 1 drop into the right eye 4 (four) times daily.   [DISCONTINUED] pravastatin (PRAVACHOL) 40 MG tablet Take 40 mg by mouth at bedtime.   [DISCONTINUED] prednisoLONE acetate (PRED FORTE) 1 % ophthalmic suspension Place 1 drop into the right eye 4 (four) times daily.   [DISCONTINUED] PROLENSA 0.07 % SOLN Place 1 drop into the right eye daily.   [DISCONTINUED] venlafaxine XR (EFFEXOR XR) 37.5 MG 24 hr capsule Take 1 capsule (37.5 mg total) by mouth daily with breakfast.   No facility-administered encounter medications on file as of 06/05/2023.    Past Medical History:  Diagnosis Date   Allergic rhinitis    Anginal pain (HCC)    Arthritis    Asthma    Breast cancer (HCC) 2015   Right Breast Cancer   Coronary artery disease    Emphysema of lung (HCC)    GERD (gastroesophageal reflux disease)    Hypertension    Lupus (systemic lupus erythematosus) (HCC)    Myocardial infarction [facility])     Past Surgical History:  Procedure Laterality Date   CORONARY STENT PLACEMENT  1995   MASTECTOMY Right 2015    Family History  Problem Relation Age of Onset   Allergies Mother    Heart disease Mother    Diabetes  Mother    Hypertension Mother    Cancer Father        Lung Cancer   Cancer Sister 40       lung cancer    Cancer Maternal Aunt 64       colon cancer    Cancer Maternal Aunt 33       uterine cancer     Social History   Socioeconomic History   Marital status: Divorced    Spouse name: Not on file   Number of children: Not on file   Years of education: 12   Highest education level: Not on file  Occupational History   Occupation: retired from the school systems    Employer: RETIRED    Comment: Geologist, engineering  Tobacco Use   Smoking status: Light Smoker    Types: Cigarettes    Last attempt to quit: 04/10/2014    Years since quitting: 9.1   Smokeless tobacco: Never   Tobacco comments:    2 cigarettes a week  Substance and Sexual  Activity   Alcohol use: Yes    Comment: occassional   Drug use: No   Sexual activity: Yes    Birth control/protection: Post-menopausal  Other Topics Concern   Not on file  Social History Narrative   Pt is divorced and lives alone.    Regular exercise-no   Caffeine Use-yes   Social Determinants of Health   Financial Resource Strain: Not on file  Food Insecurity: Not on file  Transportation Needs: Not on file  Physical Activity: Not on file  Stress: Not on file  Social Connections: Not on file  Intimate Partner Violence: Not on file    ROS      Objective    BP (!) 138/90 (BP Location: Left Arm, Patient Position: Sitting, Cuff Size: Large)   Pulse 70   Temp (!) 97.3 F (36.3 C) (Temporal)   Ht 5\' 6"  (1.676 m)   Wt 186 lb (84.4 kg)   SpO2 98%   BMI 30.02 kg/m   Physical Exam Constitutional:      General: She is not in acute distress.    Appearance: She is not ill-appearing.  HENT:     Mouth/Throat:     Mouth: Mucous membranes are moist.     Pharynx: Oropharynx is clear.  Eyes:     Extraocular Movements: Extraocular movements intact.     Conjunctiva/sclera: Conjunctivae normal.  Cardiovascular:     Rate and Rhythm: Normal rate and regular rhythm.  Pulmonary:     Effort: Pulmonary effort is normal.     Breath sounds: Normal breath sounds.  Abdominal:     General: Bowel sounds are normal. There is no distension.     Palpations: Abdomen is soft.     Tenderness: There is abdominal tenderness in the left lower quadrant. There is no right CVA tenderness, left CVA tenderness, guarding or rebound. Negative signs include Murphy's sign and McBurney's sign.  Musculoskeletal:        General: Normal range of motion.     Cervical back: Normal range of motion and neck supple.  Skin:    General: Skin is warm and dry.     Findings: No bruising or erythema.  Neurological:     General: No focal deficit present.     Mental Status: She is alert and oriented to person,  place, and time.     Cranial Nerves: No cranial nerve deficit.     Motor: No weakness.  Coordination: Coordination normal.     Gait: Gait normal.  Psychiatric:        Mood and Affect: Mood normal.        Behavior: Behavior normal.        Thought Content: Thought content normal.         Assessment & Plan:   Problem List Items Addressed This Visit       Cardiovascular and Mediastinum   Essential hypertension   Relevant Orders   CBC with Differential/Platelet (Completed)   Comprehensive metabolic panel (Completed)     Other   Iron deficiency anemia   Relevant Orders   CBC with Differential/Platelet (Completed)   Vitamin B12 (Completed)   Folate (Completed)   Iron, TIBC and Ferritin Panel   Other Visit Diagnoses     Left lower quadrant abdominal pain    -  Primary   Relevant Orders   CBC with Differential/Platelet (Completed)   Comprehensive metabolic panel (Completed)   POCT Urinalysis Dipstick (Automated) (Completed)   CT ABDOMEN PELVIS WO CONTRAST   Encounter to establish care       Left lower quadrant abdominal tenderness without rebound tenderness       Relevant Orders   CT ABDOMEN PELVIS WO CONTRAST      Discussed possible etiologies for pain including diverticulitis or MSK.  No red flag symptoms.  Continue follow up with specialists.  Check labs and I will see her back in 2-3 weeks.  I am requesting records from Dr. Sharyn Lull and GI office.   Return for f/u 2-3 wks for chronic health conditions .   Hetty Blend, NP-C

## 2023-06-05 NOTE — Patient Instructions (Addendum)
Please go downstairs for labs before you leave today.   You will hear from [facility] to schedule a CT scan of your abdomen.   Try taking an over the counter probiotic such as Align, Digestive Health or Culterelle.

## 2023-06-06 LAB — IRON,TIBC AND FERRITIN PANEL
%SAT: 7 % (calc) — ABNORMAL LOW (ref 16–45)
Ferritin: 7 ng/mL — ABNORMAL LOW (ref 16–288)
Iron: 28 ug/dL — ABNORMAL LOW (ref 45–160)
TIBC: 419 mcg/dL (calc) (ref 250–450)

## 2023-06-06 NOTE — Progress Notes (Signed)
Her vitamin B12 level is very low.  Recommend once weekly B12 injections x 4 weeks and then monthly.  Her iron is also low.  I recommend that she take over-the-counter oral iron if she is not already doing so.  She may need to take a stool softener with the iron due to the potential for constipation.  Otherwise, her labs are fine.

## 2023-06-13 ENCOUNTER — Ambulatory Visit (INDEPENDENT_AMBULATORY_CARE_PROVIDER_SITE_OTHER): Payer: Medicare PPO

## 2023-06-13 DIAGNOSIS — D509 Iron deficiency anemia, unspecified: Secondary | ICD-10-CM | POA: Diagnosis not present

## 2023-06-13 MED ORDER — CYANOCOBALAMIN 1000 MCG/ML IJ SOLN
1000.0000 ug | Freq: Once | INTRAMUSCULAR | Status: AC
  Administered 2023-06-13: 1000 ug via INTRAMUSCULAR

## 2023-06-13 NOTE — Progress Notes (Signed)
Pt here for monthly B12 injection per   B12 1000mcg given IM and pt tolerated injection well.   

## 2023-06-20 ENCOUNTER — Encounter: Payer: Self-pay | Admitting: Family Medicine

## 2023-06-21 ENCOUNTER — Ambulatory Visit: Payer: Medicare PPO

## 2023-06-21 ENCOUNTER — Ambulatory Visit (INDEPENDENT_AMBULATORY_CARE_PROVIDER_SITE_OTHER): Payer: Medicare PPO

## 2023-06-21 DIAGNOSIS — D509 Iron deficiency anemia, unspecified: Secondary | ICD-10-CM | POA: Diagnosis not present

## 2023-06-21 MED ORDER — CYANOCOBALAMIN 1000 MCG/ML IJ SOLN
1000.0000 ug | Freq: Once | INTRAMUSCULAR | Status: AC
  Administered 2023-06-21: 1000 ug via INTRAMUSCULAR

## 2023-06-21 NOTE — Progress Notes (Signed)
Pt here for monthly B12 injection per   B12 1000mcg given IM and pt tolerated injection well.    

## 2023-06-25 ENCOUNTER — Other Ambulatory Visit: Payer: Self-pay | Admitting: Family Medicine

## 2023-06-25 DIAGNOSIS — R1032 Left lower quadrant pain: Secondary | ICD-10-CM

## 2023-06-25 DIAGNOSIS — R10814 Left lower quadrant abdominal tenderness: Secondary | ICD-10-CM

## 2023-06-28 ENCOUNTER — Other Ambulatory Visit: Payer: Medicare PPO

## 2023-07-13 ENCOUNTER — Ambulatory Visit: Admission: RE | Admit: 2023-07-13 | Payer: Medicare PPO | Source: Ambulatory Visit

## 2023-07-13 DIAGNOSIS — R10814 Left lower quadrant abdominal tenderness: Secondary | ICD-10-CM

## 2023-07-13 DIAGNOSIS — R1032 Left lower quadrant pain: Secondary | ICD-10-CM

## 2023-07-20 NOTE — Progress Notes (Signed)
Her CT results are now available. Nothing seen on the CT to explain her pain. She has chronic conditions including diverticulosis of her colon, coronary artery disease (plaque buildup in her arteries), a large hiatal hernia, and fibroid in her uterus. I recall that she has a gastroenterologist which I recommend follow up with if not improving or to discuss the hernia. Please see how she is doing.

## 2023-07-23 ENCOUNTER — Ambulatory Visit: Payer: Medicare PPO

## 2023-07-24 ENCOUNTER — Other Ambulatory Visit: Payer: Self-pay

## 2023-07-24 NOTE — Telephone Encounter (Signed)
error 

## 2023-07-25 ENCOUNTER — Ambulatory Visit (INDEPENDENT_AMBULATORY_CARE_PROVIDER_SITE_OTHER): Payer: Medicare PPO

## 2023-07-25 DIAGNOSIS — E538 Deficiency of other specified B group vitamins: Secondary | ICD-10-CM

## 2023-07-25 MED ORDER — CYANOCOBALAMIN 1000 MCG/ML IJ SOLN
1000.0000 ug | Freq: Once | INTRAMUSCULAR | Status: AC
  Administered 2023-07-25: 1000 ug via INTRAMUSCULAR

## 2023-07-25 NOTE — Progress Notes (Signed)
After obtaining consent, and per orders of Henson Vickie NP, injection of B12 given by Grace P Wireko. Patient instructed to report any adverse reaction to me immediately.  

## 2023-08-07 ENCOUNTER — Ambulatory Visit: Payer: Medicare PPO | Admitting: Family Medicine

## 2023-08-07 ENCOUNTER — Encounter: Payer: Self-pay | Admitting: Family Medicine

## 2023-08-07 VITALS — BP 122/84 | HR 58 | Temp 97.8°F | Ht 66.0 in | Wt 186.0 lb

## 2023-08-07 DIAGNOSIS — I700 Atherosclerosis of aorta: Secondary | ICD-10-CM | POA: Diagnosis not present

## 2023-08-07 DIAGNOSIS — E538 Deficiency of other specified B group vitamins: Secondary | ICD-10-CM | POA: Diagnosis not present

## 2023-08-07 DIAGNOSIS — Z86010 Personal history of colonic polyps: Secondary | ICD-10-CM | POA: Diagnosis not present

## 2023-08-07 DIAGNOSIS — I2510 Atherosclerotic heart disease of native coronary artery without angina pectoris: Secondary | ICD-10-CM | POA: Diagnosis not present

## 2023-08-07 DIAGNOSIS — D509 Iron deficiency anemia, unspecified: Secondary | ICD-10-CM

## 2023-08-07 DIAGNOSIS — Z23 Encounter for immunization: Secondary | ICD-10-CM

## 2023-08-07 DIAGNOSIS — K449 Diaphragmatic hernia without obstruction or gangrene: Secondary | ICD-10-CM

## 2023-08-07 DIAGNOSIS — K219 Gastro-esophageal reflux disease without esophagitis: Secondary | ICD-10-CM

## 2023-08-07 LAB — CBC WITH DIFFERENTIAL/PLATELET
Basophils Absolute: 0.1 10*3/uL (ref 0.0–0.1)
Basophils Relative: 0.5 % (ref 0.0–3.0)
Eosinophils Absolute: 0.1 10*3/uL (ref 0.0–0.7)
Eosinophils Relative: 0.5 % (ref 0.0–5.0)
HCT: 41.3 % (ref 36.0–46.0)
Hemoglobin: 12.8 g/dL (ref 12.0–15.0)
Lymphocytes Relative: 41.4 % (ref 12.0–46.0)
Lymphs Abs: 4.8 10*3/uL — ABNORMAL HIGH (ref 0.7–4.0)
MCHC: 30.9 g/dL (ref 30.0–36.0)
MCV: 91 fl (ref 78.0–100.0)
Monocytes Absolute: 0.8 10*3/uL (ref 0.1–1.0)
Monocytes Relative: 6.4 % (ref 3.0–12.0)
Neutro Abs: 6 10*3/uL (ref 1.4–7.7)
Neutrophils Relative %: 51.2 % (ref 43.0–77.0)
Platelets: 401 10*3/uL — ABNORMAL HIGH (ref 150.0–400.0)
RBC: 4.54 Mil/uL (ref 3.87–5.11)
RDW: 17.4 % — ABNORMAL HIGH (ref 11.5–15.5)
WBC: 11.7 10*3/uL — ABNORMAL HIGH (ref 4.0–10.5)

## 2023-08-07 LAB — VITAMIN B12: Vitamin B-12: 484 pg/mL (ref 211–911)

## 2023-08-07 NOTE — Assessment & Plan Note (Signed)
Completed weekly B12 injections. Recheck B12 level and continue with monthly B12 injections.

## 2023-08-07 NOTE — Patient Instructions (Addendum)
Please follow up with your hematologist/oncologist Dr. Mosetta Putt.   I have referred you to [facility] and they will call you to schedule a visit.   Make sure you are taking your cholesterol medication.   Let us know if you find your vaccine records.

## 2023-08-07 NOTE — Assessment & Plan Note (Signed)
Seen on CT. Discussed significance of CAD and that I cannot tell her to what extent the disease is based on CT. She is not taking a statin. Reports having a statin at home and will restart medication.

## 2023-08-07 NOTE — Progress Notes (Signed)
Subjective:     Patient ID: [name], female    DOB: 31-Oct-1946, 77 y.o.   MRN: [id number]  Chief Complaint  Patient presents with   Follow-up    Check on scan and if needs to continue B12    HPI  Discussed the use of AI scribe software for clinical note transcription with the patient, who gave verbal consent to proceed.  History of Present Illness          Here to discuss abnormal findings on recent labs and CT abdomen/pelvis.   Taking iron again OTC with a stool softener.   She has received four B12 injections.   States her energy level has improved.   Other providers:  Dr. Sharyn Lull- cardiologist  Dr. Kathi Ludwig- rheumatologist  Dr. Mosetta Putt- oncologist   Last GI was [facility] - c/o GERD symptoms. Taking pantoprazole. States she is due for her next colonoscopy. States she has hx of polyps.    Lupus- treated by dermatologist  States RA has been ruled out.    Hx of breast cancer and removed in 2015.    IDA - sees Dr Mosetta Putt but canceled her last visit.     Health Maintenance Due  Topic Date Due   DTaP/Tdap/Td (1 - Tdap) Never done   Zoster Vaccines- Shingrix (1 of 2) Never done   Pneumonia Vaccine 26+ Years old (2 of 2 - PCV) 01/29/2014   Medicare Annual Wellness (AWV)  01/19/2015    Past Medical History:  Diagnosis Date   Allergic rhinitis    Anginal pain (HCC)    Arthritis    Asthma    Breast cancer (HCC) 2015   Right Breast Cancer   Coronary artery disease    Emphysema of lung (HCC)    GERD (gastroesophageal reflux disease)    Hypertension    Lupus (systemic lupus erythematosus) (HCC)    Myocardial infarction [facility])     Past Surgical History:  Procedure Laterality Date   CORONARY STENT PLACEMENT  1995   MASTECTOMY Right 2015    Family History  Problem Relation Age of Onset   Allergies Mother    Heart disease Mother    Diabetes Mother    Hypertension Mother    Cancer Father        Lung Cancer   Cancer Sister 24       lung cancer     Cancer Maternal Aunt 59       colon cancer    Cancer Maternal Aunt 16       uterine cancer     Social History   Socioeconomic History   Marital status: Divorced    Spouse name: Not on file   Number of children: Not on file   Years of education: 12   Highest education level: Not on file  Occupational History   Occupation: retired from the school systems    Employer: RETIRED    Comment: Geologist, engineering  Tobacco Use   Smoking status: Light Smoker    Current packs/day: 0.00    Types: Cigarettes    Last attempt to quit: 04/10/2014    Years since quitting: 9.3   Smokeless tobacco: Never   Tobacco comments:    2 cigarettes a week  Substance and Sexual Activity   Alcohol use: Yes    Comment: occassional   Drug use: No   Sexual activity: Yes    Birth control/protection: Post-menopausal  Other Topics Concern   Not on file  Social  History Narrative   Pt is divorced and lives alone.    Regular exercise-no   Caffeine Use-yes   Social Determinants of Health   Financial Resource Strain: Not on file  Food Insecurity: Not on file  Transportation Needs: Not on file  Physical Activity: Not on file  Stress: Not on file  Social Connections: Not on file  Intimate Partner Violence: Not on file    Outpatient Medications Prior to Visit  Medication Sig Dispense Refill   amLODipine (NORVASC) 5 MG tablet Take 5 mg by mouth daily.     aspirin EC 81 MG EC tablet Take 1 tablet (81 mg total) by mouth daily. 30 tablet 3   hydroxychloroquine (PLAQUENIL) 200 MG tablet Take 200 mg by mouth 2 (two) times daily.     meloxicam (MOBIC) 15 MG tablet Take 15 mg by mouth daily.     metoprolol succinate (TOPROL-XL) 50 MG 24 hr tablet Take 50 mg by mouth daily. Take with or immediately following a meal.     nitroGLYCERIN (NITROSTAT) 0.4 MG SL tablet Place 0.4 mg under the tongue every 5 (five) minutes as needed for chest pain. Reported on 01/11/2016     pantoprazole (PROTONIX) 40 MG tablet Take 40 mg by  mouth daily.  3   predniSONE (DELTASONE) 10 MG tablet PLEASE SEE ATTACHED FOR DETAILED DIRECTIONS     ramipril (ALTACE) 10 MG capsule Take 10 mg by mouth 2 (two) times daily.     No facility-administered medications prior to visit.    Allergies  Allergen Reactions   Codeine Other (See Comments)   Lipitor [Atorvastatin] Other (See Comments)    Pt states med makes her feel "loopy"    Review of Systems  Constitutional:  Negative for chills, fever, malaise/fatigue and weight loss.  Respiratory:  Negative for shortness of breath.   Cardiovascular:  Negative for chest pain, palpitations and leg swelling.  Gastrointestinal:  Positive for heartburn. Negative for abdominal pain, constipation, diarrhea, nausea and vomiting.  Genitourinary:  Negative for dysuria, frequency and urgency.  Neurological:  Negative for dizziness, sensory change, focal weakness and headaches.       Objective:    Physical Exam Constitutional:      General: She is not in acute distress.    Appearance: She is not ill-appearing.  Eyes:     Extraocular Movements: Extraocular movements intact.     Conjunctiva/sclera: Conjunctivae normal.  Cardiovascular:     Rate and Rhythm: Normal rate.  Pulmonary:     Effort: Pulmonary effort is normal.  Musculoskeletal:     Cervical back: Normal range of motion and neck supple.  Skin:    General: Skin is warm and dry.  Neurological:     General: No focal deficit present.     Mental Status: She is alert and oriented to person, place, and time.  Psychiatric:        Mood and Affect: Mood normal.        Behavior: Behavior normal.        Thought Content: Thought content normal.      BP 122/84 (BP Location: Left Arm, Patient Position: Sitting, Cuff Size: Large)   Pulse (!) 58   Temp 97.8 F (36.6 C) (Temporal)   Ht 5\' 6"  (1.676 m)   Wt 186 lb (84.4 kg)   SpO2 100%   BMI 30.02 kg/m  Wt Readings from Last 3 Encounters:  08/07/23 186 lb (84.4 kg)  06/05/23 186 lb  (84.4 kg)  05/17/21  172 lb (78 kg)       Assessment & Plan:   Problem List Items Addressed This Visit       Cardiovascular and Mediastinum   Aortic atherosclerosis (HCC)    Seen on CT. Discussed significance of diagnosis and that I cannot tell her to what extent the disease is based on CT. Advised increased risk of heart attack and stroke. She is not taking a statin. Reports having a statin at home and will restart medication.       Coronary artery disease    Seen on CT. Discussed significance of CAD and that I cannot tell her to what extent the disease is based on CT. She is not taking a statin. Reports having a statin at home and will restart medication.         Respiratory   Hiatal hernia with GERD    Reviewed CT results. She does not want to go back to [facility]. Lives in [place]. Requests referral to [facility]. Continue pantoprazole. Recommend eating small amounts. Avoid eating and laying down. Avoid triggers.       Relevant Orders   Ambulatory referral to Gastroenterology     Other   B12 deficiency - Primary    Completed weekly B12 injections. Recheck B12 level and continue with monthly B12 injections.       Relevant Orders   CBC with Differential/Platelet (Completed)   Iron, TIBC and Ferritin Panel   Vitamin B12 (Completed)   History of colonic polyps   Relevant Orders   Ambulatory referral to Gastroenterology   Iron deficiency anemia    Continue oral iron. Advised her to call and schedule follow up with Dr. Mosetta Putt      Relevant Orders   CBC with Differential/Platelet (Completed)   Iron, TIBC and Ferritin Panel   Vitamin B12 (Completed)   Other Visit Diagnoses     Need for influenza vaccination       Relevant Orders   Flu Vaccine Trivalent High Dose (Fluad) (Completed)       I am having Candy Sledge. Mataya maintain her ramipril, nitroGLYCERIN, aspirin EC, metoprolol succinate, pantoprazole, amLODipine, meloxicam, predniSONE, and  hydroxychloroquine.  No orders of the defined types were placed in this encounter.

## 2023-08-07 NOTE — Assessment & Plan Note (Signed)
Reviewed CT results. She does not want to go back to [facility]. Lives in [place]. Requests referral to [facility]. Continue pantoprazole. Recommend eating small amounts. Avoid eating and laying down. Avoid triggers.

## 2023-08-07 NOTE — Assessment & Plan Note (Signed)
Seen on CT. Discussed significance of diagnosis and that I cannot tell her to what extent the disease is based on CT. Advised increased risk of heart attack and stroke. She is not taking a statin. Reports having a statin at home and will restart medication.

## 2023-08-07 NOTE — Assessment & Plan Note (Signed)
Continue oral iron. Advised her to call and schedule follow up with Dr. Mosetta Putt

## 2023-08-08 LAB — IRON,TIBC AND FERRITIN PANEL
%SAT: 7 % — ABNORMAL LOW (ref 16–45)
Ferritin: 12 ng/mL — ABNORMAL LOW (ref 16–288)
Iron: 29 ug/dL — ABNORMAL LOW (ref 45–160)
TIBC: 403 ug/dL (ref 250–450)

## 2023-08-08 NOTE — Progress Notes (Signed)
Her labs are fairly stable. Continue iron over the counter until she follows up with Dr. Mosetta Putt. Also, I recommend monthly B12 injections going forward. She should also take an OTC multi vitamin. One of her white blood cells is elevated and I would like to recheck this. Follow up ov in 3 months.

## 2023-08-22 ENCOUNTER — Other Ambulatory Visit: Payer: Self-pay | Admitting: Hematology

## 2023-08-22 DIAGNOSIS — Z1231 Encounter for screening mammogram for malignant neoplasm of breast: Secondary | ICD-10-CM

## 2023-09-07 ENCOUNTER — Ambulatory Visit: Payer: Medicare PPO

## 2023-09-10 ENCOUNTER — Ambulatory Visit (INDEPENDENT_AMBULATORY_CARE_PROVIDER_SITE_OTHER): Payer: Medicare PPO

## 2023-09-10 DIAGNOSIS — E538 Deficiency of other specified B group vitamins: Secondary | ICD-10-CM

## 2023-09-10 MED ORDER — CYANOCOBALAMIN 1000 MCG/ML IJ SOLN
1000.0000 ug | Freq: Once | INTRAMUSCULAR | Status: AC
Start: 2023-09-10 — End: 2023-09-10
  Administered 2023-09-10: 1000 ug via INTRAMUSCULAR

## 2023-09-10 NOTE — Progress Notes (Signed)
After obtaining consent, and per orders of [facility] NP-C, injection of B12 was given by Ferdie Ping. Patient instructed to report any adverse reaction to me immediately.

## 2023-09-24 ENCOUNTER — Ambulatory Visit
Admission: RE | Admit: 2023-09-24 | Discharge: 2023-09-24 | Disposition: A | Discharge status: Home / Self Care | Payer: Medicare PPO | Source: Ambulatory Visit | Attending: Hematology | Admitting: Hematology

## 2023-09-24 DIAGNOSIS — Z1231 Encounter for screening mammogram for malignant neoplasm of breast: Secondary | ICD-10-CM

## 2023-10-10 ENCOUNTER — Ambulatory Visit (INDEPENDENT_AMBULATORY_CARE_PROVIDER_SITE_OTHER): Payer: Medicare PPO

## 2023-10-10 DIAGNOSIS — E538 Deficiency of other specified B group vitamins: Secondary | ICD-10-CM | POA: Diagnosis not present

## 2023-10-10 MED ORDER — CYANOCOBALAMIN 1000 MCG/ML IJ SOLN
1000.0000 ug | Freq: Once | INTRAMUSCULAR | Status: AC
Start: 2023-10-10 — End: 2023-10-10
  Administered 2023-10-10: 1000 ug via INTRAMUSCULAR

## 2023-10-10 NOTE — Progress Notes (Signed)
Pt was given B12 injection with no complications.

## 2023-10-31 ENCOUNTER — Telehealth: Payer: Self-pay | Admitting: Family Medicine

## 2023-10-31 ENCOUNTER — Ambulatory Visit: Payer: Medicare PPO | Admitting: Family Medicine

## 2023-10-31 ENCOUNTER — Encounter: Payer: Self-pay | Admitting: Family Medicine

## 2023-10-31 VITALS — BP 136/86 | HR 82 | Temp 97.6°F | Ht 66.0 in | Wt 190.0 lb

## 2023-10-31 DIAGNOSIS — R200 Anesthesia of skin: Secondary | ICD-10-CM

## 2023-10-31 DIAGNOSIS — L932 Other local lupus erythematosus: Secondary | ICD-10-CM

## 2023-10-31 DIAGNOSIS — I10 Essential (primary) hypertension: Secondary | ICD-10-CM | POA: Diagnosis not present

## 2023-10-31 DIAGNOSIS — I2510 Atherosclerotic heart disease of native coronary artery without angina pectoris: Secondary | ICD-10-CM

## 2023-10-31 DIAGNOSIS — Z23 Encounter for immunization: Secondary | ICD-10-CM

## 2023-10-31 DIAGNOSIS — K449 Diaphragmatic hernia without obstruction or gangrene: Secondary | ICD-10-CM

## 2023-10-31 DIAGNOSIS — I700 Atherosclerosis of aorta: Secondary | ICD-10-CM

## 2023-10-31 DIAGNOSIS — K219 Gastro-esophageal reflux disease without esophagitis: Secondary | ICD-10-CM

## 2023-10-31 DIAGNOSIS — R202 Paresthesia of skin: Secondary | ICD-10-CM | POA: Diagnosis not present

## 2023-10-31 DIAGNOSIS — Z789 Other specified health status: Secondary | ICD-10-CM

## 2023-10-31 DIAGNOSIS — D509 Iron deficiency anemia, unspecified: Secondary | ICD-10-CM

## 2023-10-31 DIAGNOSIS — E538 Deficiency of other specified B group vitamins: Secondary | ICD-10-CM

## 2023-10-31 LAB — COMPREHENSIVE METABOLIC PANEL
ALT: 14 U/L (ref 0–35)
AST: 17 U/L (ref 0–37)
Albumin: 4.2 g/dL (ref 3.5–5.2)
Alkaline Phosphatase: 94 U/L (ref 39–117)
BUN: 15 mg/dL (ref 6–23)
CO2: 26 meq/L (ref 19–32)
Calcium: 9.6 mg/dL (ref 8.4–10.5)
Chloride: 104 meq/L (ref 96–112)
Creatinine, Ser: 0.92 mg/dL (ref 0.40–1.20)
GFR: 59.94 mL/min — ABNORMAL LOW (ref >60.00)
Glucose, Bld: 91 mg/dL (ref 70–99)
Potassium: 3.6 meq/L (ref 3.5–5.1)
Sodium: 137 meq/L (ref 135–145)
Total Bilirubin: 0.2 mg/dL (ref 0.2–1.2)
Total Protein: 6.8 g/dL (ref 6.0–8.3)

## 2023-10-31 LAB — CBC WITH DIFFERENTIAL/PLATELET
Basophils Absolute: 0 10*3/uL (ref 0.0–0.1)
Basophils Relative: 0.4 % (ref 0.0–3.0)
Eosinophils Absolute: 0 10*3/uL (ref 0.0–0.7)
Eosinophils Relative: 0.2 % (ref 0.0–5.0)
HCT: 29.8 % — ABNORMAL LOW (ref 36.0–46.0)
Hemoglobin: 9.3 g/dL — ABNORMAL LOW (ref 12.0–15.0)
Lymphocytes Relative: 10 % — ABNORMAL LOW (ref 12.0–46.0)
Lymphs Abs: 1.2 10*3/uL (ref 0.7–4.0)
MCHC: 31.2 g/dL (ref 30.0–36.0)
MCV: 85.4 fL (ref 78.0–100.0)
Monocytes Absolute: 0.5 10*3/uL (ref 0.1–1.0)
Monocytes Relative: 4.7 % (ref 3.0–12.0)
Neutro Abs: 9.9 10*3/uL — ABNORMAL HIGH (ref 1.4–7.7)
Neutrophils Relative %: 84.7 % — ABNORMAL HIGH (ref 43.0–77.0)
Platelets: 514 10*3/uL — ABNORMAL HIGH (ref 150.0–400.0)
RBC: 3.49 Mil/uL — ABNORMAL LOW (ref 3.87–5.11)
RDW: 14.9 % (ref 11.5–15.5)
WBC: 11.7 10*3/uL — ABNORMAL HIGH (ref 4.0–10.5)

## 2023-10-31 LAB — T4, FREE: Free T4: 0.69 ng/dL (ref 0.60–1.60)

## 2023-10-31 LAB — LIPID PANEL
Cholesterol: 158 mg/dL (ref 0–200)
HDL: 50.7 mg/dL (ref >39.00)
LDL Cholesterol: 64 mg/dL (ref 0–99)
NonHDL: 106.94
Total CHOL/HDL Ratio: 3
Triglycerides: 213 mg/dL — ABNORMAL HIGH (ref 0.0–149.0)
VLDL: 42.6 mg/dL — ABNORMAL HIGH (ref 0.0–40.0)

## 2023-10-31 LAB — FERRITIN: Ferritin: 13.5 ng/mL (ref 10.0–291.0)

## 2023-10-31 LAB — VITAMIN B12: Vitamin B-12: >1537 pg/mL — ABNORMAL HIGH (ref 211–911)

## 2023-10-31 LAB — TSH: TSH: 0.76 u[IU]/mL (ref 0.35–5.50)

## 2023-10-31 NOTE — Assessment & Plan Note (Signed)
Reports restarting Plaquenil 2 weeks ago.  She took her self off of the medication.  Recommend she follow-up with dermatology as recommended.

## 2023-10-31 NOTE — Telephone Encounter (Signed)
Patient called to inform her PCP that she is taking atorvastatin 20 mg 1X a day.

## 2023-10-31 NOTE — Telephone Encounter (Signed)
Fyi.. chart has been updated

## 2023-10-31 NOTE — Progress Notes (Signed)
Her anemia is much worse since August and this is concerning. Please ask her to call and schedule with her hematologist, Dr. Latanya Maudlin office, as soon as possible. Her vitamin B12 is actually high. Ok to continue oral B12 and monthly B12 injections.

## 2023-10-31 NOTE — Assessment & Plan Note (Signed)
Not at goal. Reports good compliance with medications. She will see Dr. Sharyn Lull next week and discuss treatment plan. Check renal function.

## 2023-10-31 NOTE — Assessment & Plan Note (Signed)
Seen on CT. Discussed significance of CAD and that I cannot tell her to what extent the disease is based on CT. Reports having restarted her statin.  Check lipids today

## 2023-10-31 NOTE — Assessment & Plan Note (Signed)
Improved after restarting oral B12. Check B12 level today

## 2023-10-31 NOTE — Patient Instructions (Addendum)
Get the Tdap (tetanus vaccine) at your pharmacy.   Please check with Dr. Sharyn Lull regarding your cholesterol medication and blood pressure. Ask his office to send me office notes and results.

## 2023-10-31 NOTE — Assessment & Plan Note (Signed)
Seen on CT. Discussed significance of diagnosis and that I cannot tell her to what extent the disease is based on CT. Advised increased risk of heart attack and stroke.  Reports restarting statin but unclear which one she is taking.  She will call back to let me know.  She will also discuss this with her cardiologist next week.

## 2023-10-31 NOTE — Progress Notes (Signed)
Subjective:     Patient ID: [name], female    DOB: 12/19/1945, 77 y.o.   MRN: [id number]  Chief Complaint  Patient presents with   Medical Management of Chronic Issues    3 month f/u -recheck WBC\ Wants B12 rechecked    HPI   History of Present Illness          She is here for follow up on chronic health conditions.    Dr. Sharyn Lull- cardiologist  Dr. Kathi Ludwig- rheumatologist  Dr. Mosetta Putt- oncologist    States RA has been ruled out.   Sees cardiologist next week.   CAD and aortic atherosclerosis. No recent lipid panel. States she is taking a statin. Unclear which one    Lupus- dermatologist treats her. States she just restarted her Plaquenil 2 weeks ago. She took herself off for a few weeks.    Hx of breast cancer and surgery in 2015.    IDA - sees Dr Mosetta Putt but has not followed up recently   Taking oral iron and B12   C/o right foot numbness and soreness which improved once she started taking B12.   She started back on her lupus medication 2 months ago   Mammogram done and normal   Smoking 3 cigarettes per day still.    Health Maintenance Due  Topic Date Due   DTaP/Tdap/Td (1 - Tdap) Never done   Medicare Annual Wellness (AWV)  01/19/2015    Past Medical History:  Diagnosis Date   Allergic rhinitis    Anginal pain (HCC)    Arthritis    Asthma    Breast cancer (HCC) 2015   Right Breast Cancer   Coronary artery disease    Emphysema of lung (HCC)    GERD (gastroesophageal reflux disease)    Hypertension    Lupus (systemic lupus erythematosus) (HCC)    Myocardial infarction [facility])     Past Surgical History:  Procedure Laterality Date   CORONARY STENT PLACEMENT  1995   MASTECTOMY Right 2015    Family History  Problem Relation Age of Onset   Allergies Mother    Heart disease Mother    Diabetes Mother    Hypertension Mother    Cancer Father        Lung Cancer   Cancer Sister 40       lung cancer    Cancer Maternal Aunt 61       colon  cancer    Cancer Maternal Aunt 19       uterine cancer     Social History   Socioeconomic History   Marital status: Divorced    Spouse name: Not on file   Number of children: Not on file   Years of education: 12   Highest education level: Not on file  Occupational History   Occupation: retired from the school systems    Employer: RETIRED    Comment: Geologist, engineering  Tobacco Use   Smoking status: Light Smoker    Current packs/day: 0.00    Types: Cigarettes    Last attempt to quit: 04/10/2014    Years since quitting: 9.5   Smokeless tobacco: Never   Tobacco comments:    2 cigarettes a week  Substance and Sexual Activity   Alcohol use: Yes    Comment: occassional   Drug use: No   Sexual activity: Yes    Birth control/protection: Post-menopausal  Other Topics Concern   Not on file  Social History Narrative  Pt is divorced and lives alone.    Regular exercise-no   Caffeine Use-yes   Social Determinants of Health   Financial Resource Strain: Not on file  Food Insecurity: Not on file  Transportation Needs: Not on file  Physical Activity: Not on file  Stress: Not on file  Social Connections: Not on file  Intimate Partner Violence: Not on file    Outpatient Medications Prior to Visit  Medication Sig Dispense Refill   amLODipine (NORVASC) 5 MG tablet Take 5 mg by mouth daily.     aspirin EC 81 MG EC tablet Take 1 tablet (81 mg total) by mouth daily. 30 tablet 3   hydroxychloroquine (PLAQUENIL) 200 MG tablet Take 200 mg by mouth 2 (two) times daily.     meloxicam (MOBIC) 15 MG tablet Take 15 mg by mouth daily.     metoprolol succinate (TOPROL-XL) 50 MG 24 hr tablet Take 50 mg by mouth daily. Take with or immediately following a meal.     nitroGLYCERIN (NITROSTAT) 0.4 MG SL tablet Place 0.4 mg under the tongue every 5 (five) minutes as needed for chest pain. Reported on 01/11/2016     pantoprazole (PROTONIX) 40 MG tablet Take 40 mg by mouth daily.  3   predniSONE  (DELTASONE) 10 MG tablet PLEASE SEE ATTACHED FOR DETAILED DIRECTIONS     ramipril (ALTACE) 10 MG capsule Take 10 mg by mouth 2 (two) times daily.     No facility-administered medications prior to visit.    Allergies  Allergen Reactions   Codeine Other (See Comments)   Lipitor [Atorvastatin] Other (See Comments)    Pt states med makes her feel "loopy"    Review of Systems  Constitutional:  Negative for chills, fever and malaise/fatigue.  Eyes:  Negative for blurred vision and double vision.  Respiratory:  Negative for shortness of breath.   Cardiovascular:  Negative for chest pain, palpitations and leg swelling.  Gastrointestinal:  Negative for abdominal pain, constipation, diarrhea, nausea and vomiting.  Genitourinary:  Negative for dysuria, frequency and urgency.  Musculoskeletal:  Negative for joint pain.  Neurological:  Positive for tingling. Negative for dizziness and focal weakness.       Resolved right foot tingling        Objective:    Physical Exam Constitutional:      General: She is not in acute distress.    Appearance: She is not ill-appearing.  Eyes:     Extraocular Movements: Extraocular movements intact.     Conjunctiva/sclera: Conjunctivae normal.  Cardiovascular:     Rate and Rhythm: Normal rate.  Pulmonary:     Effort: Pulmonary effort is normal.  Musculoskeletal:     Cervical back: Normal range of motion and neck supple.  Skin:    General: Skin is warm and dry.  Neurological:     General: No focal deficit present.     Mental Status: She is alert and oriented to person, place, and time.  Psychiatric:        Mood and Affect: Mood normal.        Behavior: Behavior normal.        Thought Content: Thought content normal.      BP 136/86 (BP Location: Left Arm, Patient Position: Sitting, Cuff Size: Large)   Pulse 82   Temp 97.6 F (36.4 C) (Temporal)   Ht 5\' 6"  (1.676 m)   Wt 190 lb (86.2 kg)   SpO2 100%   BMI 30.67 kg/m  Wt Readings from  Last  3 Encounters:  10/31/23 190 lb (86.2 kg)  08/07/23 186 lb (84.4 kg)  06/05/23 186 lb (84.4 kg)       Assessment & Plan:   Problem List Items Addressed This Visit     Aortic atherosclerosis (HCC)    Seen on CT. Discussed significance of diagnosis and that I cannot tell her to what extent the disease is based on CT. Advised increased risk of heart attack and stroke.  Reports restarting statin but unclear which one she is taking.  She will call back to let me know.  She will also discuss this with her cardiologist next week.      Relevant Orders   Lipid panel   B12 deficiency - Primary    Taking oral B12 and getting monthly B12 injections.  Check vitamin B12 level      Relevant Orders   Vitamin B12   Coronary artery disease    Seen on CT. Discussed significance of CAD and that I cannot tell her to what extent the disease is based on CT. Reports having restarted her statin.  Check lipids today      Relevant Orders   Lipid panel   Cutaneous lupus erythematosus    Reports restarting Plaquenil 2 weeks ago.  She took her self off of the medication.  Recommend she follow-up with dermatology as recommended.      Essential hypertension    Not at goal. Reports good compliance with medications. She will see Dr. Sharyn Lull next week and discuss treatment plan. Check renal function.       Relevant Orders   CBC with Differential/Platelet   Comprehensive metabolic panel   Hiatal hernia with GERD    Reviewed CT results. She does not want to go back to [facility]. She has been referred to [facility]. Continue pantoprazole. Recommend eating small amounts. Avoid eating and laying down. Avoid triggers.       Iron deficiency anemia    Continue oral iron. Check CBC and ferritin. Advised her to call and schedule follow up with Dr. Mosetta Putt      Relevant Orders   CBC with Differential/Platelet   Ferritin   Numbness and tingling of foot    Improved after restarting oral B12. Check B12 level today        Relevant Orders   TSH   T4, free   Vitamin B12   Ferritin   Other Visit Diagnoses     Need for pneumococcal 20-valent conjugate vaccination       Relevant Orders   Pneumococcal conjugate vaccine 20-valent (Prevnar 20) (Completed)   Statin intolerance           I am having [name]. Labarge maintain her ramipril, nitroGLYCERIN, aspirin EC, metoprolol succinate, pantoprazole, amLODipine, meloxicam, predniSONE, and hydroxychloroquine.  No orders of the defined types were placed in this encounter.

## 2023-10-31 NOTE — Assessment & Plan Note (Signed)
Reviewed CT results. She does not want to go back to [facility]. She has been referred to [facility]. Continue pantoprazole. Recommend eating small amounts. Avoid eating and laying down. Avoid triggers.

## 2023-10-31 NOTE — Assessment & Plan Note (Signed)
Taking oral B12 and getting monthly B12 injections.  Check vitamin B12 level

## 2023-10-31 NOTE — Assessment & Plan Note (Signed)
Continue oral iron. Check CBC and ferritin. Advised her to call and schedule follow up with Dr. Mosetta Putt

## 2023-11-12 ENCOUNTER — Ambulatory Visit: Payer: Medicare PPO

## 2023-11-19 ENCOUNTER — Ambulatory Visit: Payer: Medicare PPO

## 2023-11-19 DIAGNOSIS — E538 Deficiency of other specified B group vitamins: Secondary | ICD-10-CM

## 2023-11-19 MED ORDER — CYANOCOBALAMIN 1000 MCG/ML IJ SOLN
1000.0000 ug | Freq: Once | INTRAMUSCULAR | Status: AC
  Administered 2023-11-19: 1000 ug via INTRAMUSCULAR

## 2023-11-19 NOTE — Progress Notes (Cosign Needed Addendum)
 PT visits today for their b-12 injection. PT informed of what they were receiving and tolerated injection well. PT notified to reach out to office if needed.  Medical screening examination/treatment/procedure(s) were performed by non-physician practitioner and as supervising physician I was immediately available for consultation/collaboration.  I agree with above. Jacinta Shoe, MD

## 2023-12-20 ENCOUNTER — Ambulatory Visit: Payer: Medicare PPO

## 2023-12-20 ENCOUNTER — Telehealth: Payer: Self-pay

## 2023-12-20 NOTE — Telephone Encounter (Signed)
 Copied from CRM [id number]. Topic: Appointments - Scheduling Inquiry for Clinic >> Dec 20, 2023  8:44 AM [name] wrote: Reason for CRM: Patient has an appointment today for b12 injection needs to be reschedule or sometime next week is requesting a callback to be rescheduled

## 2023-12-26 ENCOUNTER — Ambulatory Visit (INDEPENDENT_AMBULATORY_CARE_PROVIDER_SITE_OTHER): Payer: Medicare PPO

## 2023-12-26 DIAGNOSIS — E538 Deficiency of other specified B group vitamins: Secondary | ICD-10-CM | POA: Diagnosis not present

## 2023-12-26 MED ORDER — CYANOCOBALAMIN 1000 MCG/ML IJ SOLN
1000.0000 ug | Freq: Once | INTRAMUSCULAR | Status: AC
  Administered 2023-12-26: 1000 ug via INTRAMUSCULAR

## 2023-12-26 NOTE — Progress Notes (Signed)
Pt was given B12 injection with no complications.

## 2024-01-31 ENCOUNTER — Ambulatory Visit: Payer: Medicare PPO | Admitting: Family Medicine

## 2024-02-06 ENCOUNTER — Ambulatory Visit: Payer: Medicare PPO | Admitting: Family Medicine

## 2024-02-06 ENCOUNTER — Encounter: Payer: Self-pay | Admitting: Family Medicine

## 2024-02-06 VITALS — BP 132/84 | HR 70 | Temp 97.8°F | Ht 66.0 in | Wt 194.0 lb

## 2024-02-06 DIAGNOSIS — L932 Other local lupus erythematosus: Secondary | ICD-10-CM | POA: Diagnosis not present

## 2024-02-06 DIAGNOSIS — I700 Atherosclerosis of aorta: Secondary | ICD-10-CM | POA: Diagnosis not present

## 2024-02-06 DIAGNOSIS — Z860100 Personal history of colon polyps, unspecified: Secondary | ICD-10-CM

## 2024-02-06 DIAGNOSIS — D509 Iron deficiency anemia, unspecified: Secondary | ICD-10-CM

## 2024-02-06 DIAGNOSIS — I2510 Atherosclerotic heart disease of native coronary artery without angina pectoris: Secondary | ICD-10-CM

## 2024-02-06 DIAGNOSIS — E538 Deficiency of other specified B group vitamins: Secondary | ICD-10-CM

## 2024-02-06 DIAGNOSIS — L930 Discoid lupus erythematosus: Secondary | ICD-10-CM

## 2024-02-06 DIAGNOSIS — D75839 Thrombocytosis, unspecified: Secondary | ICD-10-CM | POA: Diagnosis not present

## 2024-02-06 DIAGNOSIS — K449 Diaphragmatic hernia without obstruction or gangrene: Secondary | ICD-10-CM

## 2024-02-06 DIAGNOSIS — J439 Emphysema, unspecified: Secondary | ICD-10-CM

## 2024-02-06 DIAGNOSIS — K219 Gastro-esophageal reflux disease without esophagitis: Secondary | ICD-10-CM

## 2024-02-06 DIAGNOSIS — I10 Essential (primary) hypertension: Secondary | ICD-10-CM | POA: Diagnosis not present

## 2024-02-06 DIAGNOSIS — M542 Cervicalgia: Secondary | ICD-10-CM

## 2024-02-06 LAB — COMPREHENSIVE METABOLIC PANEL
ALT: 10 U/L (ref 0–35)
AST: 16 U/L (ref 0–37)
Albumin: 4.2 g/dL (ref 3.5–5.2)
Alkaline Phosphatase: 94 U/L (ref 39–117)
BUN: 14 mg/dL (ref 6–23)
CO2: 23 meq/L (ref 19–32)
Calcium: 9.1 mg/dL (ref 8.4–10.5)
Chloride: 107 meq/L (ref 96–112)
Creatinine, Ser: 0.88 mg/dL (ref 0.40–1.20)
GFR: 63.1 mL/min (ref >60.00)
Glucose, Bld: 100 mg/dL — ABNORMAL HIGH (ref 70–99)
Potassium: 4.4 meq/L (ref 3.5–5.1)
Sodium: 138 meq/L (ref 135–145)
Total Bilirubin: 0.3 mg/dL (ref 0.2–1.2)
Total Protein: 7.2 g/dL (ref 6.0–8.3)

## 2024-02-06 LAB — CBC WITH DIFFERENTIAL/PLATELET
Basophils Absolute: 0.1 10*3/uL (ref 0.0–0.1)
Basophils Relative: 1.2 % (ref 0.0–3.0)
Eosinophils Absolute: 0.1 10*3/uL (ref 0.0–0.7)
Eosinophils Relative: 2.1 % (ref 0.0–5.0)
HCT: 32 % — ABNORMAL LOW (ref 36.0–46.0)
Hemoglobin: 9.4 g/dL — ABNORMAL LOW (ref 12.0–15.0)
Lymphocytes Relative: 21.3 % (ref 12.0–46.0)
Lymphs Abs: 1.4 10*3/uL (ref 0.7–4.0)
MCHC: 29.5 g/dL — ABNORMAL LOW (ref 30.0–36.0)
MCV: 75.3 fL — ABNORMAL LOW (ref 78.0–100.0)
Monocytes Absolute: 0.4 10*3/uL (ref 0.1–1.0)
Monocytes Relative: 6.4 % (ref 3.0–12.0)
Neutro Abs: 4.6 10*3/uL (ref 1.4–7.7)
Neutrophils Relative %: 69 % (ref 43.0–77.0)
Platelets: 414 10*3/uL — ABNORMAL HIGH (ref 150.0–400.0)
RBC: 4.25 Mil/uL (ref 3.87–5.11)
RDW: 17.7 % — ABNORMAL HIGH (ref 11.5–15.5)
WBC: 6.6 10*3/uL (ref 4.0–10.5)

## 2024-02-06 LAB — FOLATE: Folate: 11.8 ng/mL (ref >5.9)

## 2024-02-06 LAB — FERRITIN: Ferritin: 7.9 ng/mL — ABNORMAL LOW (ref 10.0–291.0)

## 2024-02-06 LAB — VITAMIN B12: Vitamin B-12: >1537 pg/mL — ABNORMAL HIGH (ref 211–911)

## 2024-02-06 MED ORDER — CYANOCOBALAMIN 1000 MCG/ML IJ SOLN
1000.0000 ug | Freq: Once | INTRAMUSCULAR | Status: AC
Start: 2024-02-06 — End: 2024-02-06
  Administered 2024-02-06: 1000 ug via INTRAMUSCULAR

## 2024-02-06 NOTE — Progress Notes (Signed)
 Subjective:     Patient ID: [name], female    DOB: 1946/03/16, 78 y.o.   MRN: [id number]  Chief Complaint  Patient presents with   Medical Management of Chronic Issues    3 month f/u    HPI   History of Present Illness         Dr. Sharyn Lull- cardiologist  Dr. Kathi Ludwig- rheumatologist  Dr. Mosetta Putt- oncologist    States she needs a new referral to Dr. Mosetta Putt for IDA.    Reports taking oral iron and B12   States she is taking her lupus medication regularly.   C/o a 2 month hx of intermittent left sided neck pain that feels like pressure. She notices it when her head is bent forward.   Reports family hx of squamous cell cancer of neck    Reports worsening GERD and hernia discomfort. Would like new referral to GI.  Reports hx of polyps.   Health Maintenance Due  Topic Date Due   DTaP/Tdap/Td (1 - Tdap) Never done   Zoster Vaccines- Shingrix (1 of 2) Never done   Medicare Annual Wellness (AWV)  01/19/2015   COVID-19 Vaccine (4 - 2024-25 season) 08/12/2023    Past Medical History:  Diagnosis Date   Allergic rhinitis    Anginal pain (HCC)    Arthritis    Asthma    Breast cancer (HCC) 2015   Right Breast Cancer   Coronary artery disease    Emphysema of lung (HCC)    GERD (gastroesophageal reflux disease)    Hypertension    Lupus (systemic lupus erythematosus) (HCC)    Myocardial infarction [facility])     Past Surgical History:  Procedure Laterality Date   CORONARY STENT PLACEMENT  1995   MASTECTOMY Right 2015    Family History  Problem Relation Age of Onset   Allergies Mother    Heart disease Mother    Diabetes Mother    Hypertension Mother    Cancer Father        Lung Cancer   Cancer Sister 38       lung cancer    Cancer Maternal Aunt 83       colon cancer    Cancer Maternal Aunt 74       uterine cancer     Social History   Socioeconomic History   Marital status: Divorced    Spouse name: Not on file   Number of children: Not on file   Years  of education: 12   Highest education level: Not on file  Occupational History   Occupation: retired from the school systems    Employer: RETIRED    Comment: Geologist, engineering  Tobacco Use   Smoking status: Light Smoker    Current packs/day: 0.00    Types: Cigarettes    Last attempt to quit: 04/10/2014    Years since quitting: 9.8   Smokeless tobacco: Never   Tobacco comments:    2 cigarettes a week  Substance and Sexual Activity   Alcohol use: Yes    Comment: occassional   Drug use: No   Sexual activity: Yes    Birth control/protection: Post-menopausal  Other Topics Concern   Not on file  Social History Narrative   Pt is divorced and lives alone.    Regular exercise-no   Caffeine Use-yes   Social Drivers of Corporate investment banker Strain: Not on file  Food Insecurity: Not on file  Transportation Needs: Not on file  Physical Activity: Not on file  Stress: Not on file  Social Connections: Not on file  Intimate Partner Violence: Not on file    Outpatient Medications Prior to Visit  Medication Sig Dispense Refill   amLODipine (NORVASC) 5 MG tablet Take 5 mg by mouth daily.     aspirin EC 81 MG EC tablet Take 1 tablet (81 mg total) by mouth daily. 30 tablet 3   atorvastatin (LIPITOR) 20 MG tablet Take 20 mg by mouth daily.     hydroxychloroquine (PLAQUENIL) 200 MG tablet Take 200 mg by mouth 2 (two) times daily.     meloxicam (MOBIC) 15 MG tablet Take 15 mg by mouth daily.     metoprolol succinate (TOPROL-XL) 50 MG 24 hr tablet Take 50 mg by mouth daily. Take with or immediately following a meal.     nitroGLYCERIN (NITROSTAT) 0.4 MG SL tablet Place 0.4 mg under the tongue every 5 (five) minutes as needed for chest pain. Reported on 01/11/2016     pantoprazole (PROTONIX) 40 MG tablet Take 40 mg by mouth daily.  3   ramipril (ALTACE) 10 MG capsule Take 10 mg by mouth 2 (two) times daily.     predniSONE (DELTASONE) 10 MG tablet PLEASE SEE ATTACHED FOR DETAILED DIRECTIONS  (Patient not taking: Reported on 02/06/2024)     No facility-administered medications prior to visit.    Allergies  Allergen Reactions   Codeine Other (See Comments)   Lipitor [Atorvastatin] Other (See Comments)    Pt states med makes her feel "loopy"    Review of Systems  Constitutional:  Negative for chills, fever and malaise/fatigue.  HENT:  Negative for ear pain, hearing loss, sinus pain and tinnitus.   Eyes:  Negative for blurred vision and double vision.  Respiratory:  Negative for cough and shortness of breath.   Cardiovascular:  Negative for chest pain, palpitations and leg swelling.  Gastrointestinal:  Negative for abdominal pain, constipation, diarrhea, nausea and vomiting.  Genitourinary:  Negative for dysuria, frequency and urgency.  Musculoskeletal:  Positive for neck pain. Negative for falls and joint pain.  Neurological:  Negative for dizziness, tingling, focal weakness and headaches.       Objective:    Physical Exam Constitutional:      General: She is not in acute distress.    Appearance: She is not ill-appearing.  HENT:     Nose: Nose normal.     Mouth/Throat:     Mouth: Mucous membranes are moist.     Pharynx: Oropharynx is clear.  Eyes:     Extraocular Movements: Extraocular movements intact.     Conjunctiva/sclera: Conjunctivae normal.  Neck:     Vascular: No carotid bruit.  Cardiovascular:     Rate and Rhythm: Normal rate and regular rhythm.  Pulmonary:     Effort: Pulmonary effort is normal.     Breath sounds: Normal breath sounds.  Musculoskeletal:     Cervical back: Normal range of motion and neck supple. No tenderness.     Right lower leg: No edema.     Left lower leg: No edema.  Lymphadenopathy:     Cervical: No cervical adenopathy.  Skin:    General: Skin is warm and dry.     Findings: No rash.  Neurological:     General: No focal deficit present.     Mental Status: She is alert and oriented to person, place, and time.     Cranial  Nerves: No cranial nerve deficit.  Motor: No weakness.     Coordination: Coordination normal.     Gait: Gait normal.  Psychiatric:        Mood and Affect: Mood normal.        Behavior: Behavior normal.        Thought Content: Thought content normal.      BP 132/84 (BP Location: Left Arm, Patient Position: Sitting)   Pulse 70   Temp 97.8 F (36.6 C) (Temporal)   Ht 5\' 6"  (1.676 m)   Wt 194 lb (88 kg)   SpO2 99%   BMI 31.31 kg/m  Wt Readings from Last 3 Encounters:  02/06/24 194 lb (88 kg)  10/31/23 190 lb (86.2 kg)  08/07/23 186 lb (84.4 kg)       Assessment & Plan:   Problem List Items Addressed This Visit     Aortic atherosclerosis (HCC)   Relevant Orders   VAS US CAROTID   B12 deficiency   Relevant Orders   Vitamin B12 (Completed)   Ambulatory referral to Hematology / Oncology   Coronary artery disease   Relevant Orders   VAS US CAROTID   Cutaneous lupus erythematosus   Essential hypertension   Relevant Orders   CBC with Differential/Platelet (Completed)   Comprehensive metabolic panel (Completed)   Hiatal hernia with GERD   Relevant Orders   Ambulatory referral to Gastroenterology   History of colonic polyps   Relevant Orders   Ambulatory referral to Gastroenterology   Iron deficiency anemia - Primary   Relevant Orders   CBC with Differential/Platelet (Completed)   Ferritin (Completed)   Folate (Completed)   Vitamin B12 (Completed)   Comprehensive metabolic panel (Completed)   Ambulatory referral to Hematology / Oncology   Lupus erythematosus   Other Visit Diagnoses       Pulmonary emphysema, unspecified emphysema type (HCC)         Neck pain on left side       Relevant Orders   VAS US CAROTID     Thrombocytosis       Relevant Orders   CBC with Differential/Platelet (Completed)   Ambulatory referral to Hematology / Oncology      Vascular carotid US ordered due to pain of left lateral neck with certain head movements and hx of CAD. Most  likely MSK etiology.  IDA- taking oral iron and getting B 12 injections.  She did not follow up with Dr. Mosetta Putt and today reports needing a new referral which was made.  Referral to GI due to worsening GERD with hx of hiatal hernia. Continue Protonix and lifestyle modifications to control symptoms.    I have discontinued Candy Sledge. [place]oldt's predniSONE. I am also having her maintain her ramipril, nitroGLYCERIN, aspirin EC, metoprolol succinate, pantoprazole, amLODipine, meloxicam, hydroxychloroquine, and atorvastatin. We administered cyanocobalamin.  Meds ordered this encounter  Medications   cyanocobalamin (VITAMIN B12) injection 1,000 mcg

## 2024-02-06 NOTE — Patient Instructions (Signed)
 Please go downstairs for labs before you leave.  You will hear from [facility] gastroenterology to schedule an appointment.  You will also hear from Dr. Latanya Maudlin office to schedule an appointment.  I have ordered a carotid ultrasound study and they will call you to schedule this.

## 2024-02-06 NOTE — Progress Notes (Signed)
 Her anemia is not any better. Please ask her to take the oral iron supplement daily. She should follow up with Dr. Latanya Maudlin office soon hopefully. She can stop taking the oral vitamin B12 supplement since her B12 is quite high. Ok to skip 1 month of the B12 injection as well.

## 2024-02-12 DIAGNOSIS — I10 Essential (primary) hypertension: Secondary | ICD-10-CM | POA: Diagnosis not present

## 2024-02-12 DIAGNOSIS — E538 Deficiency of other specified B group vitamins: Secondary | ICD-10-CM | POA: Diagnosis not present

## 2024-02-12 DIAGNOSIS — I2510 Atherosclerotic heart disease of native coronary artery without angina pectoris: Secondary | ICD-10-CM | POA: Diagnosis not present

## 2024-02-12 DIAGNOSIS — E782 Mixed hyperlipidemia: Secondary | ICD-10-CM | POA: Diagnosis not present

## 2024-02-24 ENCOUNTER — Emergency Department (HOSPITAL_COMMUNITY)
Admission: EM | Admit: 2024-02-24 | Discharge: 2024-02-25 | Disposition: A | Discharge status: Home / Self Care | Attending: Emergency Medicine | Admitting: Emergency Medicine

## 2024-02-24 ENCOUNTER — Emergency Department (HOSPITAL_COMMUNITY)

## 2024-02-24 ENCOUNTER — Other Ambulatory Visit: Payer: Self-pay

## 2024-02-24 ENCOUNTER — Encounter (HOSPITAL_COMMUNITY): Payer: Self-pay | Admitting: *Deleted

## 2024-02-24 DIAGNOSIS — D259 Leiomyoma of uterus, unspecified: Secondary | ICD-10-CM | POA: Diagnosis not present

## 2024-02-24 DIAGNOSIS — Z7982 Long term (current) use of aspirin: Secondary | ICD-10-CM | POA: Diagnosis not present

## 2024-02-24 DIAGNOSIS — K5732 Diverticulitis of large intestine without perforation or abscess without bleeding: Secondary | ICD-10-CM | POA: Diagnosis not present

## 2024-02-24 DIAGNOSIS — R1032 Left lower quadrant pain: Secondary | ICD-10-CM | POA: Diagnosis not present

## 2024-02-24 DIAGNOSIS — R109 Unspecified abdominal pain: Secondary | ICD-10-CM | POA: Diagnosis not present

## 2024-02-24 DIAGNOSIS — K5792 Diverticulitis of intestine, part unspecified, without perforation or abscess without bleeding: Secondary | ICD-10-CM

## 2024-02-24 DIAGNOSIS — R1084 Generalized abdominal pain: Secondary | ICD-10-CM | POA: Diagnosis not present

## 2024-02-24 DIAGNOSIS — K449 Diaphragmatic hernia without obstruction or gangrene: Secondary | ICD-10-CM | POA: Diagnosis not present

## 2024-02-24 DIAGNOSIS — I10 Essential (primary) hypertension: Secondary | ICD-10-CM | POA: Diagnosis not present

## 2024-02-24 LAB — CBC
HCT: 32.1 % — ABNORMAL LOW (ref 36.0–46.0)
Hemoglobin: 9.2 g/dL — ABNORMAL LOW (ref 12.0–15.0)
MCH: 22.7 pg — ABNORMAL LOW (ref 26.0–34.0)
MCHC: 28.7 g/dL — ABNORMAL LOW (ref 30.0–36.0)
MCV: 79.3 fL — ABNORMAL LOW (ref 80.0–100.0)
Platelets: 448 10*3/uL — ABNORMAL HIGH (ref 150–400)
RBC: 4.05 MIL/uL (ref 3.87–5.11)
RDW: 17.1 % — ABNORMAL HIGH (ref 11.5–15.5)
WBC: 6.9 10*3/uL (ref 4.0–10.5)
nRBC: 0 % (ref 0.0–0.2)

## 2024-02-24 MED ORDER — MORPHINE SULFATE (PF) 4 MG/ML IV SOLN
4.0000 mg | Freq: Once | INTRAVENOUS | Status: AC
  Administered 2024-02-24: 4 mg via INTRAVENOUS
  Filled 2024-02-24: qty 1

## 2024-02-24 MED ORDER — ONDANSETRON HCL 4 MG/2ML IJ SOLN
4.0000 mg | Freq: Once | INTRAMUSCULAR | Status: AC
  Administered 2024-02-24: 4 mg via INTRAVENOUS
  Filled 2024-02-24: qty 2

## 2024-02-24 NOTE — ED Provider Notes (Signed)
 [place] EMERGENCY DEPARTMENT AT [facility] Provider Note   CSN: [id number] Arrival date & time: 02/24/24  2247     History {Add pertinent medical, surgical, social history, OB history to HPI:1} Chief Complaint  Patient presents with   Hip Pain   Emesis    [name] is a 78 y.o. female.  Patient presents to the emergency department for evaluation of flank pain.  Patient reports that there is severe pain in the left lateral back area that worsens with movement.  She cannot bend or twist.  When she tries to stand up and walk the pain worsens but is somewhat attenuated if she puts her fist into the small of her back and applies pressure.       Home Medications Prior to Admission medications   Medication Sig Start Date End Date Taking? Authorizing Provider  amLODipine (NORVASC) 5 MG tablet Take 5 mg by mouth daily. 05/01/21   [provider]  aspirin EC 81 MG EC tablet Take 1 tablet (81 mg total) by mouth daily. 03/14/13   Rinaldo Cloud, MD  atorvastatin (LIPITOR) 20 MG tablet Take 20 mg by mouth daily.    [provider]  hydroxychloroquine (PLAQUENIL) 200 MG tablet Take 200 mg by mouth 2 (two) times daily.    [provider]  meloxicam (MOBIC) 15 MG tablet Take 15 mg by mouth daily. 06/04/23   [provider]  metoprolol succinate (TOPROL-XL) 50 MG 24 hr tablet Take 50 mg by mouth daily. Take with or immediately following a meal.    [provider]  nitroGLYCERIN (NITROSTAT) 0.4 MG SL tablet Place 0.4 mg under the tongue every 5 (five) minutes as needed for chest pain. Reported on 01/11/2016    [provider]  pantoprazole (PROTONIX) 40 MG tablet Take 40 mg by mouth daily. 06/01/15   [provider]  ramipril (ALTACE) 10 MG capsule Take 10 mg by mouth 2 (two) times daily.    [provider]      Allergies    Codeine and Lipitor [atorvastatin]    Review of Systems   Review of  Systems  Physical Exam Updated Vital Signs BP (!) 177/72 (BP Location: Right Arm)   Pulse 75   Temp 98.2 F (36.8 C) (Oral)   Resp 16   Ht 5\' 6"  (1.676 m)   Wt 88 kg   SpO2 98%   BMI 31.31 kg/m  Physical Exam  ED Results / Procedures / Treatments   Labs (all labs ordered are listed, but only abnormal results are displayed) Labs Reviewed  LIPASE, BLOOD  COMPREHENSIVE METABOLIC PANEL  CBC  URINALYSIS, ROUTINE W REFLEX MICROSCOPIC    EKG None  Radiology No results found.  Procedures Procedures  {Document cardiac monitor, telemetry assessment procedure when appropriate:1}  Medications Ordered in ED Medications  ondansetron (ZOFRAN) injection 4 mg (has no administration in time range)  morphine (PF) 4 MG/ML injection 4 mg (has no administration in time range)    ED Course/ Medical Decision Making/ A&P   {   Click here for ABCD2, HEART and other calculatorsREFRESH Note before signing :1}                              Medical Decision Making Amount and/or Complexity of Data Reviewed Labs: ordered. Radiology: ordered.  Risk Prescription drug management.   ***  {Document critical care time when appropriate:1} {Document review of  labs and clinical decision tools ie heart score, Chads2Vasc2 etc:1}  {Document your independent review of radiology images, and any outside records:1} {Document your discussion with family members, caretakers, and with consultants:1} {Document social determinants of health affecting pt's care:1} {Document your decision making why or why not admission, treatments were needed:1} Final Clinical Impression(s) / ED Diagnoses Final diagnoses:  None    Rx / DC Orders ED Discharge Orders     None

## 2024-02-24 NOTE — ED Triage Notes (Signed)
 Pt initially called EMS for abdominal pain.  States lower back pain that radiates to L hip.  Vomited yesterday.  States only able to walk when she puts pressure on her L hip.  Also c/o increased urination.  Pt very tearful in triage d/t pain.

## 2024-02-25 ENCOUNTER — Emergency Department (HOSPITAL_COMMUNITY)

## 2024-02-25 ENCOUNTER — Telehealth: Payer: Self-pay

## 2024-02-25 ENCOUNTER — Other Ambulatory Visit (HOSPITAL_COMMUNITY)

## 2024-02-25 DIAGNOSIS — K449 Diaphragmatic hernia without obstruction or gangrene: Secondary | ICD-10-CM | POA: Diagnosis not present

## 2024-02-25 DIAGNOSIS — D259 Leiomyoma of uterus, unspecified: Secondary | ICD-10-CM | POA: Diagnosis not present

## 2024-02-25 DIAGNOSIS — R109 Unspecified abdominal pain: Secondary | ICD-10-CM | POA: Diagnosis not present

## 2024-02-25 DIAGNOSIS — K5732 Diverticulitis of large intestine without perforation or abscess without bleeding: Secondary | ICD-10-CM | POA: Diagnosis not present

## 2024-02-25 LAB — COMPREHENSIVE METABOLIC PANEL
ALT: 13 U/L (ref 0–44)
AST: 20 U/L (ref 15–41)
Albumin: 3.9 g/dL (ref 3.5–5.0)
Alkaline Phosphatase: 93 U/L (ref 38–126)
Anion gap: 11 (ref 5–15)
BUN: 13 mg/dL (ref 8–23)
CO2: 20 mmol/L — ABNORMAL LOW (ref 22–32)
Calcium: 9.4 mg/dL (ref 8.9–10.3)
Chloride: 107 mmol/L (ref 98–111)
Creatinine, Ser: 0.8 mg/dL (ref 0.44–1.00)
GFR, Estimated: >60 mL/min (ref >60)
Glucose, Bld: 102 mg/dL — ABNORMAL HIGH (ref 70–99)
Potassium: 3.8 mmol/L (ref 3.5–5.1)
Sodium: 138 mmol/L (ref 135–145)
Total Bilirubin: 0.4 mg/dL (ref 0.0–1.2)
Total Protein: 6.8 g/dL (ref 6.5–8.1)

## 2024-02-25 LAB — URINALYSIS, ROUTINE W REFLEX MICROSCOPIC
Bacteria, UA: NONE SEEN
Bilirubin Urine: NEGATIVE
Glucose, UA: NEGATIVE mg/dL
Ketones, ur: NEGATIVE mg/dL
Nitrite: NEGATIVE
Protein, ur: NEGATIVE mg/dL
Specific Gravity, Urine: 1.006 (ref 1.005–1.030)
pH: 5 (ref 5.0–8.0)

## 2024-02-25 LAB — LIPASE, BLOOD: Lipase: 43 U/L (ref 11–51)

## 2024-02-25 MED ORDER — SODIUM CHLORIDE 0.9 % IV SOLN
3.0000 g | Freq: Once | INTRAVENOUS | Status: AC
  Administered 2024-02-25: 3 g via INTRAVENOUS
  Filled 2024-02-25: qty 8

## 2024-02-25 MED ORDER — ONDANSETRON 4 MG PO TBDP: ORAL_TABLET | ORAL | 0 refills | Status: DC

## 2024-02-25 MED ORDER — HYDROCODONE-ACETAMINOPHEN 5-325 MG PO TABS: 1.0000 | ORAL_TABLET | ORAL | 0 refills | Status: DC | PRN

## 2024-02-25 MED ORDER — AMOXICILLIN-POT CLAVULANATE 875-125 MG PO TABS: 1.0000 | ORAL_TABLET | Freq: Two times a day (BID) | ORAL | 0 refills | Status: DC

## 2024-02-25 NOTE — Transitions of Care (Post Inpatient/ED Visit) (Signed)
   02/25/2024  Name: [name] MRN: [id number] DOB: [birth year]  Today's TOC FU Call Status: Today's TOC FU Call Status:: Unsuccessful Call (1st Attempt) Unsuccessful Call (1st Attempt) Date: 02/25/24  Attempted to reach the patient regarding the most recent Inpatient/ED visit.  Follow Up Plan: Additional outreach attempts will be made to reach the patient to complete the Transitions of Care (Post Inpatient/ED visit) call.   Signature: Elyse Jarvis, CMA

## 2024-02-29 ENCOUNTER — Ambulatory Visit (HOSPITAL_COMMUNITY)
Admission: RE | Admit: 2024-02-29 | Discharge: 2024-02-29 | Disposition: A | Discharge status: Home / Self Care | Payer: Medicare PPO | Source: Ambulatory Visit | Attending: Family Medicine | Admitting: Family Medicine

## 2024-02-29 DIAGNOSIS — I2510 Atherosclerotic heart disease of native coronary artery without angina pectoris: Secondary | ICD-10-CM | POA: Insufficient documentation

## 2024-02-29 DIAGNOSIS — M542 Cervicalgia: Secondary | ICD-10-CM | POA: Insufficient documentation

## 2024-02-29 DIAGNOSIS — I700 Atherosclerosis of aorta: Secondary | ICD-10-CM | POA: Insufficient documentation

## 2024-03-02 NOTE — Progress Notes (Signed)
 Good news regarding the results of this test. Carotid arteries are without any significant plaque build up or stenosis. She does not yet have an appt with GI. Please check on this. I referred her at her last visit. Thanks.

## 2024-03-04 ENCOUNTER — Inpatient Hospital Stay

## 2024-03-04 ENCOUNTER — Inpatient Hospital Stay: Attending: Hematology | Admitting: Hematology

## 2024-03-04 ENCOUNTER — Other Ambulatory Visit: Payer: Self-pay

## 2024-03-04 VITALS — BP 142/68 | HR 71 | Temp 98.0°F | Resp 17 | Ht 66.0 in | Wt 198.1 lb

## 2024-03-04 DIAGNOSIS — E785 Hyperlipidemia, unspecified: Secondary | ICD-10-CM | POA: Diagnosis not present

## 2024-03-04 DIAGNOSIS — E538 Deficiency of other specified B group vitamins: Secondary | ICD-10-CM

## 2024-03-04 DIAGNOSIS — Z79899 Other long term (current) drug therapy: Secondary | ICD-10-CM | POA: Insufficient documentation

## 2024-03-04 DIAGNOSIS — Z7982 Long term (current) use of aspirin: Secondary | ICD-10-CM | POA: Insufficient documentation

## 2024-03-04 DIAGNOSIS — I10 Essential (primary) hypertension: Secondary | ICD-10-CM | POA: Insufficient documentation

## 2024-03-04 DIAGNOSIS — Z1231 Encounter for screening mammogram for malignant neoplasm of breast: Secondary | ICD-10-CM

## 2024-03-04 DIAGNOSIS — Z853 Personal history of malignant neoplasm of breast: Secondary | ICD-10-CM | POA: Insufficient documentation

## 2024-03-04 DIAGNOSIS — K5792 Diverticulitis of intestine, part unspecified, without perforation or abscess without bleeding: Secondary | ICD-10-CM | POA: Insufficient documentation

## 2024-03-04 DIAGNOSIS — F17210 Nicotine dependence, cigarettes, uncomplicated: Secondary | ICD-10-CM | POA: Diagnosis not present

## 2024-03-04 DIAGNOSIS — D509 Iron deficiency anemia, unspecified: Secondary | ICD-10-CM

## 2024-03-04 NOTE — Progress Notes (Signed)
 [facility]   [phone] Fax:([phone]   Clinic New Consult Note   Patient Care Team: Avanell Shackleton, NP-C as PCP - General (Family Medicine) Rinaldo Cloud, MD as Consulting Physician (Cardiology) 03/04/2024  CHIEF COMPLAINTS/PURPOSE OF CONSULTATION:  Anemia  REFERRING PHYSICIAN: Avanell Shackleton, NP-C   Discussed the use of AI scribe software for clinical note transcription with the patient, who gave verbal consent to proceed.  History of Present Illness   [name], a 78 year old female with a history of breast cancer, presents with persistent anemia despite taking over-the-counter iron pills for the past three to four months.  She was referred by her PCP, she presents to clinic by herself.  She was previously under my care for her history of breast cancer, last was seen in 2021.   She reports that her primary care physician, Dr. Christeen Hall, has been monitoring her B12 levels, which were initially very low but have since returned to normal. However, her iron levels remain very low. [name] also mentions a recent hospital visit due to a pain in her left side, which was diagnosed as diverticulitis and treated with antibiotics. She denies any bleeding but confirms the presence of an infection in her intestines. [name] also has a history of lupus and is currently on medication for the same. She reports no appetite issues but acknowledges the need to improve her diet by incorporating more fruits and vegetables.         MEDICAL HISTORY:  Past Medical History:  Diagnosis Date   Allergic rhinitis    Anginal pain (HCC)    Arthritis    Asthma    Breast cancer (HCC) 2015   Right Breast Cancer   Coronary artery disease    Emphysema of lung (HCC)    GERD (gastroesophageal reflux disease)    Hypertension    Lupus (systemic lupus erythematosus) (HCC)    Myocardial infarction (HCC)     SURGICAL HISTORY: Past Surgical History:  Procedure Laterality Date    CORONARY STENT PLACEMENT  1995   MASTECTOMY Right 2015    SOCIAL HISTORY: Social History   Socioeconomic History   Marital status: Divorced    Spouse name: Not on file   Number of children: Not on file   Years of education: 12   Highest education level: Not on file  Occupational History   Occupation: retired from the school systems    Employer: RETIRED    Comment: Geologist, engineering  Tobacco Use   Smoking status: Light Smoker    Current packs/day: 0.00    Types: Cigarettes    Last attempt to quit: 04/10/2014    Years since quitting: 9.9   Smokeless tobacco: Never   Tobacco comments:    2 cigarettes a week  Substance and Sexual Activity   Alcohol use: Yes    Comment: occassional   Drug use: No   Sexual activity: Yes    Birth control/protection: Post-menopausal  Other Topics Concern   Not on file  Social History Narrative   Pt is divorced and lives alone.    Regular exercise-no   Caffeine Use-yes   Social Drivers of Corporate investment banker Strain: Not on file  Food Insecurity: Not on file  Transportation Needs: Not on file  Physical Activity: Not on file  Stress: Not on file  Social Connections: Not on file  Intimate Partner Violence: Not on file    FAMILY HISTORY: Family History  Problem Relation Age of Onset  Allergies Mother    Heart disease Mother    Diabetes Mother    Hypertension Mother    Cancer Father        Lung Cancer   Cancer Sister 31       lung cancer    Cancer Maternal Aunt 11       colon cancer    Cancer Maternal Aunt 96       uterine cancer     ALLERGIES:  is allergic to codeine and lipitor [atorvastatin].  MEDICATIONS:  Current Outpatient Medications  Medication Sig Dispense Refill   amLODipine (NORVASC) 5 MG tablet Take 5 mg by mouth daily.     amoxicillin-clavulanate (AUGMENTIN) 875-125 MG tablet Take 1 tablet by mouth every 12 (twelve) hours. 20 tablet 0   aspirin EC 81 MG EC tablet Take 1 tablet (81 mg total) by mouth  daily. 30 tablet 3   atorvastatin (LIPITOR) 20 MG tablet Take 20 mg by mouth daily.     HYDROcodone-acetaminophen (NORCO/VICODIN) 5-325 MG tablet Take 1 tablet by mouth every 4 (four) hours as needed for moderate pain (pain score 4-6). 10 tablet 0   hydroxychloroquine (PLAQUENIL) 200 MG tablet Take 200 mg by mouth 2 (two) times daily.     meloxicam (MOBIC) 15 MG tablet Take 15 mg by mouth daily.     metoprolol succinate (TOPROL-XL) 50 MG 24 hr tablet Take 50 mg by mouth daily. Take with or immediately following a meal.     nitroGLYCERIN (NITROSTAT) 0.4 MG SL tablet Place 0.4 mg under the tongue every 5 (five) minutes as needed for chest pain. Reported on 01/11/2016     ondansetron (ZOFRAN-ODT) 4 MG disintegrating tablet 4mg  ODT q4 hours prn nausea/vomit 10 tablet 0   pantoprazole (PROTONIX) 40 MG tablet Take 40 mg by mouth daily.  3   ramipril (ALTACE) 10 MG capsule Take 10 mg by mouth 2 (two) times daily.     No current facility-administered medications for this visit.    REVIEW OF SYSTEMS:   Constitutional: Denies fevers, chills or abnormal night sweats Eyes: Denies blurriness of vision, double vision or watery eyes Ears, nose, mouth, throat, and face: Denies mucositis or sore throat Respiratory: Denies cough, dyspnea or wheezes Cardiovascular: Denies palpitation, chest discomfort or lower extremity swelling Gastrointestinal:  Denies nausea, heartburn or change in bowel habits Skin: Denies abnormal skin rashes Lymphatics: Denies new lymphadenopathy or easy bruising Neurological:Denies numbness, tingling or new weaknesses Behavioral/Psych: Mood is stable, no new changes  All other systems were reviewed with the patient and are negative.  PHYSICAL EXAMINATION: ECOG PERFORMANCE STATUS: 1 - Symptomatic but completely ambulatory  Vitals:   03/04/24 1503  BP: (!) 142/68  Pulse: 71  Resp: 17  Temp: 98 F (36.7 C)  SpO2: 100%   Filed Weights   03/04/24 1503  Weight: 198 lb 1.6 oz  (89.9 kg)    GENERAL:alert, no distress and comfortable SKIN: skin color, texture, turgor are normal, no rashes or significant lesions EYES: normal, conjunctiva are pink and non-injected, sclera clear OROPHARYNX:no exudate, no erythema and lips, buccal mucosa, and tongue normal  NECK: supple, thyroid normal size, non-tender, without nodularity LYMPH:  no palpable lymphadenopathy in the cervical, axillary or inguinal LUNGS: clear to auscultation and percussion with normal breathing effort HEART: regular rate & rhythm and no murmurs and no lower extremity edema ABDOMEN:abdomen soft, non-tender and normal bowel sounds Musculoskeletal:no cyanosis of digits and no clubbing  PSYCH: alert & oriented x 3 with fluent  speech NEURO: no focal motor/sensory deficits  Physical Exam          LABORATORY DATA:  I have reviewed the data as listed    Latest Ref Rng & Units 02/24/2024   10:58 PM 02/06/2024    3:23 PM 10/31/2023    1:59 PM  CBC  WBC 4.0 - 10.5 K/uL 6.9  6.6  11.7   Hemoglobin 12.0 - 15.0 g/dL 9.2  9.4  9.3   Hematocrit 36.0 - 46.0 % 32.1  32.0  29.8   Platelets 150 - 400 K/uL 448  414.0  514.0     @cmpl @  RADIOGRAPHIC STUDIES: I have personally reviewed the radiological images as listed and agreed with the findings in the report. VAS US CAROTID Result Date: 02/29/2024 Carotid Arterial Duplex Study Patient Name:  PERSAIS ETHRIDGE  Date of Exam:   02/29/2024 Medical Rec #: [id number]         Accession #:    [id number] Date of Birth: 07/19/1946          Patient Gender: F Patient Age:   33 years Exam Location:  [facility] Procedure:      VAS US CAROTID Referring Phys: Larene Beach HENSON --------------------------------------------------------------------------------  Indications:       Arteriosclerotic cardiovascular disease. Patient reports for                    the last 3-4 months episodes of feeling lightheaded with                    change in head position,; occuring once or twice a month.  Risk Factors:      Hypertension, current smoker, coronary artery disease. Comparison Study:  NA Performing Technologist: Jeryl Columbia RDCS  Examination Guidelines: A complete evaluation includes B-mode imaging, spectral Doppler, color Doppler, and power Doppler as needed of all accessible portions of each vessel. Bilateral testing is considered an integral part of a complete examination. Limited examinations for reoccurring indications may be performed as noted.  Right Carotid Findings: +----------+--------+--------+--------+------------------+------------------+           PSV cm/sEDV cm/sStenosisPlaque DescriptionComments           +----------+--------+--------+--------+------------------+------------------+ CCA Prox  198     17                                tortuous           +----------+--------+--------+--------+------------------+------------------+ CCA Mid   78      9                                                    +----------+--------+--------+--------+------------------+------------------+ CCA Distal65      14                                                   +----------+--------+--------+--------+------------------+------------------+ ICA Prox  52      14      1-39%   heterogenous      high bifurfication +----------+--------+--------+--------+------------------+------------------+ ICA Mid   61      17                                                   +----------+--------+--------+--------+------------------+------------------+  ICA Distal44      15                                                   +----------+--------+--------+--------+------------------+------------------+ ECA       44      7                                                    +----------+--------+--------+--------+------------------+------------------+ +----------+--------+-------+----------------+-------------------+           PSV cm/sEDV cmsDescribe        Arm Pressure  (mmHG) +----------+--------+-------+----------------+-------------------+ [id number]             Multiphasic, WNL                    +----------+--------+-------+----------------+-------------------+ +---------+--------+--+--------+--+---------+ VertebralPSV cm/s67EDV cm/s10Antegrade +---------+--------+--+--------+--+---------+  Left Carotid Findings: +----------+--------+--------+--------+------------------+------------------+           PSV cm/sEDV cm/sStenosisPlaque DescriptionComments           +----------+--------+--------+--------+------------------+------------------+ CCA Prox  66      9                                 tortuous           +----------+--------+--------+--------+------------------+------------------+ CCA Mid   88      20                                                   +----------+--------+--------+--------+------------------+------------------+ CCA Distal64      13                                                   +----------+--------+--------+--------+------------------+------------------+ ICA Prox  51      7                                 high bifurfication +----------+--------+--------+--------+------------------+------------------+ ICA Mid   71      10                                                   +----------+--------+--------+--------+------------------+------------------+ ICA Distal90      24                                                   +----------+--------+--------+--------+------------------+------------------+ ECA       35      6                                                    +----------+--------+--------+--------+------------------+------------------+ +----------+--------+--------+----------------+-------------------+  PSV cm/sEDV cm/sDescribe        Arm Pressure (mmHG) +----------+--------+--------+----------------+-------------------+ Subclavian100             Multiphasic, WNL                     +----------+--------+--------+----------------+-------------------+ +---------+--------+--+--------+--+---------+ VertebralPSV cm/s47EDV cm/s16Antegrade +---------+--------+--+--------+--+---------+   Summary: Right Carotid: Velocities in the right ICA are consistent with a 1-39% stenosis. Left Carotid: The extracranial vessels were near-normal with only minimal wall               thickening or plaque. Vertebrals:  Bilateral vertebral arteries demonstrate antegrade flow. Subclavians: Normal flow hemodynamics were seen in bilateral subclavian              arteries. *See table(s) above for measurements and observations.  Electronically signed by Charlton Haws MD on 02/29/2024 at 3:29:32 PM.    Final    CT RENAL STONE STUDY Result Date: 02/25/2024 CLINICAL DATA:  Abdominal/flank pain, stone suspected EXAM: CT ABDOMEN AND PELVIS WITHOUT CONTRAST TECHNIQUE: Multidetector CT imaging of the abdomen and pelvis was performed following the standard protocol without IV contrast. RADIATION DOSE REDUCTION: This exam was performed according to the departmental dose-optimization program which includes automated exposure control, adjustment of the mA and/or kV according to patient size and/or use of iterative reconstruction technique. COMPARISON:  CT abdomen pelvis 07/13/2023 FINDINGS: Lower chest: No acute abnormality. Hepatobiliary: No acute abnormality. Pancreas: Unremarkable. Spleen: Unremarkable. Adrenals/Urinary Tract: Normal adrenal glands. No urinary calculi or hydronephrosis. Unremarkable bladder. Stomach/Bowel: Moderate hiatal hernia. No bowel obstruction. Extensive sigmoid diverticulosis. There is trace stranding about the proximal sigmoid colon (circa series 3/image 56). Normal appendix. Vascular/Lymphatic: Aortic atherosclerosis. No enlarged abdominal or pelvic lymph nodes. Reproductive: Calcified uterine fibroids.  No adnexal mass. Other: No organized fluid collection or abscess. No free  intraperitoneal air. Musculoskeletal: No acute fracture. IMPRESSION: 1. Mild uncomplicated sigmoid diverticulitis. Consider follow-up colonoscopy after resolution of acute symptoms to exclude underlying mass. 2. Moderate hiatal hernia. 3. Aortic Atherosclerosis (ICD10-I70.0). Electronically Signed   By: Minerva Fester M.D.   On: 02/25/2024 00:50    ASSESSMENT & PLAN:  78 year old female with recurrent iron deficient anemia    Anemia secondary to iron deficiency and B12 deficiency She had a history of iron deficiency in the past, resolved with oral iron.  Current hemoglobin levels are 9.3 to 9.4 g/dL, indicating mild anemia.  Recent labs showed low ferritin, consistent with iron deficiency.  She also has a low B12 level in 2024, on monthly injection now and repeat a B12 level has been normal or above normal.   -Gastrointestinal bleeding is suspected as a potential cause, given her age and lack of menstrual periods. Oral iron has not improved her anemia, so intravenous iron therapy is recommended. - Refer for colonoscopy to evaluate for potential gastrointestinal bleeding. - Administer intravenous iron therapy at the [facility]. Consult insurance to determine the appropriate iron formulation. - Schedule monthly blood work to monitor anemia resolution.  Diverticulitis Recently diagnosed with diverticulitis, presenting with left-sided abdominal pain and intestinal infection. Currently on antibiotics (Augmentin) for treatment. - Continue antibiotics as prescribed for diverticulitis.  Breast Cancer (Post-Mastectomy) Underwent mastectomy for breast cancer and 5 years adjuvant exemestane.  Compliant with annual mammograms, with the last one performed in October 2024. - Order mammogram for October 2025. - Provide prescription for specialized bras post-mastectomy.  Hypertension On multiple antihypertensive medications, including amlodipine, metoprolol, and ramipril.  Hyperlipidemia On  atorvastatin for hyperlipidemia  management.  Lupus On Plaquenil for lupus management.  Plan -Lab reviewed, due to her poor response to oral iron, I recommend IV iron for a total of 1 g.  Iron products per her insurance approval.   -Will schedule her IV iron at the [facility] office f -Will contact her GI Dr. Elnoria Howard for endoscopy workup if needed. -Lab monthly, follow-up in 3 months.    Orders Placed This Encounter  Procedures   MM 3D SCREENING MAMMOGRAM UNILATERAL LEFT BREAST    [facility] PF; e: 09/24/2023    Standing Status:   Future    Expected Date:   09/24/2024    Expiration Date:   03/04/2025    Reason for Exam (SYMPTOM  OR DIAGNOSIS REQUIRED):   screening    [facility]erred [facility]   CBC with Differential/Platelet    Standing Status:   Standing    Number of Occurrences:   50    Expiration Date:   03/04/2025   Ferritin    Standing Status:   Standing    Number of Occurrences:   20    Expiration Date:   03/04/2025   Vitamin B12    Standing Status:   Standing    Number of Occurrences:   5    Expiration Date:   03/04/2025    All questions were answered. The patient knows to call the clinic with any problems, questions or concerns. I spent 25 minutes counseling the patient face to face. The total time spent in the appointment was 45 minutes and more than 50% was on counseling.     Malachy Mood, MD 03/04/2024 5:18 PM

## 2024-03-05 ENCOUNTER — Other Ambulatory Visit: Payer: Self-pay

## 2024-03-05 DIAGNOSIS — D509 Iron deficiency anemia, unspecified: Secondary | ICD-10-CM

## 2024-03-05 NOTE — Progress Notes (Signed)
 Faxed referral for Colonoscopy to Dr. Jeani Hawking at [facility] Faxed last office note, last labs, demographic sheet, copy of referral, and copy of insurance card. Received confirmation of fax receipt.

## 2024-03-06 ENCOUNTER — Other Ambulatory Visit: Payer: Self-pay | Admitting: Nurse Practitioner

## 2024-03-06 ENCOUNTER — Telehealth: Payer: Self-pay

## 2024-03-06 NOTE — Telephone Encounter (Signed)
 [name], patient will be scheduled as soon as possible.  Auth Submission: NO AUTH NEEDED Site of care: Site of care: [facility] Payer: [employer] medicare Medication & CPT/J Code(s) submitted: Venofer (Iron Sucrose) J1756 Route of submission (phone, fax, portal):  Phone # Fax # Auth type: Buy/Bill PB Units/visits requested: 200mg  x 5 doses Reference number:  Approval from: 03/06/24 to 09/06/24

## 2024-03-12 ENCOUNTER — Ambulatory Visit (INDEPENDENT_AMBULATORY_CARE_PROVIDER_SITE_OTHER): Payer: Medicare PPO

## 2024-03-12 ENCOUNTER — Inpatient Hospital Stay: Admitting: Family Medicine

## 2024-03-12 VITALS — Ht 66.0 in | Wt 198.0 lb

## 2024-03-12 DIAGNOSIS — Z0000 Encounter for general adult medical examination without abnormal findings: Secondary | ICD-10-CM | POA: Diagnosis not present

## 2024-03-12 NOTE — Patient Instructions (Signed)
 Ms. Boullion , Thank you for taking time to come for your Medicare Wellness Visit. I appreciate your ongoing commitment to your health goals. Please review the following plan we discussed and let me know if I can assist you in the future.   Referrals/Orders/Follow-Ups/Clinician Recommendations: It was nice to talk with you today.  You are due for a Tetanus vaccine and a Shingles vaccine.    This is a list of the screening recommended for you and due dates:  Health Maintenance  Topic Date Due   DTaP/Tdap/Td vaccine (1 - Tdap) Never done   Zoster (Shingles) Vaccine (1 of 2) Never done   Medicare Annual Wellness Visit  01/19/2015   COVID-19 Vaccine (4 - 2024-25 season) 08/12/2023   Flu Shot  07/11/2024   Pneumonia Vaccine  Completed   DEXA scan (bone density measurement)  Completed   Hepatitis C Screening  Completed   HPV Vaccine  Aged Out   Colon Cancer Screening  Discontinued    Advanced directives: (Copy Requested) Please bring a copy of your health care power of attorney and living will to the office to be added to your chart at your convenience. You can mail to [facility] [place] [address] [place], [place] [address] or email to [email or web address]  Next Medicare Annual Wellness Visit scheduled for next year: Yes

## 2024-03-12 NOTE — Progress Notes (Signed)
 Subjective:   [name] is a 78 y.o. who presents for a Medicare Wellness preventive visit.  Visit Complete: Virtual I connected with  Saralyn Pilar on 03/12/24 by a audio enabled telemedicine application and verified that I am speaking with the correct person using two identifiers.  Patient Location: Home  Provider Location: Home Office  I discussed the limitations of evaluation and management by telemedicine. The patient expressed understanding and agreed to proceed.  Vital Signs: Because this visit was a virtual/telehealth visit, some criteria may be missing or patient reported. Any vitals not documented were not able to be obtained and vitals that have been documented are patient reported.  VideoDeclined- This patient declined Librarian, academic. Therefore the visit was completed with audio only.  Persons Participating in Visit: Patient.  AWV Questionnaire: No: Patient Medicare AWV questionnaire was not completed prior to this visit.  Cardiac Risk Factors include: advanced age (>40men, >41 women)     Objective:    Today's Vitals   03/12/24 1416  Weight: 198 lb (89.8 kg)  Height: 5\' 6"  (1.676 m)   Body mass index is 31.96 kg/m.     03/12/2024    2:27 PM 02/24/2024   10:54 PM 05/17/2021    4:17 PM 05/25/2020   12:36 PM 02/27/2018   11:31 AM 08/30/2017    2:43 PM 03/01/2017    3:28 PM  Advanced Directives  Does Patient Have a Medical Advance Directive? No;Yes No Yes Yes Yes Yes Yes  Type of Loss adjuster, chartered of [place];Living will   Copy of Healthcare Power of Attorney in Chart?   --  No - copy requested Yes No - copy requested  Would patient like information on creating a medical advance directive? No - Patient declined          Current Medications (verified) Outpatient Encounter Medications as of 03/12/2024  Medication Sig   amLODipine (NORVASC) 5 MG tablet Take 5 mg by mouth daily.    aspirin EC 81 MG EC tablet Take 1 tablet (81 mg total) by mouth daily.   atorvastatin (LIPITOR) 20 MG tablet Take 20 mg by mouth daily.   hydroxychloroquine (PLAQUENIL) 200 MG tablet Take 200 mg by mouth 2 (two) times daily.   metoprolol succinate (TOPROL-XL) 50 MG 24 hr tablet Take 50 mg by mouth daily. Take with or immediately following a meal.   nitroGLYCERIN (NITROSTAT) 0.4 MG SL tablet Place 0.4 mg under the tongue every 5 (five) minutes as needed for chest pain. Reported on 01/11/2016   ondansetron (ZOFRAN-ODT) 4 MG disintegrating tablet 4mg  ODT q4 hours prn nausea/vomit   pantoprazole (PROTONIX) 40 MG tablet Take 40 mg by mouth daily.   ramipril (ALTACE) 10 MG capsule Take 10 mg by mouth 2 (two) times daily.   amoxicillin-clavulanate (AUGMENTIN) 875-125 MG tablet Take 1 tablet by mouth every 12 (twelve) hours. (Patient not taking: Reported on 03/12/2024)   HYDROcodone-acetaminophen (NORCO/VICODIN) 5-325 MG tablet Take 1 tablet by mouth every 4 (four) hours as needed for moderate pain (pain score 4-6). (Patient not taking: Reported on 03/12/2024)   meloxicam (MOBIC) 15 MG tablet Take 15 mg by mouth daily. (Patient not taking: Reported on 03/12/2024)   No facility-administered encounter medications on file as of 03/12/2024.    Allergies (verified) Codeine and Lipitor [atorvastatin]   History: Past Medical History:  Diagnosis Date   Allergic rhinitis    Anginal pain (HCC)  Arthritis    Asthma    Breast cancer (HCC) 2015   Right Breast Cancer   Coronary artery disease    Emphysema of lung (HCC)    GERD (gastroesophageal reflux disease)    Hypertension    Lupus (systemic lupus erythematosus) (HCC)    Myocardial infarction [facility])    Past Surgical History:  Procedure Laterality Date   CORONARY STENT PLACEMENT  1995   MASTECTOMY Right 2015   Family History  Problem Relation Age of Onset   Allergies Mother    Heart disease Mother    Diabetes Mother    Hypertension Mother     Cancer Father        Lung Cancer   Cancer Sister 43       lung cancer    Cancer Maternal Aunt 22       colon cancer    Cancer Maternal Aunt 22       uterine cancer    Social History   Socioeconomic History   Marital status: Divorced    Spouse name: Not on file   Number of children: Not on file   Years of education: 12   Highest education level: Not on file  Occupational History   Occupation: retired from the school systems    Employer: RETIRED    Comment: Geologist, engineering  Tobacco Use   Smoking status: Light Smoker    Current packs/day: 0.00    Types: Cigarettes    Last attempt to quit: 04/10/2014    Years since quitting: 9.9   Smokeless tobacco: Never   Tobacco comments:    2 cigarettes a week  Vaping Use   Vaping status: Never Used  Substance and Sexual Activity   Alcohol use: Yes    Comment: occassional   Drug use: No   Sexual activity: Yes    Birth control/protection: Post-menopausal  Other Topics Concern   Not on file  Social History Narrative   Pt is divorced and lives alone.    Regular exercise-no   Caffeine Use-yes   Social Drivers of Health   Financial Resource Strain: Low Risk  (03/12/2024)   Overall Financial Resource Strain (CARDIA)    Difficulty of Paying Living Expenses: Not very hard  Food Insecurity: No Food Insecurity (03/12/2024)   Hunger Vital Sign    Worried About Running Out of Food in the Last Year: Never true    Ran Out of Food in the Last Year: Never true  Transportation Needs: No Transportation Needs (03/12/2024)   PRAPARE - Administrator, Civil Service (Medical): No    Lack of Transportation (Non-Medical): No  Physical Activity: Inactive (03/12/2024)   Exercise Vital Sign    Days of Exercise per Week: 0 days    Minutes of Exercise per Session: 0 min  Stress: No Stress Concern Present (03/12/2024)   [employer] of Occupational Health - Occupational Stress Questionnaire    Feeling of Stress : Not at all  Social  Connections: Moderately Isolated (03/12/2024)   Social Connection and Isolation Panel [NHANES]    Frequency of Communication with Friends and Family: More than three times a week    Frequency of Social Gatherings with Friends and Family: Once a week    Attends Religious Services: More than 4 times per year    Active Member of [employer] or Organizations: No    Attends Banker Meetings: Never    Marital Status: Divorced    Tobacco Counseling Ready to quit:  Not Answered Counseling given: Not Answered Tobacco comments: 2 cigarettes a week    Clinical Intake:  Pre-visit preparation completed: Yes  Pain : No/denies pain     BMI - recorded: 31.96 Nutritional Status: BMI > 30  Obese Nutritional Risks: None Diabetes: No  Lab Results  Component Value Date   HGBA1C 5.6 01/20/2013     How often do you need to have someone help you when you read instructions, pamphlets, or other written materials from your doctor or pharmacy?: 1 - Never  Interpreter Needed?: No  Information entered by :: Crissie Aloi, RMA   Activities of Daily Living     03/12/2024    2:24 PM  In your present state of health, do you have any difficulty performing the following activities:  Hearing? 0  Vision? 0  Difficulty concentrating or making decisions? 0  Walking or climbing stairs? 0  Dressing or bathing? 0  Doing errands, shopping? 0  Preparing Food and eating ? N  Using the Toilet? N  In the past six months, have you accidently leaked urine? N  Do you have problems with loss of bowel control? N  Managing your Medications? N  Managing your Finances? N  Housekeeping or managing your Housekeeping? N    Patient Care Team: Avanell Shackleton, NP-C as PCP - General (Family Medicine) Rinaldo Cloud, MD as Consulting Physician (Cardiology) Jeani Hawking, MD as Consulting Physician (Gastroenterology)  Indicate any recent Medical Services you may have received from other than [facility] providers in  the past year (date may be approximate).     Assessment:   This is a routine wellness examination for [place].  Hearing/Vision screen Hearing Screening - Comments:: Denies hearing difficulties   Vision Screening - Comments:: Denies vision issues.    Goals Addressed               This Visit's Progress     Patient Stated (pt-stated)        To lose weight       Depression Screen     03/12/2024    2:30 PM 10/31/2023    1:08 PM 08/07/2023   10:02 AM 06/05/2023   11:12 AM  PHQ 2/9 Scores  PHQ - 2 Score 0 0 0 0  PHQ- 9 Score 3       Fall Risk     03/12/2024    2:28 PM 10/31/2023    1:08 PM 08/07/2023   10:02 AM 06/05/2023   11:12 AM  Fall Risk   Falls in the past year? 0 0 0 0  Number falls in past yr: 0 0 0 0  Injury with Fall? 0 0 0 0  Risk for fall due to : No Fall Risks No Fall Risks Impaired balance/gait No Fall Risks  Follow up Falls prevention discussed;Falls evaluation completed Falls evaluation completed Falls evaluation completed Falls evaluation completed    MEDICARE RISK AT HOME:  Medicare Risk at Home Any stairs in or around the home?: Yes If so, are there any without handrails?: Yes Home free of loose throw rugs in walkways, pet beds, electrical cords, etc?: Yes Adequate lighting in your home to reduce risk of falls?: Yes Life alert?: No Use of a cane, walker or w/c?: No Grab bars in the bathroom?: Yes Shower chair or bench in shower?: Yes Elevated toilet seat or a handicapped toilet?: Yes  TIMED UP AND GO:  Was the test performed?  No  Cognitive Function: Declined: Patient declined cognitive screening,  but was able to answer questions in an accurate and timely manner. No cognitive impairments observed.        Immunizations Immunization History  Administered Date(s) Administered   Fluad Trivalent(High Dose 65+) 08/07/2023   Influenza Split 09/24/2012   Influenza,inj,Quad PF,6+ Mos 09/04/2013, 09/10/2015, 09/22/2016, 08/30/2017   Moderna  SARS-COV2 Booster Vaccination 10/14/2020   PFIZER(Purple Top)SARS-COV-2 Vaccination 01/02/2020, 01/22/2020   PNEUMOCOCCAL CONJUGATE-20 10/31/2023   Pneumococcal Polysaccharide-23 01/29/2013   Unspecified SARS-COV-2 Vaccination 09/25/2022    Screening Tests Health Maintenance  Topic Date Due   DTaP/Tdap/Td (1 - Tdap) Never done   Zoster Vaccines- Shingrix (1 of 2) Never done   Medicare Annual Wellness (AWV)  01/19/2015   COVID-19 Vaccine (4 - 2024-25 season) 08/12/2023   INFLUENZA VACCINE  07/11/2024   Pneumonia Vaccine 32+ Years old  Completed   DEXA SCAN  Completed   Hepatitis C Screening  Completed   HPV VACCINES  Aged Out   Colonoscopy  Discontinued    Health Maintenance  Health Maintenance Due  Topic Date Due   DTaP/Tdap/Td (1 - Tdap) Never done   Zoster Vaccines- Shingrix (1 of 2) Never done   Medicare Annual Wellness (AWV)  01/19/2015   COVID-19 Vaccine (4 - 2024-25 season) 08/12/2023   Health Maintenance Items Addressed: See Nurse Notes  Additional Screening:  Vision Screening: Recommended annual ophthalmology exams for early detection of glaucoma and other disorders of the eye.  Dental Screening: Recommended annual dental exams for proper oral hygiene  Community Resource Referral / Chronic Care Management: CRR required this visit?  No   CCM required this visit?  No     Plan:     I have personally reviewed and noted the following in the patient's chart:   Medical and social history Use of alcohol, tobacco or illicit drugs  Current medications and supplements including opioid prescriptions. Patient is not currently taking opioid prescriptions. Functional ability and status Nutritional status Physical activity Advanced directives List of other physicians Hospitalizations, surgeries, and ER visits in previous 12 months Vitals Screenings to include cognitive, depression, and falls Referrals and appointments  In addition, I have reviewed and  discussed with patient certain preventive protocols, quality metrics, and best practice recommendations. A written personalized care plan for preventive services as well as general preventive health recommendations were provided to patient.     Sallee Hogrefe L Malaka Ruffner, CMA   03/12/2024   After Visit Summary: (Mail) Due to this being a telephonic visit, the after visit summary with patients personalized plan was offered to patient via mail   Notes: Please refer to Routing Comments.

## 2024-03-19 ENCOUNTER — Ambulatory Visit: Admitting: Family Medicine

## 2024-03-19 ENCOUNTER — Encounter: Payer: Self-pay | Admitting: Family Medicine

## 2024-03-19 ENCOUNTER — Inpatient Hospital Stay: Admitting: Family Medicine

## 2024-03-19 VITALS — BP 108/72 | HR 73 | Temp 97.6°F | Ht 66.0 in | Wt 197.0 lb

## 2024-03-19 DIAGNOSIS — M542 Cervicalgia: Secondary | ICD-10-CM | POA: Insufficient documentation

## 2024-03-19 DIAGNOSIS — G4700 Insomnia, unspecified: Secondary | ICD-10-CM | POA: Diagnosis not present

## 2024-03-19 DIAGNOSIS — I10 Essential (primary) hypertension: Secondary | ICD-10-CM

## 2024-03-19 DIAGNOSIS — L930 Discoid lupus erythematosus: Secondary | ICD-10-CM | POA: Diagnosis not present

## 2024-03-19 DIAGNOSIS — I700 Atherosclerosis of aorta: Secondary | ICD-10-CM

## 2024-03-19 MED ORDER — TRAZODONE HCL 50 MG PO TABS: 50.0000 mg | ORAL_TABLET | Freq: Every day | ORAL | 1 refills | Status: DC

## 2024-03-19 NOTE — Assessment & Plan Note (Signed)
 Melatonin not helpful. Trial of Trazodone. Counseling on potential side effects. Discussed good sleep hygiene. Follow up in 6 weeks or sooner if needed.

## 2024-03-19 NOTE — Assessment & Plan Note (Signed)
 Seen on CT. Discussed significance of diagnosis and that I cannot tell her to what extent the disease is based on CT. Advised increased risk of heart attack and stroke.  Reports taking statin daily.

## 2024-03-19 NOTE — Assessment & Plan Note (Signed)
 Controlled. Reports good compliance with medications. She is followed by Dr. Sharyn Lull

## 2024-03-19 NOTE — Progress Notes (Signed)
 Subjective:     Patient ID: [name], female    DOB: 12/01/1946, 78 y.o.   MRN: [id number]  Chief Complaint  Patient presents with   Follow-up    F/u on [place] Carodid, in media tab    HPI   History of Present Illness          Dr. Sharyn Lull- cardiologist  Dr. Kathi Ludwig- rheumatologist  Dr. Mosetta Putt- oncologist    Vascular carotid US ordered due to pain of left lateral neck with certain head movements and hx of CAD. Results unremarkable.  Determined to be most likely MSK etiology.  Symptoms are not worsening.   New concern today regarding insomnia. Under more stress due to sick family member. Having trouble falling asleep. Dealing with fatigue worsened by lack of sleep. Requests medication. She has tried melatonin. Denies trying Trazodone.   HLD- on statin therapy   She saw Dr. Sharyn Lull in early March.   Anemia r/t iron def and B12 def   Iron infusions scheduled. Ordered by Dr. Mosetta Putt   May 8th scheduled to see GI.   Diverticulitis and took Augmentin   Lupus- on Plaquenil   HTN- On multiple antihypertensive medications, including amlodipine, metoprolol, and ramipril.    Health Maintenance Due  Topic Date Due   DTaP/Tdap/Td (1 - Tdap) Never done   Zoster Vaccines- Shingrix (1 of 2) Never done   COVID-19 Vaccine (4 - 2024-25 season) 08/12/2023    Past Medical History:  Diagnosis Date   Allergic rhinitis    Anginal pain (HCC)    Arthritis    Asthma    Breast cancer (HCC) 2015   Right Breast Cancer   Coronary artery disease    Emphysema of lung (HCC)    GERD (gastroesophageal reflux disease)    Hypertension    Lupus (systemic lupus erythematosus) (HCC)    Myocardial infarction [facility])     Past Surgical History:  Procedure Laterality Date   CORONARY STENT PLACEMENT  1995   MASTECTOMY Right 2015    Family History  Problem Relation Age of Onset   Allergies Mother    Heart disease Mother    Diabetes Mother    Hypertension Mother    Cancer Father         Lung Cancer   Cancer Sister 34       lung cancer    Cancer Maternal Aunt 15       colon cancer    Cancer Maternal Aunt 25       uterine cancer     Social History   Socioeconomic History   Marital status: Divorced    Spouse name: Not on file   Number of children: Not on file   Years of education: 12   Highest education level: Not on file  Occupational History   Occupation: retired from the school systems    Employer: RETIRED    Comment: Geologist, engineering  Tobacco Use   Smoking status: Light Smoker    Current packs/day: 0.00    Types: Cigarettes    Last attempt to quit: 04/10/2014    Years since quitting: 9.9   Smokeless tobacco: Never   Tobacco comments:    2 cigarettes a week  Vaping Use   Vaping status: Never Used  Substance and Sexual Activity   Alcohol use: Yes    Comment: occassional   Drug use: No   Sexual activity: Yes    Birth control/protection: Post-menopausal  Other Topics Concern   Not  on file  Social History Narrative   Pt is divorced and lives alone.    Regular exercise-no   Caffeine Use-yes   Social Drivers of Health   Financial Resource Strain: Low Risk  (03/12/2024)   Overall Financial Resource Strain (CARDIA)    Difficulty of Paying Living Expenses: Not very hard  Food Insecurity: No Food Insecurity (03/12/2024)   Hunger Vital Sign    Worried About Running Out of Food in the Last Year: Never true    Ran Out of Food in the Last Year: Never true  Transportation Needs: No Transportation Needs (03/12/2024)   PRAPARE - Administrator, Civil Service (Medical): No    Lack of Transportation (Non-Medical): No  Physical Activity: Inactive (03/12/2024)   Exercise Vital Sign    Days of Exercise per Week: 0 days    Minutes of Exercise per Session: 0 min  Stress: No Stress Concern Present (03/12/2024)   [employer] of Occupational Health - Occupational Stress Questionnaire    Feeling of Stress : Not at all  Social Connections: Moderately  Isolated (03/12/2024)   Social Connection and Isolation Panel [NHANES]    Frequency of Communication with Friends and Family: More than three times a week    Frequency of Social Gatherings with Friends and Family: Once a week    Attends Religious Services: More than 4 times per year    Active Member of [employer] or Organizations: No    Attends Banker Meetings: Never    Marital Status: Divorced  Catering manager Violence: Patient Unable To Answer (03/12/2024)   Humiliation, Afraid, Rape, and Kick questionnaire    Fear of Current or Ex-Partner: Patient unable to answer    Emotionally Abused: Patient unable to answer    Physically Abused: Patient unable to answer    Sexually Abused: Patient unable to answer    Outpatient Medications Prior to Visit  Medication Sig Dispense Refill   amLODipine (NORVASC) 5 MG tablet Take 5 mg by mouth daily.     aspirin EC 81 MG EC tablet Take 1 tablet (81 mg total) by mouth daily. 30 tablet 3   atorvastatin (LIPITOR) 20 MG tablet Take 20 mg by mouth daily.     hydroxychloroquine (PLAQUENIL) 200 MG tablet Take 200 mg by mouth 2 (two) times daily.     metoprolol succinate (TOPROL-XL) 50 MG 24 hr tablet Take 50 mg by mouth daily. Take with or immediately following a meal.     nitroGLYCERIN (NITROSTAT) 0.4 MG SL tablet Place 0.4 mg under the tongue every 5 (five) minutes as needed for chest pain. Reported on 01/11/2016     ondansetron (ZOFRAN-ODT) 4 MG disintegrating tablet 4mg  ODT q4 hours prn nausea/vomit 10 tablet 0   pantoprazole (PROTONIX) 40 MG tablet Take 40 mg by mouth daily.  3   ramipril (ALTACE) 10 MG capsule Take 10 mg by mouth 2 (two) times daily.     meloxicam (MOBIC) 15 MG tablet Take 15 mg by mouth daily. (Patient not taking: Reported on 03/19/2024)     amoxicillin-clavulanate (AUGMENTIN) 875-125 MG tablet Take 1 tablet by mouth every 12 (twelve) hours. (Patient not taking: Reported on 03/12/2024) 20 tablet 0   HYDROcodone-acetaminophen  (NORCO/VICODIN) 5-325 MG tablet Take 1 tablet by mouth every 4 (four) hours as needed for moderate pain (pain score 4-6). (Patient not taking: Reported on 03/19/2024) 10 tablet 0   No facility-administered medications prior to visit.    Allergies  Allergen Reactions  Codeine Other (See Comments)   Lipitor [Atorvastatin] Other (See Comments)    Pt states med makes her feel "loopy"    Review of Systems  Constitutional:  Positive for malaise/fatigue. Negative for chills and fever.  HENT:  Negative for ear pain and tinnitus.   Respiratory:  Negative for cough and shortness of breath.   Cardiovascular:  Negative for chest pain, palpitations and leg swelling.  Gastrointestinal:  Negative for abdominal pain, constipation, diarrhea, nausea and vomiting.  Genitourinary:  Negative for dysuria, frequency and urgency.  Musculoskeletal:  Positive for neck pain. Negative for falls.  Neurological:  Negative for dizziness, focal weakness and headaches.  Psychiatric/Behavioral:  Positive for depression. Negative for substance abuse and suicidal ideas. The patient is nervous/anxious and has insomnia.        Objective:    Physical Exam Constitutional:      General: She is not in acute distress.    Appearance: She is not ill-appearing.  Eyes:     Extraocular Movements: Extraocular movements intact.     Conjunctiva/sclera: Conjunctivae normal.  Cardiovascular:     Rate and Rhythm: Normal rate.  Pulmonary:     Effort: Pulmonary effort is normal.  Musculoskeletal:     Cervical back: Normal range of motion and neck supple.  Skin:    General: Skin is warm and dry.  Neurological:     General: No focal deficit present.     Mental Status: She is alert and oriented to person, place, and time.  Psychiatric:        Mood and Affect: Mood normal.        Behavior: Behavior normal.        Thought Content: Thought content normal.      BP 108/72 (BP Location: Left Arm, Patient Position: Sitting)    Pulse 73   Temp 97.6 F (36.4 C) (Temporal)   Ht 5\' 6"  (1.676 m)   Wt 197 lb (89.4 kg)   SpO2 99%   BMI 31.80 kg/m  Wt Readings from Last 3 Encounters:  03/19/24 197 lb (89.4 kg)  03/12/24 198 lb (89.8 kg)  03/04/24 198 lb 1.6 oz (89.9 kg)       Assessment & Plan:   Problem List Items Addressed This Visit     Aortic atherosclerosis (HCC)   Seen on CT. Discussed significance of diagnosis and that I cannot tell her to what extent the disease is based on CT. Advised increased risk of heart attack and stroke.  Reports taking statin daily.       Essential hypertension   Controlled. Reports good compliance with medications. She is followed by Dr. Sharyn Lull        Insomnia   Melatonin not helpful. Trial of Trazodone. Counseling on potential side effects. Discussed good sleep hygiene. Follow up in 6 weeks or sooner if needed.       Lupus erythematosus   Neck pain on left side - Primary   Discussed most likely etiology MSK in nature. Treat conservatively.   VAS US CAROTID Result Date: 02/29/2024 Summary: Right Carotid: Velocities in the right ICA are consistent with a 1-39% stenosis. Left Carotid: The extracranial vessels were near-normal with only minimal wall               thickening or plaque. Vertebrals:  Bilateral vertebral arteries demonstrate antegrade flow. Subclavians: Normal flow hemodynamics were seen in bilateral subclavian              arteries. *See table(s) above  for measurements and observations.  Electronically signed by Charlton Haws MD on 02/29/2024 at 3:29:32 PM.    Final          I have discontinued Candy Sledge. [place]'s amoxicillin-clavulanate and HYDROcodone-acetaminophen. I am also having her start on traZODone. Additionally, I am having her maintain her ramipril, nitroGLYCERIN, aspirin EC, metoprolol succinate, pantoprazole, amLODipine, meloxicam, hydroxychloroquine, atorvastatin, and ondansetron.  Meds ordered this encounter  Medications   traZODone (DESYREL)  50 MG tablet    Sig: Take 1 tablet (50 mg total) by mouth at bedtime.    Dispense:  30 tablet    Refill:  1    Supervising Provider:   Hillard Danker A [[id number]]

## 2024-03-19 NOTE — Patient Instructions (Addendum)
 Try the trazodone for sleep but be careful. Make sure you allow 7-8 hours for sleep and do not drive with the medication.   Do not take anything over the counter for sleep   Follow up in 6 weeks or sooner if needed.

## 2024-03-19 NOTE — Assessment & Plan Note (Signed)
 Discussed most likely etiology MSK in nature. Treat conservatively.   VAS US CAROTID Result Date: 02/29/2024 Summary: Right Carotid: Velocities in the right ICA are consistent with a 1-39% stenosis. Left Carotid: The extracranial vessels were near-normal with only minimal wall               thickening or plaque. Vertebrals:  Bilateral vertebral arteries demonstrate antegrade flow. Subclavians: Normal flow hemodynamics were seen in bilateral subclavian              arteries. *See table(s) above for measurements and observations.  Electronically signed by Charlton Haws MD on 02/29/2024 at 3:29:32 PM.    Final

## 2024-03-21 ENCOUNTER — Ambulatory Visit (INDEPENDENT_AMBULATORY_CARE_PROVIDER_SITE_OTHER)

## 2024-03-21 VITALS — BP 149/64 | HR 66 | Temp 98.0°F | Resp 18 | Ht 67.0 in | Wt 195.0 lb

## 2024-03-21 DIAGNOSIS — D509 Iron deficiency anemia, unspecified: Secondary | ICD-10-CM

## 2024-03-21 MED ORDER — ACETAMINOPHEN 325 MG PO TABS
650.0000 mg | ORAL_TABLET | Freq: Once | ORAL | Status: AC
  Administered 2024-03-21: 650 mg via ORAL
  Filled 2024-03-21: qty 2

## 2024-03-21 MED ORDER — IRON SUCROSE 20 MG/ML IV SOLN
200.0000 mg | Freq: Once | INTRAVENOUS | Status: AC
  Administered 2024-03-21: 200 mg via INTRAVENOUS
  Filled 2024-03-21: qty 10

## 2024-03-21 MED ORDER — LORATADINE 10 MG PO TABS
10.0000 mg | ORAL_TABLET | Freq: Every day | ORAL | Status: DC
  Administered 2024-03-21: 10 mg via ORAL
  Filled 2024-03-21: qty 1

## 2024-03-21 NOTE — Progress Notes (Signed)
 Diagnosis: Iron Deficiency Anemia  Provider:  Chilton Greathouse MD  Procedure: IV Push  IV Type: Peripheral, IV Location: R Antecubital  Venofer (Iron Sucrose), Dose: 200 mg  Post Infusion IV Care: Observation period completed and Peripheral IV Discontinued  Discharge: Condition: Good, Destination: Home . AVS Provided  Performed by:  Rico Ala, LPN

## 2024-03-26 ENCOUNTER — Encounter: Payer: Self-pay | Admitting: Hematology

## 2024-03-27 DIAGNOSIS — M1990 Unspecified osteoarthritis, unspecified site: Secondary | ICD-10-CM | POA: Diagnosis not present

## 2024-03-27 DIAGNOSIS — M329 Systemic lupus erythematosus, unspecified: Secondary | ICD-10-CM | POA: Diagnosis not present

## 2024-03-27 DIAGNOSIS — G629 Polyneuropathy, unspecified: Secondary | ICD-10-CM | POA: Diagnosis not present

## 2024-03-27 DIAGNOSIS — M109 Gout, unspecified: Secondary | ICD-10-CM | POA: Diagnosis not present

## 2024-03-31 NOTE — Assessment & Plan Note (Signed)
-   She was referred to us  in March 2025 for iron  deficient anemia -Due to poor response to oral iron , I started her on IV iron .

## 2024-03-31 NOTE — Assessment & Plan Note (Signed)
-   On monthly B12 injection

## 2024-03-31 NOTE — Assessment & Plan Note (Signed)
 multifocal invasive ductal carcinoma, pT1aN0M0, stage IA, ER+/PR+, and background DCIS  -diagnosed in 05/2014 -She is status post mastectomy, received adjuvant exemestane  but did not follow up closely

## 2024-04-01 ENCOUNTER — Inpatient Hospital Stay

## 2024-04-01 ENCOUNTER — Inpatient Hospital Stay: Attending: Hematology | Admitting: Hematology

## 2024-04-01 VITALS — BP 134/80 | HR 80 | Temp 97.9°F | Resp 20 | Ht 67.0 in | Wt 194.2 lb

## 2024-04-01 DIAGNOSIS — E538 Deficiency of other specified B group vitamins: Secondary | ICD-10-CM | POA: Diagnosis not present

## 2024-04-01 DIAGNOSIS — D509 Iron deficiency anemia, unspecified: Secondary | ICD-10-CM | POA: Diagnosis not present

## 2024-04-01 DIAGNOSIS — Z170 Estrogen receptor positive status [ER+]: Secondary | ICD-10-CM | POA: Diagnosis not present

## 2024-04-01 DIAGNOSIS — D500 Iron deficiency anemia secondary to blood loss (chronic): Secondary | ICD-10-CM

## 2024-04-01 DIAGNOSIS — Z853 Personal history of malignant neoplasm of breast: Secondary | ICD-10-CM | POA: Insufficient documentation

## 2024-04-01 DIAGNOSIS — Z9011 Acquired absence of right breast and nipple: Secondary | ICD-10-CM | POA: Insufficient documentation

## 2024-04-01 DIAGNOSIS — C50411 Malignant neoplasm of upper-outer quadrant of right female breast: Secondary | ICD-10-CM | POA: Diagnosis not present

## 2024-04-01 LAB — CBC WITH DIFFERENTIAL/PLATELET
Abs Immature Granulocytes: 0.02 10*3/uL (ref 0.00–0.07)
Basophils Absolute: 0.1 10*3/uL (ref 0.0–0.1)
Basophils Relative: 1 %
Eosinophils Absolute: 0.3 10*3/uL (ref 0.0–0.5)
Eosinophils Relative: 3 %
HCT: 32.2 % — ABNORMAL LOW (ref 36.0–46.0)
Hemoglobin: 9.4 g/dL — ABNORMAL LOW (ref 12.0–15.0)
Immature Granulocytes: 0 %
Lymphocytes Relative: 20 %
Lymphs Abs: 1.5 10*3/uL (ref 0.7–4.0)
MCH: 22.8 pg — ABNORMAL LOW (ref 26.0–34.0)
MCHC: 29.2 g/dL — ABNORMAL LOW (ref 30.0–36.0)
MCV: 78 fL — ABNORMAL LOW (ref 80.0–100.0)
Monocytes Absolute: 0.5 10*3/uL (ref 0.1–1.0)
Monocytes Relative: 7 %
Neutro Abs: 5.2 10*3/uL (ref 1.7–7.7)
Neutrophils Relative %: 69 %
Platelets: 362 10*3/uL (ref 150–400)
RBC: 4.13 MIL/uL (ref 3.87–5.11)
RDW: 18.8 % — ABNORMAL HIGH (ref 11.5–15.5)
WBC: 7.6 10*3/uL (ref 4.0–10.5)
nRBC: 0 % (ref 0.0–0.2)

## 2024-04-01 LAB — FERRITIN: Ferritin: 61 ng/mL (ref 11–307)

## 2024-04-01 LAB — VITAMIN B12: Vitamin B-12: 268 pg/mL (ref 180–914)

## 2024-04-01 NOTE — Progress Notes (Signed)
 [facility]   [phone] [phone]   Clinic Follow up Note   Patient Care Team: [name], NP-C as PCP - General (Family Medicine) [name], MD as Consulting Physician (Cardiology) [name], MD as Consulting Physician (Gastroenterology) [facility]  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  Assessment & Plan Anemia Chronic anemia with recent hemoglobin level of 9.4 g/dL, slightly improved from 9.2 g/dL. She is receiving Venofer  (iron  sucrose) infusions, with a total of five planned treatments. The first infusion was two weeks ago, and she reports minimal improvement in energy levels. Platelet count has normalized, previously elevated due to iron  deficiency. B12 level was previously high, leading to cessation of B12 injections. Awaiting current B12 and iron  level results. Oral B12 supplementation may be needed if levels are low. Anticipated improvement in symptoms after two to three infusions, with further assessment after five infusions. Further workup planned if anemia persists. - Continue Venofer  infusions weekly until Apr 30, 2024. - Check B12 and iron  levels from today's blood work. - Consider oral B12 supplementation if levels are low. - Reassess anemia after completion of five Venofer  infusions. - Plan further workup if  anemia persists after infusions.  Breast cancer Breast cancer with no current issues discussed. Mammogram is due in October. - Ensure mammogram is scheduled for October.  Plan - Lab reviewed, no need of blood transfusion - She will continue weekly Venofer  200 mg at [employer] Office for 4 more doses - B12 injection today and continue monthly - Follow-up in 2 months with lab     SUMMARY OF ONCOLOGIC HISTORY: Oncology History Overview Note  Breast cancer of upper-outer quadrant of right female breast   Staging form: Breast, AJCC 7th Edition     Pathologic stage from 09/01/2014: Stage IA (T1a(m), N0, cM0) - Unsigned     Breast cancer of upper-outer quadrant of right female breast (HCC)  05/21/2014 Initial Biopsy   Right breast biopsy showed grade 2 DCIS with papillary features, ER/PR strongly positive.   05/21/2014 Receptors her2   ER 93%+, PR 100%   05/21/2014 Initial Diagnosis   Breast cancer of upper-outer quadrant of right female breast   09/01/2014 Surgery   Right mastectomy with sentinel lymph node biopsy, surgical margins were negative. Left lumpectomy showed fibrocystic changes.     PATHOLOGY REPORT Pathology: ACCESSION NUMBER:  [id number] RECEIVED: 09/01/2014 ORDERING PHYSICIAN:  Florina Husbands , MD PATIENT NAME:  Hall, Ana SURGICAL PATHOLOGY REPORT  FINAL PATHOLOGIC DIAGNOSIS MICROSCOPIC EXAMINATION AND DIAGNOSIS  A.  SENTINEL LYMPH NODE, RIGHT AXILLA, #1, EXCISION:      One benign lymph node (0/1).  B.  SENTINEL LYMPH NODE, RIGHT AXILLA, #2, EXCISION:       One benign lymph node (0/1).  C.  BREAST, RIGHT, MASTECTOMY:      Invasive ductal carcinoma, two foci, 0.3 and 0.2  cm, arising in background of ductal carcinoma in situ, intermediate-high nuclear grade, papillary growth pattern with necrosis and calcifications.      Previous biopsy site changes.      Margins negative for malignancy.      Unremarkable skin.      See comment and cancer  protocol.       D.  BREAST, LEFT, NEEDLE LOCALIZED LUMPECTOMY:      Fibrocystic changes including stromal fibrosis, cysts formation, apocrine metaplasia, columnar cell alteration and usual ductal hyperplasia. Negative for atypia and carcinoma.      Previous biopsy site changes.   10/11/2014 -  Anti-estrogen oral therapy   Exemestane  25 mg once daily since 10/2014. Stopped 08/2018. Restarted 05/25/20 for 1 more year.   02/09/2016 Imaging   MM Screening breast tomo Uni L IMPRESSION: No mammographic evidence of malignancy. A result letter of this screening mammogram will be mailed directly to the patient.   04/17/2017 Mammogram   negative   04/17/2017 Imaging   DEXA Scan measured at Fem[facility] Neck Right is 0.927 g/cm2 with a T-score of -0.8.      Discussed the use of AI scribe software for clinical note transcription with the patient, who gave verbal consent to proceed.  History of Present Illness [name], a 78 year old with a history of breast cancer, presents for follow-up of anemia. She reports persistent fatigue and lack of energy despite receiving an iron  infusion a couple of weeks ago. She is scheduled for another infusion tomorrow. She notes a slight improvement in energy levels following the infusion, but not significant. She has not experienced any adverse reactions to the infusion.  In addition to anemia, she has a history of B12 deficiency, for which she was receiving B12 injections. However, these injections were recently discontinued due to elevated B12 levels. She is not currently taking oral B12 supplements.     All other systems were reviewed with the patient and are negative.  MEDICAL HISTORY:  Past Medical History:  Diagnosis Date   Allergic rhinitis    Anginal pain (HCC)    Arthritis    Asthma    Breast cancer (HCC) 2015   Right Breast Cancer   Coronary artery disease    Emphysema of lung (HCC)    GERD (gastroesophageal reflux disease)    Hypertension    Lupus  (systemic lupus erythematosus) (HCC)    Myocardial infarction [facility])     SURGICAL HISTORY: Past Surgical History:  Procedure Laterality Date   CORONARY STENT PLACEMENT  1995   MASTECTOMY Right 2015    I have reviewed the social history and family history with the patient and they are unchanged from previous note.  ALLERGIES:  is allergic to codeine and lipitor  [atorvastatin ].  MEDICATIONS:  Current Outpatient Medications  Medication Sig Dispense Refill   amLODipine  (NORVASC ) 5 MG tablet Take 5 mg by mouth daily.     aspirin  EC 81 MG EC tablet Take 1 tablet (81 mg total) by mouth daily. 30 tablet 3   atorvastatin  (LIPITOR ) 20 MG tablet Take 20 mg by mouth daily.     hydroxychloroquine (PLAQUENIL) 200 MG tablet Take 200 mg by mouth 2 (two) times daily.     meloxicam (MOBIC) 15 MG tablet Take 15 mg by mouth daily. (Patient not taking: Reported on 03/19/2024)     metoprolol  succinate (TOPROL -XL) 50 MG 24 hr tablet Take 50 mg by mouth daily. Take with or immediately following a meal.     nitroGLYCERIN  (NITROSTAT ) 0.4 MG  SL tablet Place 0.4 mg under the tongue every 5 (five) minutes as needed for chest pain. Reported on 01/11/2016     ondansetron  (ZOFRAN -ODT) 4 MG disintegrating tablet 4mg  ODT q4 hours prn nausea/vomit 10 tablet 0   pantoprazole  (PROTONIX ) 40 MG tablet Take 40 mg by mouth daily.  3   ramipril  (ALTACE ) 10 MG capsule Take 10 mg by mouth 2 (two) times daily.     traZODone  (DESYREL ) 50 MG tablet Take 1 tablet (50 mg total) by mouth at bedtime. 30 tablet 1   No current facility-administered medications for this visit.    PHYSICAL EXAMINATION: ECOG PERFORMANCE STATUS: 1 - Symptomatic but completely ambulatory  Vitals:   04/01/24 1346  BP: 134/80  Pulse: 80  Resp: 20  Temp: 97.9 F (36.6 C)  SpO2: 99%   Wt Readings from Last 3 Encounters:  04/01/24 194 lb 3.2 oz (88.1 kg)  03/21/24 195 lb (88.5 kg)  03/19/24 197 lb (89.4 kg)     GENERAL:alert, no distress and  comfortable SKIN: skin color, texture, turgor are normal, no rashes or significant lesions EYES: normal, Conjunctiva are pink and non-injected, sclera clear Musculoskeletal:no cyanosis of digits and no clubbing  NEURO: alert & oriented x 3 with fluent speech, no focal motor/sensory deficits  Physical Exam    LABORATORY DATA:  I have reviewed the data as listed    Latest Ref Rng & Units 04/01/2024   12:49 PM 02/24/2024   10:58 PM 02/06/2024    3:23 PM  CBC  WBC 4.0 - 10.5 K/uL 7.6  6.9  6.6   Hemoglobin 12.0 - 15.0 g/dL 9.4  9.2  9.4   Hematocrit 36.0 - 46.0 % 32.2  32.1  32.0   Platelets 150 - 400 K/uL 362  448  414.0         Latest Ref Rng & Units 02/24/2024   10:58 PM 02/06/2024    3:23 PM 10/31/2023    1:59 PM  CMP  Glucose 70 - 99 mg/dL 086  578  91   BUN 8 - 23 mg/dL 13  14  15    Creatinine 0.44 - 1.00 mg/dL 4.69  6.29  5.28   Sodium 135 - 145 mmol/L 138  138  137   Potassium 3.5 - 5.1 mmol/L 3.8  4.4  3.6   Chloride 98 - 111 mmol/L 107  107  104   CO2 22 - 32 mmol/L 20  23  26    Calcium  8.9 - 10.3 mg/dL 9.4  9.1  9.6   Total Protein 6.5 - 8.1 g/dL 6.8  7.2  6.8   Total Bilirubin 0.0 - 1.2 mg/dL 0.4  0.3  0.2   Alkaline Phos 38 - 126 U/L 93  94  94   AST 15 - 41 U/L 20  16  17    ALT 0 - 44 U/L 13  10  14        RADIOGRAPHIC STUDIES: I have personally reviewed the radiological images as listed and agreed with the findings in the report. No results found.    No orders of the defined types were placed in this encounter.  All questions were answered. The patient knows to call the clinic with any problems, questions or concerns. No barriers to learning was detected. The total time spent in the appointment was 15 minutes.     Sonja Savoy, MD 04/01/2024

## 2024-04-02 ENCOUNTER — Ambulatory Visit

## 2024-04-02 VITALS — BP 131/70 | HR 59 | Temp 98.3°F | Resp 16 | Ht 67.0 in | Wt 194.2 lb

## 2024-04-02 DIAGNOSIS — D509 Iron deficiency anemia, unspecified: Secondary | ICD-10-CM

## 2024-04-02 DIAGNOSIS — D500 Iron deficiency anemia secondary to blood loss (chronic): Secondary | ICD-10-CM

## 2024-04-02 MED ORDER — SODIUM CHLORIDE 0.9 % IV BOLUS
250.0000 mL | Freq: Once | INTRAVENOUS | Status: AC
  Administered 2024-04-02: 250 mL via INTRAVENOUS
  Filled 2024-04-02: qty 250

## 2024-04-02 MED ORDER — ACETAMINOPHEN 325 MG PO TABS
650.0000 mg | ORAL_TABLET | Freq: Once | ORAL | Status: AC
  Administered 2024-04-02: 650 mg via ORAL
  Filled 2024-04-02: qty 2

## 2024-04-02 MED ORDER — LORATADINE 10 MG PO TABS
10.0000 mg | ORAL_TABLET | Freq: Every day | ORAL | Status: DC
  Administered 2024-04-02: 10 mg via ORAL
  Filled 2024-04-02: qty 1

## 2024-04-02 MED ORDER — IRON SUCROSE 20 MG/ML IV SOLN
200.0000 mg | Freq: Once | INTRAVENOUS | Status: AC
  Administered 2024-04-02: 200 mg via INTRAVENOUS
  Filled 2024-04-02: qty 10

## 2024-04-02 NOTE — Progress Notes (Signed)
 Diagnosis: Iron Deficiency Anemia  Provider:  Chilton Greathouse MD  Procedure: IV Push  IV Type: Peripheral, IV Location: R Antecubital  Venofer (Iron Sucrose), Dose: 200 mg  Post Infusion IV Care: Observation period completed and Peripheral IV Discontinued  Discharge: Condition: Good, Destination: Home . AVS Declined  Performed by:  Loney Hering, LPN

## 2024-04-09 ENCOUNTER — Ambulatory Visit (INDEPENDENT_AMBULATORY_CARE_PROVIDER_SITE_OTHER)

## 2024-04-09 VITALS — BP 137/65 | HR 64 | Temp 98.0°F | Resp 14 | Ht 67.0 in | Wt 192.6 lb

## 2024-04-09 DIAGNOSIS — D509 Iron deficiency anemia, unspecified: Secondary | ICD-10-CM

## 2024-04-09 DIAGNOSIS — D500 Iron deficiency anemia secondary to blood loss (chronic): Secondary | ICD-10-CM

## 2024-04-09 MED ORDER — IRON SUCROSE 20 MG/ML IV SOLN
200.0000 mg | Freq: Once | INTRAVENOUS | Status: AC
  Administered 2024-04-09: 200 mg via INTRAVENOUS
  Filled 2024-04-09: qty 10

## 2024-04-09 MED ORDER — ACETAMINOPHEN 325 MG PO TABS
650.0000 mg | ORAL_TABLET | Freq: Once | ORAL | Status: AC
  Administered 2024-04-09: 650 mg via ORAL
  Filled 2024-04-09: qty 2

## 2024-04-09 MED ORDER — LORATADINE 10 MG PO TABS
10.0000 mg | ORAL_TABLET | Freq: Every day | ORAL | Status: DC
  Administered 2024-04-09: 10 mg via ORAL
  Filled 2024-04-09: qty 1

## 2024-04-09 NOTE — Progress Notes (Signed)
 Diagnosis: Iron  Deficiency Anemia  Provider:  Praveen Mannam MD  Procedure: IV Push  IV Type: Peripheral, IV Location: R Antecubital  Venofer  (Iron  Sucrose), Dose: 200 mg  Post Infusion IV Care: Completed 15 mins observation per patient request  Discharge: Condition: Good, Destination: Home . AVS Provided  Performed by:  Natividad Balding, RN

## 2024-04-10 ENCOUNTER — Other Ambulatory Visit: Payer: Self-pay | Admitting: Family Medicine

## 2024-04-16 ENCOUNTER — Ambulatory Visit

## 2024-04-16 MED ORDER — ACETAMINOPHEN 325 MG PO TABS
650.0000 mg | ORAL_TABLET | Freq: Once | ORAL | Status: DC
Start: 2024-04-16 — End: 2024-04-16

## 2024-04-16 MED ORDER — LORATADINE 10 MG PO TABS: 10.0000 mg | ORAL_TABLET | Freq: Every day | ORAL | Status: DC

## 2024-04-17 ENCOUNTER — Encounter: Payer: Self-pay | Admitting: Physician Assistant

## 2024-04-17 ENCOUNTER — Ambulatory Visit: Admitting: Physician Assistant

## 2024-04-17 VITALS — BP 122/70 | HR 83 | Ht 67.0 in | Wt 192.0 lb

## 2024-04-17 DIAGNOSIS — K253 Acute gastric ulcer without hemorrhage or perforation: Secondary | ICD-10-CM

## 2024-04-17 DIAGNOSIS — D509 Iron deficiency anemia, unspecified: Secondary | ICD-10-CM

## 2024-04-17 DIAGNOSIS — K5732 Diverticulitis of large intestine without perforation or abscess without bleeding: Secondary | ICD-10-CM

## 2024-04-17 DIAGNOSIS — K449 Diaphragmatic hernia without obstruction or gangrene: Secondary | ICD-10-CM | POA: Diagnosis not present

## 2024-04-17 DIAGNOSIS — R7989 Other specified abnormal findings of blood chemistry: Secondary | ICD-10-CM

## 2024-04-17 DIAGNOSIS — K259 Gastric ulcer, unspecified as acute or chronic, without hemorrhage or perforation: Secondary | ICD-10-CM

## 2024-04-17 DIAGNOSIS — I2510 Atherosclerotic heart disease of native coronary artery without angina pectoris: Secondary | ICD-10-CM

## 2024-04-17 DIAGNOSIS — K219 Gastro-esophageal reflux disease without esophagitis: Secondary | ICD-10-CM

## 2024-04-17 DIAGNOSIS — M329 Systemic lupus erythematosus, unspecified: Secondary | ICD-10-CM | POA: Diagnosis not present

## 2024-04-17 MED ORDER — NA SULFATE-K SULFATE-MG SULF 17.5-3.13-1.6 GM/177ML PO SOLN
1.0000 | Freq: Once | ORAL | 0 refills | Status: AC
Start: 2024-04-17 — End: 2024-04-17

## 2024-04-17 MED ORDER — PANTOPRAZOLE SODIUM 40 MG PO TBEC: 40.0000 mg | DELAYED_RELEASE_TABLET | Freq: Two times a day (BID) | ORAL | 0 refills | Status: DC

## 2024-04-17 NOTE — Progress Notes (Signed)
 04/17/2024 [name] [id number] 1946-04-19  Referring provider: [name], NP-C Primary GI doctor: Dr. 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 02/2024 HGB 9.4, could not tolerate oral iron , received iron  infusions, plans total of 5 and on B12 injections, next one scheduled 14th  very suspicious for cameron lesions versus malignancy versus AVM's -She will require diagnostic work-up including an upper and lower endoscopy, appropriate for LEC, will do with first available -I would like to see what the blood counts and iron  look like at this point in time, will check CBC, with CMET, lipase - continue follow up with hematology for supportive care ER precautions discussed with the patient: severe weakness/dizziness, severe AB pain, vomit blood, shortness of breath or chest pain.  Risk of bowel prep, conscious sedation, and EGD and colonoscopy were discussed. Risks include but are not limited to dehydration, pain, bleeding, cardiopulmonary process, bowel perforation, or other possible adverse outcomes.. Treatment plan was discussed with patient, and agreed upon.  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 07/13/2023 CT abdomen  pelvis without contrast for left lower quadrant abdominal pain showed large hiatal hernia colonic diverticulosis CT renal stone 02/2024 for flank pain showed moderate hiatal hernia extensive sigmoid diverticulosis trace stranding proximal sigmoid colon with diverticulitis On protonxi 40 mg once daily, well controlled no GERD but some right chest pain - check lipase - stop Mobic - increase protonix  to 40 mg BID  Diverticulitis 02/2024 CT renal stone for flank pain showed moderate hiatal hernia and extensive sigmoid diverticulosis with proximal sigmoid colon stranding with diverticulitis Treated with augmentin  and states it is better - stop the mobic - call if any issues  Elevated LFTs 05/17/2021 RUQ US  normal CT abdomen pelvis 02/25/2024 normal liver normal spleen - stop ETOH - check CMET, likely fatty liver/lupus/medications  CAD S/p DES 2012/2014 Follows Dr. Glena Landau No chest pain or SOB  SLE On plaquenil and this is helping her joints Follows with Dr. Henrine Logan  DCIS of right breast Follows Dr. Maryalice Smaller  Patient Care Team: [name], NP-C as PCP - General (Family Medicine) [name], MD as Consulting Physician (Cardiology) [name], MD as Consulting Physician (Gastroenterology) [facility]  HISTORY OF PRESENT ILLNESS: 78 y.o. female with a past medical history listed below presents as a new patient for evaluation of hiatal hernia.   Patient previously seen by digestive health specialist Dr. Marrie Sizer, last seen 2016 for IDA diarrhea.  Discussed the use of AI scribe software for clinical note transcription with the patient, who gave verbal consent to proceed.  History of Present Illness   [name] is a 78 year old female with iron  deficiency anemia and hiatal hernia who presents  for evaluation of gastrointestinal symptoms.  She has a history of iron  deficiency anemia first identified in 2016, which led to a colonoscopy and endoscopy revealing a  large hiatal hernia and Cameron erosions, with eight polyps removed. A follow-up colonoscopy was recommended but not completed. Recently, in March, she experienced another episode of iron  deficiency anemia and is currently receiving iron  infusions due to intolerance to oral iron  supplements, which cause gastrointestinal upset.  She describes persistent soreness in the area of the hernia, with pain sometimes radiating to her back. No dark or black stools, attributing the dark color of her stool to the iron  supplements, and no blood in her stool.  In March, she was diagnosed with diverticulitis after presenting to the emergency room with significant pain in her side. The condition was treated, and she describes the pain as severe, to the extent that she could not twist or wipe herself.  Her medical history includes breast cancer, for which she underwent right breast removal. She has completed a course of B12 injections. She also has a history of coronary artery disease, with stents placed in 2012 and 2014. No current chest pain or shortness of breath.  She is currently on Plaquenil for lupus, taken twice daily, which affects her bones and joints, causing swelling and discomfort. She also has a history of arthritis, differentiated from rheumatoid arthritis despite her mother's history of the latter.  Her current medications include pantoprazole  (Protonix ) once daily for reflux, which she has been on for a long time, and meloxicam (Mobic) 15 mg, taken infrequently, about once a week, for pain.        She  reports that she has been smoking cigarettes. She has never used smokeless tobacco. She reports current alcohol use. She reports that she does not use drugs.  RELEVANT GI HISTORY, IMAGING AND LABS: Results   RADIOLOGY CT scan: Diverticulitis (02/2024)  DIAGNOSTIC Endoscopy: Iron  deficiency anemia, giant hernia, Cameron erosions, 8 polyps removed (2016)      CBC    Component Value Date/Time    WBC 7.6 04/01/2024 1249   RBC 4.13 04/01/2024 1249   HGB 9.4 (L) 04/01/2024 1249   HGB 14.1 08/30/2017 1405   HCT 32.2 (L) 04/01/2024 1249   HCT 42.1 08/30/2017 1405   PLT 362 04/01/2024 1249   PLT 259 08/30/2017 1405   MCV 78.0 (L) 04/01/2024 1249   MCV 93.8 08/30/2017 1405   MCH 22.8 (L) 04/01/2024 1249   MCHC 29.2 (L) 04/01/2024 1249   RDW 18.8 (H) 04/01/2024 1249   RDW 13.6 08/30/2017 1405   LYMPHSABS 1.5 04/01/2024 1249   LYMPHSABS 1.5 08/30/2017 1405   MONOABS 0.5 04/01/2024 1249   MONOABS 0.4 08/30/2017 1405   EOSABS 0.3 04/01/2024 1249   EOSABS 0.1 08/30/2017 1405   BASOSABS 0.1 04/01/2024 1249   BASOSABS 0.1 08/30/2017 1405   Recent Labs    06/05/23 1149 08/07/23 1033 10/31/23 1359 02/06/24 1523 02/24/24 2258 04/01/24 1249  HGB 11.7* 12.8 9.3* 9.4* 9.2* 9.4*    CMP     Component Value Date/Time   NA 138 02/24/2024 2258   NA 142 08/30/2017 1405   K 3.8 02/24/2024 2258   K 3.2 (L) 08/30/2017 1405   CL 107 02/24/2024 2258   CO2 20 (L) 02/24/2024 2258   CO2 20 (L) 08/30/2017 1405   GLUCOSE 102 (H) 02/24/2024 2258   GLUCOSE 108 08/30/2017 1405   BUN 13 02/24/2024 2258   BUN 9.6 08/30/2017 1405   CREATININE  0.80 02/24/2024 2258   CREATININE 0.8 08/30/2017 1405   CALCIUM  9.4 02/24/2024 2258   CALCIUM  9.4 08/30/2017 1405   PROT 6.8 02/24/2024 2258   PROT 6.9 08/30/2017 1405   ALBUMIN 3.9 02/24/2024 2258   ALBUMIN 3.6 08/30/2017 1405   AST 20 02/24/2024 2258   AST 27 08/30/2017 1405   ALT 13 02/24/2024 2258   ALT 22 08/30/2017 1405   ALKPHOS 93 02/24/2024 2258   ALKPHOS 86 08/30/2017 1405   BILITOT 0.4 02/24/2024 2258   BILITOT 0.32 08/30/2017 1405   GFRNONAA >60 02/24/2024 2258   GFRAA >60 02/27/2018 1048      Latest Ref Rng & Units 02/24/2024   10:58 PM 02/06/2024    3:23 PM 10/31/2023    1:59 PM  Hepatic Function  Total Protein 6.5 - 8.1 g/dL 6.8  7.2  6.8   Albumin 3.5 - 5.0 g/dL 3.9  4.2  4.2   AST 15 - 41 U/L 20  16  17    ALT 0 - 44  U/L 13  10  14    Alk Phosphatase 38 - 126 U/L 93  94  94   Total Bilirubin 0.0 - 1.2 mg/dL 0.4  0.3  0.2       Current Medications:    Current Outpatient Medications (Cardiovascular):    amLODipine  (NORVASC ) 5 MG tablet, Take 5 mg by mouth daily.   metoprolol  succinate (TOPROL -XL) 50 MG 24 hr tablet, Take 50 mg by mouth daily. Take with or immediately following a meal.   ramipril  (ALTACE ) 10 MG capsule, Take 10 mg by mouth 2 (two) times daily.   atorvastatin  (LIPITOR ) 20 MG tablet, Take 20 mg by mouth daily. (Patient not taking: Reported on 04/17/2024)   nitroGLYCERIN  (NITROSTAT ) 0.4 MG SL tablet, Place 0.4 mg under the tongue every 5 (five) minutes as needed for chest pain. Reported on 01/11/2016 (Patient not taking: Reported on 04/17/2024)   Current Outpatient Medications (Analgesics):    aspirin  EC 81 MG EC tablet, Take 1 tablet (81 mg total) by mouth daily.   meloxicam (MOBIC) 15 MG tablet, Take 15 mg by mouth daily.   Current Outpatient Medications (Other):    Na Sulfate-K Sulfate-Mg Sulfate concentrate (SUPREP) 17.5-3.13-1.6 GM/177ML SOLN, Take 1 kit (354 mLs total) by mouth once for 1 dose.   pantoprazole  (PROTONIX ) 40 MG tablet, Take 1 tablet (40 mg total) by mouth 2 (two) times daily before a meal.   traZODone  (DESYREL ) 50 MG tablet, TAKE 1 TABLET BY MOUTH EVERYDAY AT BEDTIME   hydroxychloroquine (PLAQUENIL) 200 MG tablet, Take 200 mg by mouth 2 (two) times daily. (Patient not taking: Reported on 04/17/2024)   ondansetron  (ZOFRAN -ODT) 4 MG disintegrating tablet, 4mg  ODT q4 hours prn nausea/vomit (Patient not taking: Reported on 04/17/2024)  Medical History:  Past Medical History:  Diagnosis Date   Allergic rhinitis    Anginal pain (HCC)    Arthritis    Asthma    Breast cancer (HCC) 2015   Right Breast Cancer   Coronary artery disease    Emphysema of lung (HCC)    GERD (gastroesophageal reflux disease)    Hypertension    Lupus (systemic lupus erythematosus) (HCC)     Myocardial infarction (HCC)    Allergies:  Allergies  Allergen Reactions   Codeine Other (See Comments)   Lipitor  [Atorvastatin ] Other (See Comments)    Pt states med makes her feel "loopy"     Surgical History:  She  has a past surgical history that  includes Coronary stent placement (1995) and Mastectomy (Right, 2015). Family History:  Her family history includes Allergies in her mother; Cancer in her father; Cancer (age of onset: 40) in her sister; Cancer (age of onset: 16) in her maternal aunt; Cancer (age of onset: 68) in her maternal aunt; Diabetes in her mother; Heart disease in her mother; Hypertension in her mother.  REVIEW OF SYSTEMS  : All other systems reviewed and negative except where noted in the History of Present Illness.  PHYSICAL EXAM: BP 122/70   Pulse 83   Ht 5\' 7"  (1.702 m)   Wt 192 lb (87.1 kg)   BMI 30.07 kg/m  Physical Exam   GENERAL APPEARANCE: Well nourished, in no apparent distress. HEENT: No cervical lymphadenopathy, unremarkable thyroid , sclerae anicteric, conjunctiva pink. RESPIRATORY: Respiratory effort normal, breath sounds equal bilaterally without rales, rhonchi, or wheezing. Lungs clear to auscultation bilaterally. CARDIO: Regular rate and rhythm with no murmurs, rubs, or gallops. Peripheral pulses intact. Heart sounds normal. ABDOMEN: Soft, non-distended, active bowel sounds in all four quadrants, no tenderness to palpation, no rebound, no mass appreciated. Abdomen normal. RECTAL: Declines. MUSCULOSKELETAL: Full range of motion, normal gait, without edema. SKIN: Dry, intact without rashes or lesions. No jaundice. NEURO: Alert, oriented, no focal deficits. PSYCH: Cooperative, normal mood and affect. EXTREMITIES: No edema in extremities.      Edmonia Gottron, PA-C 3:05 PM

## 2024-04-17 NOTE — Patient Instructions (Addendum)
 _______________________________________________________  If your blood pressure at your visit was 140/90 or greater, please contact your primary care physician to follow up on this.  _______________________________________________________  If you are age 78 or older, your body mass index should be between 23-30. Your Body mass index is 30.07 kg/m. If this is out of the aforementioned range listed, please consider follow up with your Primary Care Provider.  If you are age 83 or younger, your body mass index should be between 19-25. Your Body mass index is 30.07 kg/m. If this is out of the aformentioned range listed, please consider follow up with your Primary Care Provider.   ________________________________________________________  The [facility] providers would like to encourage you to use MYCHART to communicate with providers for non-urgent requests or questions.  Due to long hold times on the telephone, sending your provider a message by [facility] may be a faster and more efficient way to get a response.  Please allow 48 business hours for a response.  Please remember that this is for non-urgent requests.  _______________________________________________________  Your provider has requested that you go to the basement level for lab work before leaving today. Press "B" on the elevator. The lab is located at the first door on the left as you exit the elevator.  We have sent the following medications to your pharmacy for you to pick up at your convenience: Suprep Protonix   Stop the mobic Increase the pantoprazole  40 mg to TWICE a day Please take this medication 30 minutes to 1 hour before meals- this makes it more effective.  Avoid spicy and acidic foods Avoid fatty foods Limit your intake of coffee, tea, alcohol, and carbonated drinks Work to maintain a healthy weight Keep the head of the bed elevated at least 3 inches with blocks or a wedge pillow if you are having any nighttime  symptoms Stay upright for 2 hours after eating Avoid meals and snacks three to four hours before bedtime   Advised to go to the ER if there is any severe weakness, severe abdominal pain, vomit blood, dark red blood in your bowel movement, shortness of breath or chest pain.    You have been scheduled for an endoscopy and colonoscopy. Please follow the written instructions given to you at your visit today.  If you use inhalers (even only as needed), please bring them with you on the day of your procedure.  DO NOT TAKE 7 DAYS PRIOR TO TEST- Trulicity (dulaglutide) Ozempic, Wegovy (semaglutide) Mounjaro (tirzepatide) Bydureon Bcise (exanatide extended release)  DO NOT TAKE 1 DAY PRIOR TO YOUR TEST Rybelsus (semaglutide) Adlyxin (lixisenatide) Victoza (liraglutide) Byetta (exanatide) ___________________________________________________________________________  It was a pleasure to see you today!  Thank you for trusting me with your gastrointestinal care!

## 2024-04-18 ENCOUNTER — Encounter: Payer: Self-pay | Admitting: Nurse Practitioner

## 2024-04-23 ENCOUNTER — Ambulatory Visit

## 2024-04-23 VITALS — BP 142/81 | HR 59 | Temp 98.0°F | Resp 16 | Ht 67.0 in | Wt 183.0 lb

## 2024-04-23 DIAGNOSIS — D500 Iron deficiency anemia secondary to blood loss (chronic): Secondary | ICD-10-CM

## 2024-04-23 DIAGNOSIS — D509 Iron deficiency anemia, unspecified: Secondary | ICD-10-CM | POA: Diagnosis not present

## 2024-04-23 MED ORDER — LORATADINE 10 MG PO TABS
10.0000 mg | ORAL_TABLET | Freq: Every day | ORAL | Status: DC
  Administered 2024-04-23: 10 mg via ORAL
  Filled 2024-04-23: qty 1

## 2024-04-23 MED ORDER — ACETAMINOPHEN 325 MG PO TABS
650.0000 mg | ORAL_TABLET | Freq: Once | ORAL | Status: AC
  Administered 2024-04-23: 650 mg via ORAL
  Filled 2024-04-23: qty 2

## 2024-04-23 MED ORDER — IRON SUCROSE 20 MG/ML IV SOLN
200.0000 mg | Freq: Once | INTRAVENOUS | Status: AC
  Administered 2024-04-23: 200 mg via INTRAVENOUS
  Filled 2024-04-23: qty 10

## 2024-04-23 NOTE — Progress Notes (Signed)
 Diagnosis: Iron Deficiency Anemia  Provider:  Chilton Greathouse MD  Procedure: IV Push  IV Type: Peripheral, IV Location: R Forearm  Venofer (Iron Sucrose), Dose: 200 mg  Post Infusion IV Care: Observation period completed and Peripheral IV Discontinued  Discharge: Condition: Good, Destination: Home . AVS Declined  Performed by:  Garnette Czech, RN

## 2024-04-24 ENCOUNTER — Encounter: Payer: Self-pay | Admitting: Internal Medicine

## 2024-04-30 ENCOUNTER — Ambulatory Visit (INDEPENDENT_AMBULATORY_CARE_PROVIDER_SITE_OTHER)

## 2024-04-30 VITALS — BP 164/87 | HR 66 | Temp 98.1°F | Resp 18 | Ht 67.0 in | Wt 191.0 lb

## 2024-04-30 DIAGNOSIS — D500 Iron deficiency anemia secondary to blood loss (chronic): Secondary | ICD-10-CM

## 2024-04-30 DIAGNOSIS — D509 Iron deficiency anemia, unspecified: Secondary | ICD-10-CM | POA: Diagnosis not present

## 2024-04-30 MED ORDER — LORATADINE 10 MG PO TABS
10.0000 mg | ORAL_TABLET | Freq: Every day | ORAL | Status: DC
  Administered 2024-04-30: 10 mg via ORAL
  Filled 2024-04-30: qty 1

## 2024-04-30 MED ORDER — IRON SUCROSE 20 MG/ML IV SOLN
200.0000 mg | Freq: Once | INTRAVENOUS | Status: AC
  Administered 2024-04-30: 200 mg via INTRAVENOUS
  Filled 2024-04-30: qty 10

## 2024-04-30 MED ORDER — ACETAMINOPHEN 325 MG PO TABS
650.0000 mg | ORAL_TABLET | Freq: Once | ORAL | Status: AC
  Administered 2024-04-30: 650 mg via ORAL
  Filled 2024-04-30: qty 2

## 2024-04-30 NOTE — Progress Notes (Signed)
 Diagnosis: Iron Deficiency Anemia  Provider:  Chilton Greathouse MD  Procedure: IV Push  IV Type: Peripheral, IV Location: L Antecubital  Venofer (Iron Sucrose), Dose: 200 mg  Post Infusion IV Care: Observation period completed and Peripheral IV Discontinued  Discharge: Condition: Good, Destination: Home . AVS Provided  Performed by:  Rico Ala, LPN

## 2024-05-01 ENCOUNTER — Ambulatory Visit: Admitting: Family Medicine

## 2024-05-01 ENCOUNTER — Inpatient Hospital Stay: Attending: Hematology

## 2024-05-06 ENCOUNTER — Telehealth: Payer: Self-pay | Admitting: Hematology

## 2024-05-08 ENCOUNTER — Other Ambulatory Visit (INDEPENDENT_AMBULATORY_CARE_PROVIDER_SITE_OTHER)

## 2024-05-08 ENCOUNTER — Encounter: Payer: Self-pay | Admitting: Internal Medicine

## 2024-05-08 ENCOUNTER — Ambulatory Visit: Admitting: Internal Medicine

## 2024-05-08 VITALS — BP 142/82 | HR 53 | Temp 97.9°F | Resp 13 | Ht 67.0 in | Wt 192.0 lb

## 2024-05-08 DIAGNOSIS — Z860100 Personal history of colon polyps, unspecified: Secondary | ICD-10-CM

## 2024-05-08 DIAGNOSIS — D124 Benign neoplasm of descending colon: Secondary | ICD-10-CM

## 2024-05-08 DIAGNOSIS — K635 Polyp of colon: Secondary | ICD-10-CM | POA: Diagnosis not present

## 2024-05-08 DIAGNOSIS — D125 Benign neoplasm of sigmoid colon: Secondary | ICD-10-CM | POA: Diagnosis not present

## 2024-05-08 DIAGNOSIS — K31819 Angiodysplasia of stomach and duodenum without bleeding: Secondary | ICD-10-CM | POA: Insufficient documentation

## 2024-05-08 DIAGNOSIS — K253 Acute gastric ulcer without hemorrhage or perforation: Secondary | ICD-10-CM | POA: Diagnosis not present

## 2024-05-08 DIAGNOSIS — Q2730 Arteriovenous malformation, site unspecified: Secondary | ICD-10-CM | POA: Insufficient documentation

## 2024-05-08 DIAGNOSIS — K3189 Other diseases of stomach and duodenum: Secondary | ICD-10-CM

## 2024-05-08 DIAGNOSIS — D509 Iron deficiency anemia, unspecified: Secondary | ICD-10-CM

## 2024-05-08 DIAGNOSIS — K219 Gastro-esophageal reflux disease without esophagitis: Secondary | ICD-10-CM | POA: Diagnosis not present

## 2024-05-08 DIAGNOSIS — D120 Benign neoplasm of cecum: Secondary | ICD-10-CM | POA: Diagnosis not present

## 2024-05-08 DIAGNOSIS — D121 Benign neoplasm of appendix: Secondary | ICD-10-CM

## 2024-05-08 DIAGNOSIS — K648 Other hemorrhoids: Secondary | ICD-10-CM

## 2024-05-08 DIAGNOSIS — D123 Benign neoplasm of transverse colon: Secondary | ICD-10-CM | POA: Diagnosis not present

## 2024-05-08 DIAGNOSIS — K449 Diaphragmatic hernia without obstruction or gangrene: Secondary | ICD-10-CM | POA: Diagnosis not present

## 2024-05-08 DIAGNOSIS — K2990 Gastroduodenitis, unspecified, without bleeding: Secondary | ICD-10-CM | POA: Diagnosis not present

## 2024-05-08 DIAGNOSIS — K257 Chronic gastric ulcer without hemorrhage or perforation: Secondary | ICD-10-CM | POA: Insufficient documentation

## 2024-05-08 DIAGNOSIS — K5730 Diverticulosis of large intestine without perforation or abscess without bleeding: Secondary | ICD-10-CM

## 2024-05-08 DIAGNOSIS — K2970 Gastritis, unspecified, without bleeding: Secondary | ICD-10-CM

## 2024-05-08 DIAGNOSIS — Z1211 Encounter for screening for malignant neoplasm of colon: Secondary | ICD-10-CM | POA: Diagnosis not present

## 2024-05-08 DIAGNOSIS — D127 Benign neoplasm of rectosigmoid junction: Secondary | ICD-10-CM | POA: Diagnosis not present

## 2024-05-08 DIAGNOSIS — K5732 Diverticulitis of large intestine without perforation or abscess without bleeding: Secondary | ICD-10-CM

## 2024-05-08 DIAGNOSIS — K319 Disease of stomach and duodenum, unspecified: Secondary | ICD-10-CM | POA: Diagnosis not present

## 2024-05-08 LAB — CBC
HCT: 42.4 % (ref 36.0–46.0)
Hemoglobin: 13.1 g/dL (ref 12.0–15.0)
MCHC: 31 g/dL (ref 30.0–36.0)
MCV: 83.2 fl (ref 78.0–100.0)
Platelets: 259 10*3/uL (ref 150.0–400.0)
RBC: 5.09 Mil/uL (ref 3.87–5.11)
RDW: 25.3 % — ABNORMAL HIGH (ref 11.5–15.5)
WBC: 6.5 10*3/uL (ref 4.0–10.5)

## 2024-05-08 MED ORDER — SODIUM CHLORIDE 0.9 % IV SOLN
500.0000 mL | INTRAVENOUS | Status: DC
Start: 2024-05-08 — End: 2024-05-08

## 2024-05-08 NOTE — Progress Notes (Signed)
 Pt's states no medical or surgical changes since previsit or office visit.

## 2024-05-08 NOTE — Op Note (Signed)
 [facility] Patient Name: [name] Procedure Date: 05/08/2024 2:47 PM MRN: [id number] Endoscopist: Kenney Peacemaker , MD, [id number] Age: 78 Referring MD:  Date of Birth: July 15, 1946 Gender: Female Account #: [id number] Procedure:                Colonoscopy Indications:              Iron  deficiency anemia, hx adenomatous polyps Medicines:                Monitored Anesthesia Care Procedure:                Pre-Anesthesia Assessment:                           - Prior to the procedure, a History and Physical                            was performed, and patient medications and                            allergies were reviewed. The patient's tolerance of                            previous anesthesia was also reviewed. The risks                            and benefits of the procedure and the sedation                            options and risks were discussed with the patient.                            All questions were answered, and informed consent                            was obtained. Prior Anticoagulants: The patient has                            taken no anticoagulant or antiplatelet agents. ASA                            Grade Assessment: III - A patient with severe                            systemic disease. After reviewing the risks and                            benefits, the patient was deemed in satisfactory                            condition to undergo the procedure.                           After obtaining informed consent, the colonoscope  was passed under direct vision. Throughout the                            procedure, the patient's blood pressure, pulse, and                            oxygen saturations were monitored continuously. The                            CF [id number] #[id number] was introduced through the anus                            and advanced to the the cecum, identified by                            appendiceal  orifice and ileocecal valve. The                            colonoscopy was performed without difficulty. The                            patient tolerated the procedure well. The quality                            of the bowel preparation was good. The ileocecal                            valve, appendiceal orifice, and rectum were                            photographed. The bowel preparation used was SUPREP                            via split dose instruction. Scope In: 3:21:22 PM Scope Out: 3:52:22 PM Scope Withdrawal Time: 0 hours 26 minutes 22 seconds  Total Procedure Duration: 0 hours 31 minutes 0 seconds  Findings:                 The perianal and digital rectal examinations were                            normal.                           An 8 to 10 mm polyp was found in the appendiceal                            orifice. The polyp was sessile. The polyp was                            removed with a cold snare. Resection and retrieval                            were complete. Verification of patient  identification for the specimen was done. Estimated                            blood loss was minimal.                           Nine sessile and semi-pedunculated polyps were                            found in the sigmoid colon, descending colon,                            transverse colon and cecum. The polyps were 2 to 10                            mm in size. These polyps were removed with a cold                            snare. Resection and retrieval were complete.                            Verification of patient identification for the                            specimen was done. Estimated blood loss was minimal.                           Multiple diverticula were found in the sigmoid                            colon, descending colon and transverse colon.                           Internal hemorrhoids were found.                           The  exam was otherwise without abnormality on                            direct and retroflexion views. Complications:            No immediate complications. Estimated Blood Loss:     Estimated blood loss was minimal. Impression:               - One 8 to 10 mm polyp at the appendiceal orifice,                            removed with a cold snare. Resected and retrieved.                           - Nine 2 to 10 mm polyps in the sigmoid colon, in                            the descending colon, in the transverse colon  and                            in the cecum, removed with a cold snare. Resected                            and retrieved.                           - Diverticulosis in the sigmoid colon, in the                            descending colon and in the transverse colon.                           - Internal hemorrhoids.                           - The examination was otherwise normal on direct                            and retroflexion views.                           - Personal history of colonic polyps. 8 removed                            2016 and at least 2 were advanced adenomas Recommendation:           - Patient has a contact number available for                            emergencies. The signs and symptoms of potential                            delayed complications were discussed with the                            patient. Return to normal activities tomorrow.                            Written discharge instructions were provided to the                            patient.                           - Resume previous diet.                           - Continue present medications.                           - Await pathology results.                           - Repeat colonoscopy is recommended for  surveillance. The colonoscopy date will be                            determined after pathology results from today's                            exam  become available for review. Kenney Peacemaker, MD 05/08/2024 4:18:50 PM This report has been signed electronically.

## 2024-05-08 NOTE — Patient Instructions (Addendum)
 The upper exam showed the hiatal hernia, AVM's (blood vessels on the surface) and inflammation in stomach and duodenum. Biopsies taken. All of these can contribute to low iron  and anemia.  You should stop meloxicam as it is causing some of the inflammation.  Colonoscopy showed multiple polyps (removed), diverticulosis and hemorrhoids.  I will contact you with results and plans.  Please go to lab today and have blood drawn.  I appreciate the opportunity to care for you. Kenney Peacemaker, MD, FACG  YOU HAD AN ENDOSCOPIC PROCEDURE TODAY AT THE [facility]:   Refer to the procedure report that was given to you for any specific questions about what was found during the examination.  If the procedure report does not answer your questions, please call your gastroenterologist to clarify.  If you requested that your care partner not be given the details of your procedure findings, then the procedure report has been included in a sealed envelope for you to review at your convenience later.  YOU SHOULD EXPECT: Some feelings of bloating in the abdomen. Passage of more gas than usual.  Walking can help get rid of the air that was put into your GI tract during the procedure and reduce the bloating. If you had a lower endoscopy (such as a colonoscopy or flexible sigmoidoscopy) you may notice spotting of blood in your stool or on the toilet paper. If you underwent a bowel prep for your procedure, you may not have a normal bowel movement for a few days.  Please Note:  You might notice some irritation and congestion in your nose or some drainage.  This is from the oxygen used during your procedure.  There is no need for concern and it should clear up in a day or so.  SYMPTOMS TO REPORT IMMEDIATELY:  Following lower endoscopy (colonoscopy or flexible sigmoidoscopy):  Excessive amounts of blood in the stool  Significant tenderness or worsening of abdominal pains  Swelling of the abdomen that is new,  acute  Fever of 100F or higher  Following upper endoscopy (EGD)  Vomiting of blood or coffee ground material  New chest pain or pain under the shoulder blades  Painful or persistently difficult swallowing  New shortness of breath  Fever of 100F or higher  Black, tarry-looking stools  For urgent or emergent issues, a gastroenterologist can be reached at any hour by calling [phone]. Do not use MyChart messaging for urgent concerns.    DIET:  We do recommend a small meal at first, but then you may proceed to your regular diet.  Drink plenty of fluids but you should avoid alcoholic beverages for 24 hours.  ACTIVITY:  You should plan to take it easy for the rest of today and you should NOT DRIVE or use heavy machinery until tomorrow (because of the sedation medicines used during the test).    FOLLOW UP: Our staff will call the number listed on your records the next business day following your procedure.  We will call around 7:15- 8:00 am to check on you and address any questions or concerns that you may have regarding the information given to you following your procedure. If we do not reach you, we will leave a message.     If any biopsies were taken you will be contacted by phone or by letter within the next 1-3 weeks.  Please call us  at [phone] if you have not heard about the biopsies in 3 weeks.    SIGNATURES/CONFIDENTIALITY:  You and/or your care partner have signed paperwork which will be entered into your electronic medical record.  These signatures attest to the fact that that the information above on your After Visit Summary has been reviewed and is understood.  Full responsibility of the confidentiality of this discharge information lies with you and/or your care-partner.

## 2024-05-08 NOTE — Progress Notes (Signed)
 History and Physical Interval Note:  05/08/2024 2:52 PM  [name]  has presented today for endoscopic procedure(s), with the diagnosis of  Encounter Diagnoses  Name Primary?   Iron  deficiency anemia, unspecified iron  deficiency anemia type Yes   Diverticulitis of colon   .  The various methods of evaluation and treatment have been discussed with the patient and/or family. After consideration of risks, benefits and other options for treatment, the patient has consented to  the endoscopic procedure(s).   The patient's history has been reviewed, patient examined, no change in status, stable for endoscopic procedure(s).  I have reviewed the patient's chart and labs.  Questions were answered to the patient's satisfaction.     Kenney Peacemaker, MD, [facility]

## 2024-05-08 NOTE — Op Note (Signed)
 [name] Patient Name: [name] Procedure Date: 05/08/2024 2:49 PM MRN: [id number] Endoscopist: Kenney Peacemaker , MD, [id number] Age: 78 Referring MD:  Date of Birth: 10-Dec-1946 Gender: Female Account #: [id number] Procedure:                Upper GI endoscopy Indications:              Iron  deficiency anemia, known [name] lesions Medicines:                Monitored Anesthesia Care Procedure:                Pre-Anesthesia Assessment:                           - Prior to the procedure, a History and Physical                            was performed, and patient medications and                            allergies were reviewed. The patient's tolerance of                            previous anesthesia was also reviewed. The risks                            and benefits of the procedure and the sedation                            options and risks were discussed with the patient.                            All questions were answered, and informed consent                            was obtained. Prior Anticoagulants: The patient has                            taken no anticoagulant or antiplatelet agents. ASA                            Grade Assessment: III - A patient with severe                            systemic disease. After reviewing the risks and                            benefits, the patient was deemed in satisfactory                            condition to undergo the procedure.                           After obtaining informed consent, the endoscope was  passed under direct vision. Throughout the                            procedure, the patient's blood pressure, pulse, and                            oxygen saturations were monitored continuously. The                            GIF [id number] #[id number] was introduced through the                            mouth, and advanced to the second part of duodenum.                            The  upper GI endoscopy was accomplished without                            difficulty. The patient tolerated the procedure                            well. 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 The hiatal narrowing was 43 cm from the                            incisors. The Z-line was 35 cm from the incisors.                           Patchy moderate inflammation characterized by                            erosions and erythema was found in the gastric body                            and in the gastric antrum. Biopsies were taken with                            a cold forceps for histology. Verification of                            patient identification for the specimen was done.                            Estimated blood loss was minimal.                           Two 2 to 3 mm angiodysplastic lesions with no  bleeding were found in the gastric body and in the                            gastric antrum.                           Patchy mild inflammation characterized by                            congestion (edema), erosions and erythema was found                            in the second portion of the duodenum.                           A single 2 mm angiodysplastic lesion without                            bleeding was found in the second portion of the                            duodenum.                           The exam was otherwise without abnormality.                           The cardia and gastric fundus were otherwise normal                            on retroflexion. Complications:            No immediate complications. Estimated Blood Loss:     Estimated blood loss was minimal. 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                           - Gastritis. Biopsied.                           - Two non-bleeding  angiodysplastic lesions in the                            stomach.                           - Duodenitis.                           - A single non-bleeding angiodysplastic lesion in                            the duodenum.                           - The examination was otherwise normal. Recommendation:           -  Patient has a contact number available for                            emergencies. The signs and symptoms of potential                            delayed complications were discussed with the                            patient. Return to normal activities tomorrow.                            Written discharge instructions were provided to the                            patient.                           - Resume previous diet.                           - Continue present medications.                           - Await pathology results.                           - See the other procedure note for documentation of                            additional recommendations.                           - try to stop meloxicam. stay on PPI. Need to                            consider ablation of AVM's (at hospital). Continue                            chronic parenteral iron .                           Will discuss possible surgery for the hernia - she                            is elderly but it is only cure. Kenney Peacemaker, MD 05/08/2024 4:09:24 PM This report has been signed electronically.

## 2024-05-08 NOTE — Progress Notes (Signed)
 Called to room to assist during endoscopic procedure.  Patient ID and intended procedure confirmed with present staff. Received instructions for my participation in the procedure from the performing physician.

## 2024-05-08 NOTE — Progress Notes (Signed)
 Sedate, gd SR, tolerated procedure well, VSS, report to RN

## 2024-05-09 ENCOUNTER — Ambulatory Visit: Payer: Self-pay | Admitting: Physician Assistant

## 2024-05-09 ENCOUNTER — Telehealth: Payer: Self-pay | Admitting: *Deleted

## 2024-05-09 NOTE — Telephone Encounter (Signed)
  Follow up Call-     05/08/2024    2:15 PM  Call back number  Post procedure Call Back phone  # [phone]  Permission to leave phone message Yes     Patient questions:  Do you have a fever, pain , or abdominal swelling? No. Pain Score  0 *  Have you tolerated food without any problems? Yes.    Have you been able to return to your normal activities? Yes.    Do you have any questions about your discharge instructions: Diet   No. Medications  No. Follow up visit  No.  Do you have questions or concerns about your Care? No.  Actions: * If pain score is 4 or above: No action needed, pain <4.

## 2024-05-12 ENCOUNTER — Inpatient Hospital Stay: Attending: Hematology

## 2024-05-12 ENCOUNTER — Telehealth: Payer: Self-pay | Admitting: Physician Assistant

## 2024-05-12 DIAGNOSIS — E538 Deficiency of other specified B group vitamins: Secondary | ICD-10-CM | POA: Insufficient documentation

## 2024-05-12 DIAGNOSIS — Z853 Personal history of malignant neoplasm of breast: Secondary | ICD-10-CM | POA: Diagnosis not present

## 2024-05-12 DIAGNOSIS — D509 Iron deficiency anemia, unspecified: Secondary | ICD-10-CM | POA: Diagnosis not present

## 2024-05-12 DIAGNOSIS — Z9011 Acquired absence of right breast and nipple: Secondary | ICD-10-CM | POA: Insufficient documentation

## 2024-05-12 LAB — CBC WITH DIFFERENTIAL/PLATELET
Abs Immature Granulocytes: 0.02 10*3/uL (ref 0.00–0.07)
Basophils Absolute: 0.1 10*3/uL (ref 0.0–0.1)
Basophils Relative: 1 %
Eosinophils Absolute: 0.1 10*3/uL (ref 0.0–0.5)
Eosinophils Relative: 2 %
HCT: 40.6 % (ref 36.0–46.0)
Hemoglobin: 12.5 g/dL (ref 12.0–15.0)
Immature Granulocytes: 0 %
Lymphocytes Relative: 24 %
Lymphs Abs: 1.5 10*3/uL (ref 0.7–4.0)
MCH: 26 pg (ref 26.0–34.0)
MCHC: 30.8 g/dL (ref 30.0–36.0)
MCV: 84.4 fL (ref 80.0–100.0)
Monocytes Absolute: 0.4 10*3/uL (ref 0.1–1.0)
Monocytes Relative: 7 %
Neutro Abs: 4.1 10*3/uL (ref 1.7–7.7)
Neutrophils Relative %: 66 %
Platelets: 188 10*3/uL (ref 150–400)
RBC: 4.81 MIL/uL (ref 3.87–5.11)
RDW: 21.8 % — ABNORMAL HIGH (ref 11.5–15.5)
WBC: 6.3 10*3/uL (ref 4.0–10.5)
nRBC: 0 % (ref 0.0–0.2)

## 2024-05-12 LAB — VITAMIN B12: Vitamin B-12: 404 pg/mL (ref 180–914)

## 2024-05-12 NOTE — Telephone Encounter (Signed)
Spoke to pt. Documented in result notes.  Pt verbalized understanding with all questions answered.

## 2024-05-12 NOTE — Telephone Encounter (Signed)
 Patient called and stated that she was returning a call back to [place]. Patient is requesting a call back. Please advise.

## 2024-05-13 LAB — SURGICAL PATHOLOGY

## 2024-05-13 LAB — FERRITIN: Ferritin: 167 ng/mL (ref 11–307)

## 2024-05-16 ENCOUNTER — Ambulatory Visit: Payer: Self-pay | Admitting: Internal Medicine

## 2024-06-02 ENCOUNTER — Telehealth: Payer: Self-pay | Admitting: Family Medicine

## 2024-06-02 NOTE — Telephone Encounter (Unsigned)
 Copied from CRM [id number]. Topic: Clinical - Medical Advice >> Jun 02, 2024  9:43 AM [name] wrote: Reason for CRM: Patient is calling stating she is having the same symptoms that she was having before when she went to the ER. Patient is requesting an antibiotic,  Amoxicillin  , patient would like a call back if this can be sent to her pharmacy.

## 2024-06-03 NOTE — Telephone Encounter (Signed)
 Called pt and advised vickie has not seen her for this issue so she would need to be seen. Pt states that is ok but she only wants to see vickie and is ok w the wait since she is booked this week. Pt states she can do appt after July 4th, booked avail appt for 7/9 for her to f/u w Vickie

## 2024-06-04 ENCOUNTER — Ambulatory Visit: Admitting: Internal Medicine

## 2024-06-05 ENCOUNTER — Inpatient Hospital Stay

## 2024-06-05 ENCOUNTER — Inpatient Hospital Stay: Admitting: Hematology

## 2024-06-05 ENCOUNTER — Encounter: Payer: Self-pay | Admitting: Hematology

## 2024-06-05 VITALS — BP 120/76 | HR 94 | Temp 97.5°F | Resp 15 | Ht 67.0 in | Wt 193.7 lb

## 2024-06-05 DIAGNOSIS — E538 Deficiency of other specified B group vitamins: Secondary | ICD-10-CM

## 2024-06-05 DIAGNOSIS — D500 Iron deficiency anemia secondary to blood loss (chronic): Secondary | ICD-10-CM | POA: Diagnosis not present

## 2024-06-05 DIAGNOSIS — Z9011 Acquired absence of right breast and nipple: Secondary | ICD-10-CM | POA: Diagnosis not present

## 2024-06-05 DIAGNOSIS — C50411 Malignant neoplasm of upper-outer quadrant of right female breast: Secondary | ICD-10-CM

## 2024-06-05 DIAGNOSIS — Z170 Estrogen receptor positive status [ER+]: Secondary | ICD-10-CM | POA: Diagnosis not present

## 2024-06-05 DIAGNOSIS — Z853 Personal history of malignant neoplasm of breast: Secondary | ICD-10-CM | POA: Diagnosis not present

## 2024-06-05 DIAGNOSIS — D509 Iron deficiency anemia, unspecified: Secondary | ICD-10-CM

## 2024-06-05 LAB — CBC WITH DIFFERENTIAL/PLATELET
Abs Immature Granulocytes: 0.01 10*3/uL (ref 0.00–0.07)
Basophils Absolute: 0.1 10*3/uL (ref 0.0–0.1)
Basophils Relative: 1 %
Eosinophils Absolute: 0.1 10*3/uL (ref 0.0–0.5)
Eosinophils Relative: 1 %
HCT: 39.9 % (ref 36.0–46.0)
Hemoglobin: 12.9 g/dL (ref 12.0–15.0)
Immature Granulocytes: 0 %
Lymphocytes Relative: 23 %
Lymphs Abs: 1.6 10*3/uL (ref 0.7–4.0)
MCH: 27.7 pg (ref 26.0–34.0)
MCHC: 32.3 g/dL (ref 30.0–36.0)
MCV: 85.6 fL (ref 80.0–100.0)
Monocytes Absolute: 0.6 10*3/uL (ref 0.1–1.0)
Monocytes Relative: 8 %
Neutro Abs: 4.9 10*3/uL (ref 1.7–7.7)
Neutrophils Relative %: 67 %
Platelets: 260 10*3/uL (ref 150–400)
RBC: 4.66 MIL/uL (ref 3.87–5.11)
RDW: 19 % — ABNORMAL HIGH (ref 11.5–15.5)
WBC: 7.2 10*3/uL (ref 4.0–10.5)
nRBC: 0 % (ref 0.0–0.2)

## 2024-06-05 NOTE — Progress Notes (Signed)
 [facility]   [phone] Fax:([phone]   Clinic Follow up Note   Patient Care Team: [name], NP-C as PCP - General (Family Medicine) [name], MD as Consulting Physician (Cardiology) [name], MD as Consulting Physician (Gastroenterology) [facility]  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  Assessment & Plan Iron  deficient anemia due to chronic blood loss Anemia previously attributed to chronic blood loss from the hiatal hernia and associated gastric inflammation has resolved after IV iron . Hemoglobin improved from 9.4 to 12.9 over the past two months following iron  treatment. Reports feeling significantly better. - Return for lab work in three months to monitor blood counts - Take oral B12 supplement daily (1000 mcg)  Breast cancer Breast cancer treated ten years ago. Due for routine mammogram in October, as the last mammogram was conducted in October of the previous year. - Schedule mammogram for October  Plan - She is clinically doing very well, lab reviewed, no anemia. - She was seen general surgeon and discuss hiatal hernia surgery - Lab every 3 months, follow-up in 6 months   SUMMARY OF ONCOLOGIC HISTORY: Oncology History Overview Note  Breast cancer of upper-outer quadrant of right female breast   Staging form: Breast, AJCC 7th  Edition     Pathologic stage from 09/01/2014: Stage IA (T1a(m), N0, cM0) - Unsigned     Breast cancer of upper-outer quadrant of right female breast (HCC)  05/21/2014 Initial Biopsy   Right breast biopsy showed grade 2 DCIS with papillary features, ER/PR strongly positive.   05/21/2014 Receptors her2   ER 93%+, PR 100%   05/21/2014 Initial Diagnosis   Breast cancer of upper-outer quadrant of right female breast   09/01/2014 Surgery   Right mastectomy with sentinel lymph node biopsy, surgical margins were negative. Left lumpectomy showed fibrocystic changes.     PATHOLOGY REPORT Pathology: ACCESSION NUMBER:  [id number] RECEIVED: 09/01/2014 ORDERING PHYSICIAN:  DAVED MARTINET , MD PATIENT NAME:  [name] SURGICAL PATHOLOGY REPORT  FINAL PATHOLOGIC DIAGNOSIS MICROSCOPIC EXAMINATION AND DIAGNOSIS  A.  SENTINEL LYMPH NODE, RIGHT AXILLA, #1, EXCISION:      One benign lymph node (0/1).  B.  SENTINEL LYMPH NODE, RIGHT AXILLA, #2, EXCISION:       One benign lymph node (0/1).  C.  BREAST, RIGHT, MASTECTOMY:      Invasive ductal carcinoma, two foci, 0.3 and 0.2 cm, arising in background of ductal carcinoma in situ, intermediate-high nuclear grade, papillary growth pattern with necrosis and calcifications.      Previous biopsy site changes.      Margins negative for malignancy.      Unremarkable skin.      See comment and cancer protocol.       D.  BREAST, LEFT, NEEDLE LOCALIZED LUMPECTOMY:      Fibrocystic changes including stromal fibrosis, cysts formation, apocrine metaplasia, columnar cell alteration and usual  ductal hyperplasia. Negative for atypia and carcinoma.      Previous biopsy site changes.   10/11/2014 -  Anti-estrogen oral therapy   Exemestane  25 mg once daily since 10/2014. Stopped 08/2018. Restarted 05/25/20 for 1 more year.   02/09/2016 Imaging   MM Screening breast tomo Uni L IMPRESSION: No mammographic evidence of malignancy. A result letter of  this screening mammogram will be mailed directly to the patient.   04/17/2017 Mammogram   negative   04/17/2017 Imaging   DEXA Scan measured at Femur Neck Right is 0.927 g/cm2 with a T-score of -0.8.      Discussed the use of AI scribe software for clinical note transcription with the patient, who gave verbal consent to proceed.  History of Present Illness [name] is a 78 year old female who presents for follow-up of anemia.  Her anemia has resolved, with hemoglobin levels improving from 9.4 two months ago to 12.9 currently. She attributes this improvement to iron  treatment, which included five iron  infusions without complications. Recent colonoscopy revealed diverticulosis and three polyps, all removed. Upper endoscopy showed significant gastric inflammation, large ulcers, and abnormal bleeding blood vessels, but no cancer. She has not been on B12 injections for the past two to three months as her levels were previously high. No recent bleeding.     All other systems were reviewed with the patient and are negative.  MEDICAL HISTORY:  Past Medical History:  Diagnosis Date   Allergic rhinitis    Anemia    Anginal pain (HCC)    Arthritis    Asthma    Breast cancer (HCC) 2015   Right Breast Cancer   Cataract    Coronary artery disease    Emphysema of lung (HCC)    GERD (gastroesophageal reflux disease)    Hypertension    Lupus (systemic lupus erythematosus) (HCC)    Myocardial infarction (HCC)     SURGICAL HISTORY: Past Surgical History:  Procedure Laterality Date   CORONARY STENT PLACEMENT  1995   MASTECTOMY Right 2015    I have reviewed the social history and family history with the patient and they are unchanged from previous note.  ALLERGIES:  is allergic to codeine and lipitor  [atorvastatin ].  MEDICATIONS:  Current Outpatient Medications  Medication Sig Dispense Refill   ALPRAZolam  (XANAX ) 0.25 MG tablet 1 tablet Orally Twice a day     amLODipine   (NORVASC ) 5 MG tablet Take 5 mg by mouth daily.     aspirin  EC 81 MG EC tablet Take 1 tablet (81 mg total) by mouth daily. 30 tablet 3   atorvastatin  (LIPITOR ) 20 MG tablet Take 20 mg by mouth daily. (Patient not taking: Reported on 05/08/2024)     hydroxychloroquine (PLAQUENIL) 200 MG tablet Take 200 mg by mouth 2 (two) times daily.     meloxicam (MOBIC) 15 MG tablet Take 15 mg by mouth daily.     metoprolol  succinate (TOPROL -XL) 50 MG 24 hr tablet Take 50 mg by mouth daily. Take with or immediately following a meal.     nitroGLYCERIN  (NITROSTAT ) 0.4 MG SL tablet Place 0.4 mg under the tongue every 5 (five) minutes as needed for chest pain. Reported on 01/11/2016 (Patient not taking: Reported on 04/17/2024)     ondansetron  (ZOFRAN -ODT) 4 MG disintegrating tablet 4mg  ODT q4 hours prn nausea/vomit 10 tablet 0   pantoprazole  (PROTONIX ) 40 MG tablet Take 1 tablet (40 mg total) by mouth 2 (two) times daily before a meal. 180 tablet 0  predniSONE  (DELTASONE ) 10 MG tablet 1 tablet with food or milk Orally Once a day for 30 days     ramipril  (ALTACE ) 10 MG capsule Take 10 mg by mouth 2 (two) times daily.     traZODone  (DESYREL ) 50 MG tablet TAKE 1 TABLET BY MOUTH EVERYDAY AT BEDTIME 90 tablet 0   No current facility-administered medications for this visit.    PHYSICAL EXAMINATION: ECOG PERFORMANCE STATUS: 0 - Asymptomatic  Vitals:   06/05/24 1425  BP: 120/76  Pulse: 94  Resp: 15  Temp: (!) 97.5 F (36.4 C)  SpO2: 99%   Wt Readings from Last 3 Encounters:  06/05/24 193 lb 11.2 oz (87.9 kg)  05/08/24 192 lb (87.1 kg)  04/30/24 191 lb (86.6 kg)     GENERAL:alert, no distress and comfortable SKIN: skin color, texture, turgor are normal, no rashes or significant lesions EYES: normal, Conjunctiva are pink and non-injected, sclera clear Musculoskeletal:no cyanosis of digits and no clubbing  NEURO: alert & oriented x 3 with fluent speech, no focal motor/sensory deficits  Physical  Exam    LABORATORY DATA:  I have reviewed the data as listed    Latest Ref Rng & Units 06/05/2024    1:59 PM 05/12/2024    2:23 PM 05/08/2024    4:40 PM  CBC  WBC 4.0 - 10.5 K/uL 7.2  6.3  6.5   Hemoglobin 12.0 - 15.0 g/dL 87.0  87.4  86.8   Hematocrit 36.0 - 46.0 % 39.9  40.6  42.4   Platelets 150 - 400 K/uL 260  188  259.0         Latest Ref Rng & Units 02/24/2024   10:58 PM 02/06/2024    3:23 PM 10/31/2023    1:59 PM  CMP  Glucose 70 - 99 mg/dL 897  899  91   BUN 8 - 23 mg/dL 13  14  15    Creatinine 0.44 - 1.00 mg/dL 9.19  9.11  9.07   Sodium 135 - 145 mmol/L 138  138  137   Potassium 3.5 - 5.1 mmol/L 3.8  4.4  3.6   Chloride 98 - 111 mmol/L 107  107  104   CO2 22 - 32 mmol/L 20  23  26    Calcium  8.9 - 10.3 mg/dL 9.4  9.1  9.6   Total Protein 6.5 - 8.1 g/dL 6.8  7.2  6.8   Total Bilirubin 0.0 - 1.2 mg/dL 0.4  0.3  0.2   Alkaline Phos 38 - 126 U/L 93  94  94   AST 15 - 41 U/L 20  16  17    ALT 0 - 44 U/L 13  10  14        RADIOGRAPHIC STUDIES: I have personally reviewed the radiological images as listed and agreed with the findings in the report. No results found.    No orders of the defined types were placed in this encounter.  All questions were answered. The patient knows to call the clinic with any problems, questions or concerns. No barriers to learning was detected. The total time spent in the appointment was 20 minutes, including review of chart and various tests results, discussions about plan of care and coordination of care plan     Onita Mattock, MD 06/05/2024

## 2024-06-05 NOTE — Assessment & Plan Note (Signed)
-   On monthly B12 injection

## 2024-06-05 NOTE — Assessment & Plan Note (Signed)
-   She was referred to us  in March 2025 for iron  deficient anemia -Due to poor response to oral iron , I started her on IV iron .

## 2024-06-05 NOTE — Assessment & Plan Note (Signed)
 multifocal invasive ductal carcinoma, pT1aN0M0, stage IA, ER+/PR+, and background DCIS  -diagnosed in 05/2014 -She is status post mastectomy, received adjuvant exemestane  but did not follow up closely

## 2024-06-18 ENCOUNTER — Ambulatory Visit: Admitting: Family Medicine

## 2024-07-02 ENCOUNTER — Ambulatory Visit: Admitting: Family Medicine

## 2024-07-09 ENCOUNTER — Ambulatory Visit (INDEPENDENT_AMBULATORY_CARE_PROVIDER_SITE_OTHER): Admitting: Gastroenterology

## 2024-07-09 ENCOUNTER — Encounter: Payer: Self-pay | Admitting: Gastroenterology

## 2024-07-09 ENCOUNTER — Telehealth: Payer: Self-pay | Admitting: Gastroenterology

## 2024-07-09 VITALS — BP 142/90 | HR 82 | Temp 98.5°F | Ht 67.0 in | Wt 190.0 lb

## 2024-07-09 DIAGNOSIS — Q2730 Arteriovenous malformation, site unspecified: Secondary | ICD-10-CM | POA: Diagnosis not present

## 2024-07-09 DIAGNOSIS — K5732 Diverticulitis of large intestine without perforation or abscess without bleeding: Secondary | ICD-10-CM | POA: Insufficient documentation

## 2024-07-09 MED ORDER — AMOXICILLIN-POT CLAVULANATE 875-125 MG PO TABS: 1.0000 | ORAL_TABLET | Freq: Two times a day (BID) | ORAL | 0 refills | Status: DC

## 2024-07-09 MED ORDER — ONDANSETRON 4 MG PO TBDP: ORAL_TABLET | ORAL | 1 refills | Status: AC

## 2024-07-09 NOTE — Telephone Encounter (Signed)
 Inbound call from patient stating that she was scheduled today at her office visit for a EGD. Patient states she forgot to ask some questions about the procedure and about her Hernia. Please advise.

## 2024-07-09 NOTE — Progress Notes (Addendum)
 07/09/2024 [name] [id number] 1946-12-10   HISTORY OF PRESENT ILLNESS: This is a 78 year old female who is a patient of Dr. Darilyn.  She has history of recurrent iron  deficiency anemia first identified in 2016.  She is here for follow-up after EGD and colonoscopy here in May.  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  Plan was to consider repeat EGD at [facility] for ablation.  Also needs to consider surgical evaluation for hiatal hernia repair.  Currently her hemoglobin is normal and iron  studies are normal following what she says was 5 IV iron  infusions.  She is also complaining of abdominal pain that began the day before yesterday.  She says it feels the same as it did back in March when she was diagnosed with diverticulitis.  She was treated with 10 days of Augmentin  at that time.  Bowel movements are regular.  Says that she has abdominal pain with moving her bowels.  Colonoscopy 04/2024:  - One 8 to 10 mm polyp at the appendiceal orifice, removed with a cold snare. Resected and retrieved. - Nine 2 to 10 mm polyps in the sigmoid colon, in the descending colon, in the transverse colon and in the cecum, removed with a cold snare. Resected and retrieved. - Diverticulosis in the sigmoid colon, in the descending colon and in the transverse colon. - Internal hemorrhoids. - The examination was otherwise normal on direct and retroflexion views. - Personal history of colonic polyps. 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 - Gastritis. Biopsied. 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 - Duodenitis. - A single non- bleeding angiodysplastic lesion in the duodenum. - The examination was otherwise normal.  1. Surgical [P], gastric antrum :      - MILD REACTIVE GASTROPATHY.      - NEGATIVE  FOR H. PYLORI ON H&E STAIN      - NO INTESTINAL METAPLASIA, DYSPLASIA, OR MALIGNANCY.       2. Surgical [P], gastric body :      - GASTRIC MUCOSA WITH NO SPECIFIC PATHOLOGIC CHANGES      - NEGATIVE FOR H. PYLORI ON H&E STAIN      - NEGATIVE FOR INTESTINAL METAPLASIA, DYSPLASIA OR MALIGNANCY       3. Surgical [P], colon, transverse, cecal, polyp (6) :      - TUBULAR ADENOMA(S).      - NO HIGH GRADE DYSPLASIA OR MALIGNANCY.      - SESSILE SERRATED POLYP.      - NO DYSPLASIA OR MALIGNANCY.       4. Surgical [P], colon, appendix, polyp (1) :      - TUBULAR ADENOMA.      - NO HIGH GRADE DYSPLASIA OR MALIGNANCY.       5. Surgical [P], colon, descending, sigmoid, polyp (3) :      - TUBULAR ADENOMA(S).      - NO HIGH GRADE DYSPLASIA OR MALIGNANCY.    Past Medical History:  Diagnosis Date   Allergic rhinitis    Anemia    Anginal pain (HCC)    Arthritis    Asthma    Breast cancer (HCC) 2015   Right Breast Cancer   Cataract    Coronary artery disease    Emphysema of lung (HCC)  GERD (gastroesophageal reflux disease)    Hypertension    Lupus (systemic lupus erythematosus) (HCC)    Myocardial infarction [facility])    Past Surgical History:  Procedure Laterality Date   CORONARY STENT PLACEMENT  1995   MASTECTOMY Right 2015    reports that she has been smoking cigarettes. She has never used smokeless tobacco. She reports current alcohol use. She reports that she does not use drugs. family history includes Allergies in her mother; Cancer in her father; Cancer (age of onset: 38) in her sister; Cancer (age of onset: 63) in her maternal aunt; Cancer (age of onset: 49) in her maternal aunt; Colon cancer in her maternal aunt; Diabetes in her mother; Heart disease in her mother; Hypertension in her mother. Allergies  Allergen Reactions   Codeine Other (See Comments)   Lipitor  [Atorvastatin ] Other (See Comments)    Pt states med makes her feel loopy      Outpatient Encounter  Medications as of 07/09/2024  Medication Sig   amLODipine  (NORVASC ) 5 MG tablet Take 5 mg by mouth daily.   aspirin  EC 81 MG EC tablet Take 1 tablet (81 mg total) by mouth daily.   atorvastatin  (LIPITOR ) 20 MG tablet Take 20 mg by mouth daily.   hydroxychloroquine (PLAQUENIL) 200 MG tablet Take 200 mg by mouth 2 (two) times daily.   meloxicam (MOBIC) 15 MG tablet Take 15 mg by mouth daily.   metoprolol  succinate (TOPROL -XL) 50 MG 24 hr tablet Take 50 mg by mouth daily. Take with or immediately following a meal.   nitroGLYCERIN  (NITROSTAT ) 0.4 MG SL tablet Place 0.4 mg under the tongue every 5 (five) minutes as needed for chest pain. Reported on 01/11/2016   ondansetron  (ZOFRAN -ODT) 4 MG disintegrating tablet 4mg  ODT q4 hours prn nausea/vomit   pantoprazole  (PROTONIX ) 40 MG tablet Take 1 tablet (40 mg total) by mouth 2 (two) times daily before a meal.   ramipril  (ALTACE ) 10 MG capsule Take 10 mg by mouth 2 (two) times daily.   traZODone  (DESYREL ) 50 MG tablet TAKE 1 TABLET BY MOUTH EVERYDAY AT BEDTIME   predniSONE  (DELTASONE ) 10 MG tablet 1 tablet with food or milk Orally Once a day for 30 days   [DISCONTINUED] ALPRAZolam  (XANAX ) 0.25 MG tablet 1 tablet Orally Twice a day   No facility-administered encounter medications on file as of 07/09/2024.    REVIEW OF SYSTEMS  : All other systems reviewed and negative except where noted in the History of Present Illness.   PHYSICAL EXAM: BP (!) 142/90   Pulse 82   Temp 98.5 F (36.9 C)   Ht 5' 7 (1.702 m)   Wt 190 lb (86.2 kg)   BMI 29.76 kg/m  General: Well developed AA female in no acute distress Head: Normocephalic and atraumatic Eyes:  Sclerae anicteric, conjunctiva pink. Ears: Normal auditory acuity Lungs: Clear throughout to auscultation; no W/R/R. Heart: Regular rate and rhythm; no M/R/G. Abdomen: Soft, non-distended.  BS present.  Diffuse TTP> on the left side. Musculoskeletal: Symmetrical with no gross deformities  Skin: No lesions  on visible extremities Extremities: No edema  Neurological: Alert oriented x 4, grossly non-focal Psychological:  Alert and cooperative. Normal mood and affect  ASSESSMENT AND PLAN: *Large hiatal hernia with Cameron's ulcers: May need surgical evaluation for hernia repair. *Gastric and duodenal AVMs: Will plan for EGD with APC with Dr. Avram at [facility]. *Iron  deficiency anemia: Currently hemoglobin and iron  studies are normal after IV iron  infusions.  This is  likely going to be a recurrent issue with AVMs and Cameron's ulcers. *History of colon polyps: Recall colonoscopy June 2026 *Abdominal pain: Describes lower abdominal pain, same as what she had back in March when she was treated with diverticulitis.  Will treat with Augmentin  875 mg twice daily for 10 days as she was treated previously.  Certainly if she worsens she will go to the ED.  If symptoms persist she will make us  aware and may need to consider repeat CT scan.  Will give Zofran  for her to use as needed while taking her antibiotics per her request.  Prescriptions sent to pharmacy.   CC:  Henson, Vickie L, NP-C  Primary GI physician:  Will actually have her do an enteroscopy in case there are more distal AVMs that could be ablated. She is elderly but I think reasonable to consider possible hiatal hernia repair and if she is willing we will refer her for surgical evaluation.  Lupita CHARLENA Commander, MD, NOLIA

## 2024-07-09 NOTE — Patient Instructions (Signed)
 We have sent the following medications to your pharmacy for you to pick up at your convenience: Augmentin  , Zofran     You have been scheduled for an endoscopy. Please follow written instructions given to you at your visit today.  If you use inhalers (even only as needed), please bring them with you on the day of your procedure.  If you take any of the following medications, they will need to be adjusted prior to your procedure:   DO NOT TAKE 7 DAYS PRIOR TO TEST- Trulicity (dulaglutide) Ozempic, Wegovy (semaglutide) Mounjaro (tirzepatide) Bydureon Bcise (exanatide extended release)  DO NOT TAKE 1 DAY PRIOR TO YOUR TEST Rybelsus (semaglutide) Adlyxin (lixisenatide) Victoza (liraglutide) Byetta (exanatide) ___________________________________________________________________________   _______________________________________________________  If your blood pressure at your visit was 140/90 or greater, please contact your primary care physician to follow up on this.  _______________________________________________________  If you are age 78 or older, your body mass index should be between 23-30. Your Body mass index is 29.76 kg/m. If this is out of the aforementioned range listed, please consider follow up with your Primary Care Provider.  If you are age 78 or younger, your body mass index should be between 19-25. Your Body mass index is 29.76 kg/m. If this is out of the aformentioned range listed, please consider follow up with your Primary Care Provider.   ________________________________________________________  The [facility] providers would like to encourage you to use MYCHART to communicate with providers for non-urgent requests or questions.  Due to long hold times on the telephone, sending your provider a message by [facility] may be a faster and more efficient way to get a response.  Please allow 48 business hours for a response.  Please remember that this is for non-urgent  requests.  _______________________________________________________  [facility] is using a team-based approach to care.  Your team is made up of your doctor and two to three APPS. Our APPS (Nurse Practitioners and Physician Assistants) work with your physician to ensure care continuity for you. They are fully qualified to address your health concerns and develop a treatment plan. They communicate directly with your gastroenterologist to care for you. Seeing the Advanced Practice Practitioners on your physician's team can help you by facilitating care more promptly, often allowing for earlier appointments, access to diagnostic testing, procedures, and other specialty referrals.   Thank you for choosing me and [facility].  Jessica Zehr, PA-C

## 2024-07-10 NOTE — Telephone Encounter (Signed)
-----   Message from [place] D. Zehr sent at 07/10/2024  3:34 PM EDT ----- Regarding: RE: enteroscopy instead of EGD Will do.  I will attach the nursing staff.  Nursing staff, I saw this patient the other day.  She was scheduled for EGD at [facility].  Can we please change it to a small bowel enteroscopy?  Thank you,  Jess ----- Message ----- From: Avram Lupita BRAVO, MD Sent: 07/10/2024   1:15 PM EDT To: Harlene JONETTA Mail, PA-C Subject: enteroscopy instead of EGD                     Think would be good to do an enteroscopy in case there are other AVM's beyond D2  Could you make that change?  Thanks  Lupita

## 2024-07-10 NOTE — Telephone Encounter (Signed)
 Entersocopy has been ordered and changed with [facility] endo

## 2024-07-10 NOTE — Telephone Encounter (Signed)
Returned call to patient. Answered all questions.

## 2024-07-16 ENCOUNTER — Ambulatory Visit: Admitting: Family Medicine

## 2024-07-25 ENCOUNTER — Telehealth: Payer: Self-pay | Admitting: *Deleted

## 2024-07-25 NOTE — Telephone Encounter (Signed)
 Left message for patient to call office.

## 2024-07-25 NOTE — Telephone Encounter (Signed)
-----   Message from Lupita Commander sent at 07/25/2024  1:05 PM EDT ----- Regarding: hiatal hernia [name], can you call her and explain that if she is interested in considering hiatal hernia repair she needs to meet with a surgeon.   If she wants to would refer to Dr. Camellia Blush at [facility]  Thanks - please let me know  CEG ----- Message ----- From: Cayetano Harlene BIRCH, PA-C Sent: 07/09/2024   1:03 PM EDT To: Lupita FORBES Commander, MD

## 2024-08-01 ENCOUNTER — Ambulatory Visit: Admitting: Family Medicine

## 2024-08-01 ENCOUNTER — Encounter: Payer: Self-pay | Admitting: Family Medicine

## 2024-08-01 VITALS — BP 116/72 | HR 60 | Temp 97.9°F | Ht 67.0 in | Wt 189.0 lb

## 2024-08-01 DIAGNOSIS — E538 Deficiency of other specified B group vitamins: Secondary | ICD-10-CM | POA: Diagnosis not present

## 2024-08-01 DIAGNOSIS — I700 Atherosclerosis of aorta: Secondary | ICD-10-CM | POA: Diagnosis not present

## 2024-08-01 DIAGNOSIS — I10 Essential (primary) hypertension: Secondary | ICD-10-CM | POA: Diagnosis not present

## 2024-08-01 DIAGNOSIS — I2583 Coronary atherosclerosis due to lipid rich plaque: Secondary | ICD-10-CM

## 2024-08-01 DIAGNOSIS — I2510 Atherosclerotic heart disease of native coronary artery without angina pectoris: Secondary | ICD-10-CM

## 2024-08-01 DIAGNOSIS — G4700 Insomnia, unspecified: Secondary | ICD-10-CM | POA: Diagnosis not present

## 2024-08-01 DIAGNOSIS — L932 Other local lupus erythematosus: Secondary | ICD-10-CM | POA: Diagnosis not present

## 2024-08-01 NOTE — Patient Instructions (Signed)
 Keep up the good work

## 2024-08-01 NOTE — Progress Notes (Signed)
 Subjective:     Patient ID: [name], female    DOB: [birth year], 78 y.o.   MRN: [id number]  Chief Complaint  Patient presents with   Medical Management of Chronic Issues    HPI  Discussed the use of AI scribe software for clinical note transcription with the patient, who gave verbal consent to proceed.  History of Present Illness [name] is a 78 year old female who presents for follow-up.  Gastrointestinal blood loss and anemia - Recent colonoscopy identified a source of gastrointestinal blood loss, which is currently being addressed. - Previously anemic, completed iron  infusions with significant improvement in iron  levels and overall well-being. - No current symptoms of anemia. - Unable to tolerate high-dose oral iron  supplements due to gastrointestinal side effects. - Considering low-dose or alternative forms of iron  supplementation.  Vitamin b12 status - Takes an over-the-counter B12 supplement. - B12 level checked in June was 404, within satisfactory range.  Systemic lupus erythematosus management - Diagnosed with lupus. - Currently taking Plaquenil 400 mg daily. - Current rheumatologist is retiring; awaiting recommendation for a new provider.  Cardiac history and follow-up - History of coronary artery stent placement over 20 years ago. - No further cardiac interventions required since stent placement. - Seeking a new cardiologist due to discomfort with current practice environment and lack of access to previous cardiologist in the computer system.  Dr. Levern- cardiologist  Dr. Leni- rheumatologist  Dr. Lanny- oncologist    Health Maintenance Due  Topic Date Due   DTaP/Tdap/Td (1 - Tdap) Never done   Zoster Vaccines- Shingrix (1 of 2) Never done   COVID-19 Vaccine (4 - 2024-25 season) 08/12/2023   INFLUENZA VACCINE  07/11/2024    Past Medical History:  Diagnosis Date   Allergic rhinitis    Anemia    Anginal pain (HCC)    Arthritis     Asthma    Breast cancer (HCC) 2015   Right Breast Cancer   Cataract    Coronary artery disease    Emphysema of lung (HCC)    GERD (gastroesophageal reflux disease)    Hypertension    Lupus (systemic lupus erythematosus) (HCC)    Myocardial infarction [facility])     Past Surgical History:  Procedure Laterality Date   CORONARY STENT PLACEMENT  1995   MASTECTOMY Right 2015    Family History  Problem Relation Age of Onset   Allergies Mother    Heart disease Mother    Diabetes Mother    Hypertension Mother    Cancer Father        Lung Cancer   Cancer Sister 6       lung cancer    Colon cancer Maternal Aunt    Cancer Maternal Aunt 76       colon cancer    Cancer Maternal Aunt 5       uterine cancer    Esophageal cancer Neg Hx    Rectal cancer Neg Hx    Stomach cancer Neg Hx     Social History   Socioeconomic History   Marital status: Divorced    Spouse name: Not on file   Number of children: Not on file   Years of education: 12   Highest education level: Not on file  Occupational History   Occupation: retired from the school systems    Employer: RETIRED    Comment: Geologist, engineering  Tobacco Use   Smoking status: Light Smoker    Current packs/day:  0.00    Types: Cigarettes    Last attempt to quit: 04/10/2014    Years since quitting: 10.3   Smokeless tobacco: Never   Tobacco comments:    2 cigarettes a week  Vaping Use   Vaping status: Never Used  Substance and Sexual Activity   Alcohol use: Yes    Comment: occassional   Drug use: No   Sexual activity: Yes    Birth control/protection: Post-menopausal  Other Topics Concern   Not on file  Social History Narrative   Pt is divorced and lives alone.    Regular exercise-no   Caffeine Use-yes   Social Drivers of Health   Financial Resource Strain: Low Risk  (03/12/2024)   Overall Financial Resource Strain (CARDIA)    Difficulty of Paying Living Expenses: Not very hard  Food Insecurity: No Food Insecurity  (03/12/2024)   Hunger Vital Sign    Worried About Running Out of Food in the Last Year: Never true    Ran Out of Food in the Last Year: Never true  Transportation Needs: No Transportation Needs (03/12/2024)   PRAPARE - Administrator, Civil Service (Medical): No    Lack of Transportation (Non-Medical): No  Physical Activity: Inactive (03/12/2024)   Exercise Vital Sign    Days of Exercise per Week: 0 days    Minutes of Exercise per Session: 0 min  Stress: No Stress Concern Present (03/12/2024)   [employer] of Occupational Health - Occupational Stress Questionnaire    Feeling of Stress : Not at all  Social Connections: Moderately Isolated (03/12/2024)   Social Connection and Isolation Panel    Frequency of Communication with Friends and Family: More than three times a week    Frequency of Social Gatherings with Friends and Family: Once a week    Attends Religious Services: More than 4 times per year    Active Member of [employer] or Organizations: No    Attends Banker Meetings: Never    Marital Status: Divorced  Catering manager Violence: Patient Unable To Answer (03/12/2024)   Humiliation, Afraid, Rape, and Kick questionnaire    Fear of Current or Ex-Partner: Patient unable to answer    Emotionally Abused: Patient unable to answer    Physically Abused: Patient unable to answer    Sexually Abused: Patient unable to answer    Outpatient Medications Prior to Visit  Medication Sig Dispense Refill   amLODipine  (NORVASC ) 5 MG tablet Take 5 mg by mouth daily.     aspirin  EC 81 MG EC tablet Take 1 tablet (81 mg total) by mouth daily. 30 tablet 3   atorvastatin  (LIPITOR ) 20 MG tablet Take 20 mg by mouth daily.     hydroxychloroquine (PLAQUENIL) 200 MG tablet Take 200 mg by mouth 2 (two) times daily.     meloxicam (MOBIC) 15 MG tablet Take 15 mg by mouth daily.     metoprolol  succinate (TOPROL -XL) 50 MG 24 hr tablet Take 50 mg by mouth daily. Take with or immediately  following a meal.     nitroGLYCERIN  (NITROSTAT ) 0.4 MG SL tablet Place 0.4 mg under the tongue every 5 (five) minutes as needed for chest pain. Reported on 01/11/2016     ondansetron  (ZOFRAN -ODT) 4 MG disintegrating tablet 4mg  ODT q4 hours prn nausea/vomit 10 tablet 1   pantoprazole  (PROTONIX ) 40 MG tablet Take 1 tablet (40 mg total) by mouth 2 (two) times daily before a meal. 180 tablet 0   ramipril  (ALTACE ) 10  MG capsule Take 10 mg by mouth 2 (two) times daily.     traZODone  (DESYREL ) 50 MG tablet TAKE 1 TABLET BY MOUTH EVERYDAY AT BEDTIME 90 tablet 0   amoxicillin -clavulanate (AUGMENTIN ) 875-125 MG tablet Take 1 tablet by mouth 2 (two) times daily. 20 tablet 0   predniSONE  (DELTASONE ) 10 MG tablet 1 tablet with food or milk Orally Once a day for 30 days (Patient not taking: Reported on 08/01/2024)     No facility-administered medications prior to visit.    Allergies  Allergen Reactions   Codeine Other (See Comments)   Lipitor  [Atorvastatin ] Other (See Comments)    Pt states med makes her feel loopy    Review of Systems  Constitutional:  Negative for chills, fever and malaise/fatigue.  Respiratory:  Negative for shortness of breath.   Cardiovascular:  Negative for chest pain, palpitations and leg swelling.  Gastrointestinal:  Negative for abdominal pain, constipation, diarrhea, nausea and vomiting.  Genitourinary:  Negative for dysuria, frequency and urgency.  Neurological:  Negative for dizziness and focal weakness.       Objective:    Physical Exam Constitutional:      General: She is not in acute distress.    Appearance: She is not ill-appearing.  HENT:     Mouth/Throat:     Mouth: Mucous membranes are moist.     Pharynx: Oropharynx is clear.  Eyes:     Extraocular Movements: Extraocular movements intact.     Conjunctiva/sclera: Conjunctivae normal.     Pupils: Pupils are equal, round, and reactive to light.  Cardiovascular:     Rate and Rhythm: Normal rate and  regular rhythm.  Pulmonary:     Effort: Pulmonary effort is normal.     Breath sounds: Normal breath sounds.  Musculoskeletal:     Cervical back: Normal range of motion and neck supple. No tenderness.     Right lower leg: No edema.     Left lower leg: No edema.  Lymphadenopathy:     Cervical: No cervical adenopathy.  Skin:    General: Skin is warm and dry.  Neurological:     General: No focal deficit present.     Mental Status: She is alert and oriented to person, place, and time.     Motor: No weakness.     Coordination: Coordination normal.     Gait: Gait normal.  Psychiatric:        Mood and Affect: Mood normal.        Behavior: Behavior normal.        Thought Content: Thought content normal.      BP 116/72   Pulse 60   Temp 97.9 F (36.6 C) (Temporal)   Ht 5' 7 (1.702 m)   Wt 189 lb (85.7 kg)   SpO2 99%   BMI 29.60 kg/m  Wt Readings from Last 3 Encounters:  08/01/24 189 lb (85.7 kg)  07/09/24 190 lb (86.2 kg)  06/05/24 193 lb 11.2 oz (87.9 kg)       Assessment & Plan:   Problem List Items Addressed This Visit     Aortic atherosclerosis (HCC)   B12 deficiency   Coronary artery disease   Cutaneous lupus erythematosus   Essential hypertension - Primary   Insomnia    Assessment and Plan Assessment & Plan Atherosclerotic heart disease of native coronary artery without angina No angina symptoms. Stent placed over 20 years ago. No recent catheterization or open heart surgery. Discomfort with current cardiologist's office environment. -  Consider referral to a new cardiologist for follow-up and management.  Lupus erythematosus Managed with hydroxychloroquine 400 mg daily. Previous numbness in arms due to arthritis and lupus interaction. Current rheumatologist is retiring soon. - Contact rheumatology office to discuss transition of care to a new rheumatologist.  Insomnia Previously prescribed trazodone  with significant improvement in sleep. No recent use of  trazodone , improvement attributed to resolution of anemia and better overall health. - Keep trazodone  available for potential future use.  Deficiency of B group vitamins Previous B12 level was 404. Currently taking over-the-counter B12 supplements. Intolerance to iron  supplements due to gastrointestinal side effects. - Continue B12 supplementation. - Consider low-dose iron  supplements  or alternative forms such as liquid or gummies with vitamin C to improve tolerance.     I have discontinued Karne R. Wente's predniSONE  and amoxicillin -clavulanate. I am also having her maintain her ramipril , nitroGLYCERIN , aspirin  EC, metoprolol  succinate, amLODipine , hydroxychloroquine, atorvastatin , traZODone , meloxicam, pantoprazole , and ondansetron .  No orders of the defined types were placed in this encounter.

## 2024-08-19 ENCOUNTER — Other Ambulatory Visit: Payer: Self-pay | Admitting: Physician Assistant

## 2024-08-19 ENCOUNTER — Other Ambulatory Visit: Payer: Self-pay | Admitting: Family Medicine

## 2024-08-20 ENCOUNTER — Telehealth: Payer: Self-pay

## 2024-08-20 MED ORDER — COVID-19 MRNA VACC (MODERNA) 50 MCG/0.5ML IM SUSP: 0.5000 mL | Freq: Once | INTRAMUSCULAR | 0 refills | Status: AC

## 2024-08-20 NOTE — Addendum Note (Signed)
 Addended by: Josceline Chenard E on: 08/20/2024 01:40 PM   Modules accepted: Orders

## 2024-08-20 NOTE — Telephone Encounter (Signed)
 Covid vaccine sent

## 2024-08-20 NOTE — Telephone Encounter (Signed)
 Copied from CRM #[id number]. Topic: Clinical - Medication Question >> Aug 20, 2024 11:16 AM Rea BROCKS wrote: Reason for CRM: Patient is a requesting a covid vaccine prescription.  [phone] (H) Patient contact.   [id number] [address], [place] - [address] [place] [address] Phone: [phone] Fax: [phone]

## 2024-08-20 NOTE — Telephone Encounter (Signed)
 Ok to order

## 2024-09-04 ENCOUNTER — Inpatient Hospital Stay: Attending: Hematology

## 2024-09-04 DIAGNOSIS — D509 Iron deficiency anemia, unspecified: Secondary | ICD-10-CM | POA: Insufficient documentation

## 2024-09-04 DIAGNOSIS — E538 Deficiency of other specified B group vitamins: Secondary | ICD-10-CM

## 2024-09-04 LAB — CBC WITH DIFFERENTIAL/PLATELET
Abs Immature Granulocytes: 0.04 K/uL (ref 0.00–0.07)
Basophils Absolute: 0.1 K/uL (ref 0.0–0.1)
Basophils Relative: 1 %
Eosinophils Absolute: 0.1 K/uL (ref 0.0–0.5)
Eosinophils Relative: 1 %
HCT: 37.9 % (ref 36.0–46.0)
Hemoglobin: 12.1 g/dL (ref 12.0–15.0)
Immature Granulocytes: 0 %
Lymphocytes Relative: 25 %
Lymphs Abs: 2.4 K/uL (ref 0.7–4.0)
MCH: 27.5 pg (ref 26.0–34.0)
MCHC: 31.9 g/dL (ref 30.0–36.0)
MCV: 86.1 fL (ref 80.0–100.0)
Monocytes Absolute: 0.7 K/uL (ref 0.1–1.0)
Monocytes Relative: 7 %
Neutro Abs: 6.2 K/uL (ref 1.7–7.7)
Neutrophils Relative %: 66 %
Platelets: 335 K/uL (ref 150–400)
RBC: 4.4 MIL/uL (ref 3.87–5.11)
RDW: 14.1 % (ref 11.5–15.5)
WBC: 9.6 K/uL (ref 4.0–10.5)
nRBC: 0 % (ref 0.0–0.2)

## 2024-09-04 LAB — FERRITIN: Ferritin: 20 ng/mL (ref 11–307)

## 2024-09-04 LAB — VITAMIN B12: Vitamin B-12: 640 pg/mL (ref 180–914)

## 2024-09-05 ENCOUNTER — Ambulatory Visit: Payer: Self-pay | Admitting: Hematology

## 2024-09-05 NOTE — Telephone Encounter (Addendum)
 Attempted to contact the patient via telephone call. Unable to reach the patient. LVM w/ provider's comments from below.  ----- Message from Onita Mattock sent at 09/05/2024  9:09 AM EDT ----- Please let know her lab results, no anemia, iron  level and B12 level are good, no need IV iron  for now.  Onita Mattock  ----- Message ----- From: Rebecka, [facility] [place] Sent: 09/04/2024   2:10 PM EDT To: Onita Mattock, MD

## 2024-09-08 ENCOUNTER — Encounter (HOSPITAL_COMMUNITY): Payer: Self-pay | Admitting: Internal Medicine

## 2024-09-10 ENCOUNTER — Telehealth: Payer: Self-pay

## 2024-09-10 NOTE — Telephone Encounter (Signed)
 Procedure:EGD Procedure date: 09/15/24 Procedure location: [facility] Arrival Time: 7:30 Spoke with the patient Y/N: N Any prep concerns? N  Has the patient obtained the prep from the pharmacy ? N Do you have a care partner and transportation: N Any additional concerns? N  I called patient 3 times and each time the mail box was full. I was unable to leave the patient a detailed message.

## 2024-09-11 NOTE — Telephone Encounter (Signed)
 Called the patient. No answer. Her mailbox is now working. Left message of my call and my name.

## 2024-09-14 NOTE — H&P (Signed)
 [facility] History and Physical   Primary Care Physician:  [name], NP-C   Reason for Procedure:   Gastric AVM  Plan:    EGD/enteroscopy - ablate AVM's     HPI: [name] is a 78 y.o. female here for EGD/enteroscvopy to ablate gastric avm and look for others  . Colonoscopy 04/2024:   - One 8 to 10 mm polyp at the appendiceal orifice, removed with a cold snare. Resected and retrieved. - Nine 2 to 10 mm polyps in the sigmoid colon, in the descending colon, in the transverse colon and in the cecum, removed with a cold snare. Resected and retrieved. - Diverticulosis in the sigmoid colon, in the descending colon and in the transverse colon. - Internal hemorrhoids. - The examination was otherwise normal on direct and retroflexion views. - Personal history of colonic polyps. 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 - Gastritis. Biopsied. 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 - Duodenitis. - A single non- bleeding angiodysplastic lesion in the duodenum. - The examination was otherwise normal.   1. Surgical [P], gastric antrum :      - MILD REACTIVE GASTROPATHY.      - NEGATIVE FOR H. PYLORI ON H&E STAIN      - NO INTESTINAL METAPLASIA, DYSPLASIA, OR MALIGNANCY.       2. Surgical [P], gastric body :      - GASTRIC MUCOSA WITH NO SPECIFIC PATHOLOGIC CHANGES      - NEGATIVE FOR H. PYLORI ON H&E STAIN      - NEGATIVE FOR INTESTINAL METAPLASIA, DYSPLASIA OR MALIGNANCY       3. Surgical [P], colon, transverse, cecal, polyp (6) :      - TUBULAR ADENOMA(S).      - NO HIGH GRADE DYSPLASIA OR MALIGNANCY.      - SESSILE SERRATED POLYP.      - NO DYSPLASIA OR MALIGNANCY.       4. Surgical [P], colon, appendix, polyp (1) :      - TUBULAR ADENOMA.      - NO HIGH GRADE DYSPLASIA OR MALIGNANCY.       5. Surgical [P], colon, descending, sigmoid, polyp (3) :      -  TUBULAR ADENOMA(S).      - NO HIGH GRADE DYSPLASIA OR MALIGNANCY.    Past Medical History:  Diagnosis Date   Allergic rhinitis    Anemia    Anginal pain    Arthritis    Asthma    Breast cancer (HCC) 2015   Right Breast Cancer   Cataract    Coronary artery disease    Emphysema of lung (HCC)    GERD (gastroesophageal reflux disease)    Hypertension    Lupus (systemic lupus erythematosus) (HCC)    Myocardial infarction [facility])     Past Surgical History:  Procedure Laterality Date   CORONARY STENT PLACEMENT  1995   MASTECTOMY Right 2015     No current facility-administered medications for this encounter.   Current Outpatient Medications  Medication Sig Dispense Refill   amLODipine  (NORVASC ) 5 MG tablet Take 5 mg by mouth daily.     aspirin  EC 81 MG EC tablet Take 1 tablet (81 mg total) by mouth daily. 30 tablet 3   atorvastatin  (LIPITOR ) 20 MG tablet Take 20 mg by mouth daily.     hydroxychloroquine (PLAQUENIL)  200 MG tablet Take 200 mg by mouth 2 (two) times daily.     meloxicam (MOBIC) 15 MG tablet Take 15 mg by mouth daily.     metoprolol  succinate (TOPROL -XL) 50 MG 24 hr tablet Take 50 mg by mouth daily. Take with or immediately following a meal.     nitroGLYCERIN  (NITROSTAT ) 0.4 MG SL tablet Place 0.4 mg under the tongue every 5 (five) minutes as needed for chest pain. Reported on 01/11/2016     ondansetron  (ZOFRAN -ODT) 4 MG disintegrating tablet 4mg  ODT q4 hours prn nausea/vomit 10 tablet 1   pantoprazole  (PROTONIX ) 40 MG tablet TAKE 1 TABLET (40 MG TOTAL) BY MOUTH TWICE A DAY BEFORE MEALS 180 tablet 0   ramipril  (ALTACE ) 10 MG capsule Take 10 mg by mouth 2 (two) times daily.     traZODone  (DESYREL ) 50 MG tablet TAKE 1 TABLET BY MOUTH EVERYDAY AT BEDTIME 90 tablet 0    Allergies as of 07/09/2024 - Review Complete 07/09/2024  Allergen Reaction Noted   Codeine Other (See Comments) 06/05/2023   Lipitor  [atorvastatin ] Other (See Comments) 03/13/2013    Family History   Problem Relation Age of Onset   Allergies Mother    Heart disease Mother    Diabetes Mother    Hypertension Mother    Cancer Father        Lung Cancer   Cancer Sister 71       lung cancer    Colon cancer Maternal Aunt    Cancer Maternal Aunt 78       colon cancer    Cancer Maternal Aunt 31       uterine cancer    Esophageal cancer Neg Hx    Rectal cancer Neg Hx    Stomach cancer Neg Hx     Social History   Socioeconomic History   Marital status: Divorced    Spouse name: Not on file   Number of children: Not on file   Years of education: 12   Highest education level: Not on file  Occupational History   Occupation: retired from the school systems    Employer: RETIRED    Comment: Geologist, engineering  Tobacco Use   Smoking status: Light Smoker    Current packs/day: 0.00    Types: Cigarettes    Last attempt to quit: 04/10/2014    Years since quitting: 10.4   Smokeless tobacco: Never   Tobacco comments:    2 cigarettes a week  Vaping Use   Vaping status: Never Used  Substance and Sexual Activity   Alcohol use: Yes    Comment: occassional   Drug use: No   Sexual activity: Yes    Birth control/protection: Post-menopausal  Other Topics Concern   Not on file  Social History Narrative   Pt is divorced and lives alone.    Regular exercise-no   Caffeine Use-yes   Social Drivers of Health   Financial Resource Strain: Low Risk  (03/12/2024)   Overall Financial Resource Strain (CARDIA)    Difficulty of Paying Living Expenses: Not very hard  Food Insecurity: No Food Insecurity (03/12/2024)   Hunger Vital Sign    Worried About Running Out of Food in the Last Year: Never true    Ran Out of Food in the Last Year: Never true  Transportation Needs: No Transportation Needs (03/12/2024)   PRAPARE - Administrator, Civil Service (Medical): No    Lack of Transportation (Non-Medical): No  Physical Activity: Inactive (03/12/2024)  Exercise Vital Sign    Days of Exercise  per Week: 0 days    Minutes of Exercise per Session: 0 min  Stress: No Stress Concern Present (03/12/2024)   [employer] of Occupational Health - Occupational Stress Questionnaire    Feeling of Stress : Not at all  Social Connections: Moderately Isolated (03/12/2024)   Social Connection and Isolation Panel    Frequency of Communication with Friends and Family: More than three times a week    Frequency of Social Gatherings with Friends and Family: Once a week    Attends Religious Services: More than 4 times per year    Active Member of [employer] or Organizations: No    Attends Banker Meetings: Never    Marital Status: Divorced  Catering manager Violence: Patient Unable To Answer (03/12/2024)   Humiliation, Afraid, Rape, and Kick questionnaire    Fear of Current or Ex-Partner: Patient unable to answer    Emotionally Abused: Patient unable to answer    Physically Abused: Patient unable to answer    Sexually Abused: Patient unable to answer    Review of Systems: Positive for *** All other review of systems negative except as mentioned in the HPI.  Physical Exam: Vital signs Wt 85 kg   BMI 29.35 kg/m   General:   Alert,  Well-developed, well-nourished, pleasant and cooperative in NAD Lungs:  Clear throughout to auscultation.   Heart:  Regular rate and rhythm; no murmurs, clicks, rubs,  or gallops. Abdomen:  Soft, nontender and nondistended. Normal bowel sounds.   Neuro/Psych:  Alert and cooperative. Normal mood and affect. A and O x 3   @Anja Neuzil  CHARLENA Commander, MD, [facility] [phone] (pager) 09/14/2024 8:11 PM@

## 2024-09-15 ENCOUNTER — Ambulatory Visit (HOSPITAL_BASED_OUTPATIENT_CLINIC_OR_DEPARTMENT_OTHER): Payer: Self-pay

## 2024-09-15 ENCOUNTER — Encounter (HOSPITAL_COMMUNITY): Payer: Self-pay | Admitting: Internal Medicine

## 2024-09-15 ENCOUNTER — Ambulatory Visit (HOSPITAL_COMMUNITY): Payer: Self-pay

## 2024-09-15 ENCOUNTER — Encounter (HOSPITAL_COMMUNITY)
Admission: RE | Disposition: A | Discharge status: Home / Self Care | Payer: Self-pay | Source: Home / Self Care | Attending: Internal Medicine

## 2024-09-15 ENCOUNTER — Telehealth: Payer: Self-pay | Admitting: Internal Medicine

## 2024-09-15 ENCOUNTER — Ambulatory Visit (HOSPITAL_COMMUNITY)
Admission: RE | Admit: 2024-09-15 | Discharge: 2024-09-15 | Disposition: A | Discharge status: Home / Self Care | Attending: Internal Medicine | Admitting: Internal Medicine

## 2024-09-15 ENCOUNTER — Other Ambulatory Visit: Payer: Self-pay

## 2024-09-15 DIAGNOSIS — F17200 Nicotine dependence, unspecified, uncomplicated: Secondary | ICD-10-CM | POA: Diagnosis not present

## 2024-09-15 DIAGNOSIS — M329 Systemic lupus erythematosus, unspecified: Secondary | ICD-10-CM | POA: Diagnosis not present

## 2024-09-15 DIAGNOSIS — I10 Essential (primary) hypertension: Secondary | ICD-10-CM | POA: Diagnosis not present

## 2024-09-15 DIAGNOSIS — Z79899 Other long term (current) drug therapy: Secondary | ICD-10-CM | POA: Insufficient documentation

## 2024-09-15 DIAGNOSIS — K449 Diaphragmatic hernia without obstruction or gangrene: Secondary | ICD-10-CM | POA: Diagnosis not present

## 2024-09-15 DIAGNOSIS — D500 Iron deficiency anemia secondary to blood loss (chronic): Secondary | ICD-10-CM | POA: Diagnosis not present

## 2024-09-15 DIAGNOSIS — I2510 Atherosclerotic heart disease of native coronary artery without angina pectoris: Secondary | ICD-10-CM

## 2024-09-15 DIAGNOSIS — K259 Gastric ulcer, unspecified as acute or chronic, without hemorrhage or perforation: Secondary | ICD-10-CM | POA: Diagnosis not present

## 2024-09-15 DIAGNOSIS — K219 Gastro-esophageal reflux disease without esophagitis: Secondary | ICD-10-CM | POA: Diagnosis not present

## 2024-09-15 DIAGNOSIS — I252 Old myocardial infarction: Secondary | ICD-10-CM | POA: Diagnosis not present

## 2024-09-15 DIAGNOSIS — K31819 Angiodysplasia of stomach and duodenum without bleeding: Secondary | ICD-10-CM | POA: Insufficient documentation

## 2024-09-15 DIAGNOSIS — J439 Emphysema, unspecified: Secondary | ICD-10-CM | POA: Insufficient documentation

## 2024-09-15 DIAGNOSIS — Q2730 Arteriovenous malformation, site unspecified: Secondary | ICD-10-CM | POA: Diagnosis not present

## 2024-09-15 DIAGNOSIS — F17210 Nicotine dependence, cigarettes, uncomplicated: Secondary | ICD-10-CM | POA: Insufficient documentation

## 2024-09-15 DIAGNOSIS — K5520 Angiodysplasia of colon without hemorrhage: Secondary | ICD-10-CM | POA: Diagnosis not present

## 2024-09-15 DIAGNOSIS — Z7982 Long term (current) use of aspirin: Secondary | ICD-10-CM | POA: Diagnosis not present

## 2024-09-15 HISTORY — PX: HOT HEMOSTASIS: SHX5433

## 2024-09-15 HISTORY — PX: ENTEROSCOPY: SHX5533

## 2024-09-15 SURGERY — EGD, WITH ARGON PLASMA COAGULATION
Anesthesia: Monitor Anesthesia Care

## 2024-09-15 MED ORDER — PROPOFOL 500 MG/50ML IV EMUL
INTRAVENOUS | Status: DC | PRN
  Administered 2024-09-15: 125 ug/kg/min via INTRAVENOUS
  Administered 2024-09-15: 40 mg via INTRAVENOUS
  Administered 2024-09-15: 80 mg via INTRAVENOUS
  Administered 2024-09-15: 30 mg via INTRAVENOUS
  Administered 2024-09-15: 40 mg via INTRAVENOUS

## 2024-09-15 MED ORDER — SODIUM CHLORIDE 0.9 % IV SOLN: INTRAVENOUS | Status: DC

## 2024-09-15 MED ORDER — LIDOCAINE 2% (20 MG/ML) 5 ML SYRINGE
INTRAMUSCULAR | Status: DC | PRN
  Administered 2024-09-15: 80 mg via INTRAVENOUS

## 2024-09-15 MED ORDER — ONDANSETRON 4 MG PO TBDP: 4.0000 mg | ORAL_TABLET | Freq: Three times a day (TID) | ORAL | 0 refills | Status: AC | PRN

## 2024-09-15 MED ORDER — LIDOCAINE HCL (CARDIAC) PF 100 MG/5ML IV SOSY: PREFILLED_SYRINGE | INTRAVENOUS | Status: DC | PRN

## 2024-09-15 MED ORDER — GLYCOPYRROLATE PF 0.2 MG/ML IJ SOSY
PREFILLED_SYRINGE | INTRAMUSCULAR | Status: DC | PRN
  Administered 2024-09-15: .2 mg via INTRAVENOUS

## 2024-09-15 MED ORDER — PROPOFOL 1000 MG/100ML IV EMUL
INTRAVENOUS | Status: AC
  Filled 2024-09-15: qty 100

## 2024-09-15 MED ORDER — PHENYLEPHRINE 80 MCG/ML (10ML) SYRINGE FOR IV PUSH (FOR BLOOD PRESSURE SUPPORT)
PREFILLED_SYRINGE | INTRAVENOUS | Status: DC | PRN
  Administered 2024-09-15: 80 ug via INTRAVENOUS

## 2024-09-15 NOTE — Telephone Encounter (Signed)
 Called and spoke with pt. Reassured her that Dr. Avram believes it is gas pain. Encouraged pt to try OTC Gas-X and to move as much as possible as this can help to break up the gas. Did advise pt that if pain persists through the night or if it gets worse, or if she develops chest pain or shortness of breath to call the office back or go straight to the ER for further evaluation. Pt verbalized understanding and agrees with plan of care.

## 2024-09-15 NOTE — Telephone Encounter (Signed)
 Spoke with pt. She reports that she is having an achy pain to her lower abdomen and sides. Denies N/V, is able to eat and drink, and has had a bm today. Advised pt this sounds like gas pain and she can try an OTC Gas-X. Will route to Dr. Avram to review and advise as well.

## 2024-09-15 NOTE — Transfer of Care (Signed)
 Immediate Anesthesia Transfer of Care Note  Patient: [name]  Procedure(s) Performed: EGD, WITH ARGON PLASMA COAGULATION ENTEROSCOPY  Patient Location: PACU  Anesthesia Type:MAC  Level of Consciousness: awake and drowsy  Airway & Oxygen Therapy: Patient Spontanous Breathing  Post-op Assessment: Report given to RN and Post -op Vital signs reviewed and stable  Post vital signs: Reviewed and stable  Last Vitals:  Vitals Value Taken Time  BP    Temp    Pulse    Resp    SpO2      Last Pain:  Vitals:   09/15/24 0804  TempSrc: Temporal  PainSc: 0-No pain      Patients Stated Pain Goal: 0 (09/15/24 0804)  Complications: No notable events documented.

## 2024-09-15 NOTE — Telephone Encounter (Signed)
Agree w/RN advice.

## 2024-09-15 NOTE — Telephone Encounter (Signed)
 Inbound call from patient stating she had procedure done today 09/15/24 at [facility] and is now experiencing some abdominal pain. Patient would like to speak to nurse. Please advise  Thank you

## 2024-09-15 NOTE — Op Note (Addendum)
 [facility] Patient Name: [name] Procedure Date: 09/15/2024 MRN: [id number] Attending MD: Lupita FORBES Commander , MD, [id number] Date of Birth: 01-14-1946 CSN: [id number] Age: 78 Admit Type: Outpatient Procedure:                Small bowel enteroscopy Indications:              Iron  deficiency anemia secondary to chronic blood                            loss, Angiodysplasia (of intestine), For therapy of                            angiodysplasia (of intestine) Providers:                Lupita CHARLENA Commander, MD, Darleene Bare, RN, Curtistine Bishop, Technician Referring MD:              Medicines:                Monitored Anesthesia Care Complications:            No immediate complications. Estimated Blood Loss:     Estimated blood loss: none. Procedure:                Pre-Anesthesia Assessment:                           - Prior to the procedure, a History and Physical                            was performed, and patient medications and                            allergies were reviewed. The patient's tolerance of                            previous anesthesia was also reviewed. The risks                            and benefits of the procedure and the sedation                            options and risks were discussed with the patient.                            All questions were answered, and informed consent                            was obtained. Prior Anticoagulants: The patient has                            taken no anticoagulant or antiplatelet agents. ASA  Grade Assessment: III - A patient with severe                            systemic disease. After reviewing the risks and                            benefits, the patient was deemed in satisfactory                            condition to undergo the procedure.                           After obtaining informed consent, the endoscope was                             passed under direct vision. Throughout the                            procedure, the patient's blood pressure, pulse, and                            oxygen saturations were monitored continuously. The                            [id number]-[id number] ([id number] Olympus colonoscope was                            introduced through the mouth and advanced to the                            proximal jejunum. The small bowel enteroscopy was                            accomplished without difficulty. The patient                            tolerated the procedure well. Scope In: Scope Out: Findings:      A single 3 mm angiodysplastic lesion with no bleeding was found on the       greater curvature of the gastric body. Coagulation for bleeding       prevention using argon plasma was successful. Estimated blood loss: none.      A single 3 mm angiodysplastic lesion with no bleeding was found in the       duodenal bulb. Coagulation for bleeding prevention using argon plasma       was successful. Estimated blood loss: none.      Two 1 mm angiodysplastic lesions with no bleeding were found in the       third portion of the duodenum. Coagulation for bleeding prevention using       argon plasma was successful. Estimated blood loss: none.      A 10 cm hiatal hernia with multiple Cameron ulcers was found. The hiatal       narrowing was 45 cm from the incisors. The Z-line was 35 cm from the       incisors.  Exam otherwise normal including gastric retroflexion. Impression:               - A single non-bleeding angiodysplastic lesion in                            the stomach. Treated with argon plasma coagulation                            (APC). Stomach setting                           - A single non-bleeding angiodysplastic lesion in                            the duodenum. Treated with argon plasma coagulation                            (APC). Right colon setting                           - Two non-bleeding  angiodysplastic lesions in the                            duodenum. Treated with argon plasma coagulation                            (APC). Right colon setting                           - 10 cm hiatal hernia with multiple Cameron ulcers.                           - No specimens collected. Exam otherwise normal -                            prior antral gastritis was resolved. Recommendation:           - Patient has a contact number available for                            emergencies. The signs and symptoms of potential                            delayed complications were discussed with the                            patient. Return to normal activities tomorrow.                            Written discharge instructions were provided to the                            patient.                           - Resume previous diet.                           -  Hold 81 mg ASA until 10/16                           - She wants to be evaluated for hiatal hernia                            repair - will refer to Dr. Camellia Blush [facility] - large                            hiatal hernia with Ole erosions Procedure Code(s):        --- Professional ---                           [phone], Small intestinal endoscopy, enteroscopy                            beyond second portion of duodenum, not including                            ileum; with control of bleeding (eg, injection,                            bipolar cautery, unipolar cautery, laser, heater                            probe, stapler, plasma coagulator) Diagnosis Code(s):        --- Professional ---                           K31.819, Angiodysplasia of stomach and duodenum                            without bleeding                           K44.9, Diaphragmatic hernia without obstruction or                            gangrene                           K25.9, Gastric ulcer, unspecified as acute or                            chronic, without hemorrhage  or perforation                           D50.0, Iron  deficiency anemia secondary to blood                            loss (chronic)                           K55.20, Angiodysplasia of colon without hemorrhage CPT copyright 2022 [employer]. All rights reserved. The codes documented in this report are preliminary and upon coder review  may  be revised to meet current compliance requirements. Lupita FORBES Commander, MD 09/15/2024 9:43:07 AM This report has been signed electronically. Number of Addenda: 0

## 2024-09-15 NOTE — Anesthesia Postprocedure Evaluation (Signed)
 Anesthesia Post Note  Patient: [name]  Procedure(s) Performed: EGD, WITH ARGON PLASMA COAGULATION ENTEROSCOPY     Patient location during evaluation: PACU Anesthesia Type: MAC Level of consciousness: awake and alert Pain management: pain level controlled Vital Signs Assessment: post-procedure vital signs reviewed and stable Respiratory status: spontaneous breathing, nonlabored ventilation and respiratory function stable Cardiovascular status: stable and blood pressure returned to baseline Anesthetic complications: no   No notable events documented.  Last Vitals:  Vitals:   09/15/24 0940 09/15/24 0950  BP: (!) 141/65 (!) 166/75  Pulse: 82 74  Resp: 14 (!) 22  Temp:    SpO2: 97% 98%    Last Pain:  Vitals:   09/15/24 0950  TempSrc:   PainSc: 0-No pain                 Debby FORBES Like

## 2024-09-15 NOTE — Discharge Instructions (Addendum)
 I treated the AVMs also called angiodysplasia where you are likely leaking some blood.  I cauterized them.  They should scar over and not bleed anymore.  I will make a referral to a surgeon to have you evaluated for repair of your hiatal hernia.  Please keep any planned follow-up with primary care and the hematologist (blood specialist) regarding your iron  deficiency anemia.  I want you to stop taking your aspirin  until October 16 to help the spots heal.  I appreciate the opportunity to care for you. Lupita CHARLENA Commander, MD, FACG  YOU HAD AN ENDOSCOPIC PROCEDURE TODAY: Refer to the procedure report and other information in the discharge instructions given to you for any specific questions about what was found during the examination. If this information does not answer your questions, please call Dr. Darilyn office at [phone] to clarify.   YOU SHOULD EXPECT: Some feelings of bloating in the abdomen. Passage of more gas than usual. Walking can help get rid of the air that was put into your GI tract during the procedure and reduce the bloating. If you had a lower endoscopy (such as a colonoscopy or flexible sigmoidoscopy) you may notice spotting of blood in your stool or on the toilet paper. Some abdominal soreness may be present for a day or two, also.  DIET: Your first meal following the procedure should be a light meal and then it is ok to progress to your normal diet. A half-sandwich or bowl of soup is an example of a good first meal. Heavy or fried foods are harder to digest and may make you feel nauseous or bloated. Drink plenty of fluids but you should avoid alcoholic beverages for 24 hours.   ACTIVITY: Your care partner should take you home directly after the procedure. You should plan to take it easy, moving slowly for the rest of the day. You can resume normal activity the day after the procedure however YOU SHOULD NOT DRIVE, use power tools, machinery or perform tasks that involve  climbing or major physical exertion for 24 hours (because of the sedation medicines used during the test).   SYMPTOMS TO REPORT IMMEDIATELY: A gastroenterologist can be reached at any hour. Please call [phone]  for any of the following symptoms:   Following upper endoscopy (EGD, EUS, ERCP, esophageal dilation) Vomiting of blood or coffee ground material  New, significant abdominal pain  New, significant chest pain or pain under the shoulder blades  Painful or persistently difficult swallowing  New shortness of breath  Black, tarry-looking or red, bloody stools  FOLLOW UP:  If any biopsies were taken you will be contacted by phone or by letter within the next 1-3 weeks. Call [phone]  if you have not heard about the biopsies in 3 weeks.  Please also call with any specific questions about appointments or follow up tests.

## 2024-09-15 NOTE — Anesthesia Preprocedure Evaluation (Addendum)
 Anesthesia Evaluation  Patient identified by MRN, date of birth, ID band Patient awake    Reviewed: Allergy & Precautions, NPO status , Patient's Chart, lab work & pertinent test results  Airway Mallampati: III  TM Distance: >3 FB Neck ROM: Full    Dental  (+) Upper Dentures, Partial Lower   Pulmonary asthma , COPD,  COPD inhaler, Current Smoker and Patient abstained from smoking.   Pulmonary exam normal        Cardiovascular hypertension, Pt. on medications + CAD, + Past MI and + Cardiac Stents  Normal cardiovascular exam     Neuro/Psych  PSYCHIATRIC DISORDERS Anxiety        GI/Hepatic hiatal hernia, PUD,GERD  Medicated and Controlled,,  Endo/Other    Renal/GU      Musculoskeletal  (+) Arthritis ,    Abdominal   Peds  Hematology   Anesthesia Other Findings SLE  Reproductive/Obstetrics  Breast cancer                               Anesthesia Physical Anesthesia Plan  ASA: 3  Anesthesia Plan: MAC   Post-op Pain Management: Minimal or no pain anticipated   Induction:   PONV Risk Score and Plan: 2 and Propofol infusion and Treatment may vary due to age or medical condition  Airway Management Planned: Nasal Cannula and Natural Airway  Additional Equipment: None  Intra-op Plan:   Post-operative Plan:   Informed Consent: I have reviewed the patients History and Physical, chart, labs and discussed the procedure including the risks, benefits and alternatives for the proposed anesthesia with the patient or authorized representative who has indicated his/her understanding and acceptance.       Plan Discussed with: CRNA and Anesthesiologist  Anesthesia Plan Comments:          Anesthesia Quick Evaluation

## 2024-09-16 ENCOUNTER — Encounter (HOSPITAL_COMMUNITY): Payer: Self-pay | Admitting: Internal Medicine

## 2024-09-18 ENCOUNTER — Telehealth: Payer: Self-pay

## 2024-09-18 DIAGNOSIS — K219 Gastro-esophageal reflux disease without esophagitis: Secondary | ICD-10-CM | POA: Diagnosis not present

## 2024-09-18 DIAGNOSIS — E782 Mixed hyperlipidemia: Secondary | ICD-10-CM | POA: Diagnosis not present

## 2024-09-18 DIAGNOSIS — I2510 Atherosclerotic heart disease of native coronary artery without angina pectoris: Secondary | ICD-10-CM | POA: Diagnosis not present

## 2024-09-18 DIAGNOSIS — I10 Essential (primary) hypertension: Secondary | ICD-10-CM | POA: Diagnosis not present

## 2024-09-18 NOTE — Telephone Encounter (Signed)
 Confirmation fax received from [facility] referral.

## 2024-09-18 NOTE — Telephone Encounter (Signed)
 Per procedure visit note from 09/15/24, referral sent to [facility] for Dr. Camellia Blush for possible hiatal hernia repair.

## 2024-09-24 ENCOUNTER — Ambulatory Visit

## 2024-10-02 ENCOUNTER — Ambulatory Visit

## 2024-10-07 ENCOUNTER — Ambulatory Visit
Admission: RE | Admit: 2024-10-07 | Discharge: 2024-10-07 | Disposition: A | Source: Ambulatory Visit | Attending: Hematology

## 2024-10-07 DIAGNOSIS — E785 Hyperlipidemia, unspecified: Secondary | ICD-10-CM | POA: Diagnosis not present

## 2024-10-07 DIAGNOSIS — Z1231 Encounter for screening mammogram for malignant neoplasm of breast: Secondary | ICD-10-CM

## 2024-10-16 DIAGNOSIS — K257 Chronic gastric ulcer without hemorrhage or perforation: Secondary | ICD-10-CM | POA: Diagnosis not present

## 2024-10-16 DIAGNOSIS — K5520 Angiodysplasia of colon without hemorrhage: Secondary | ICD-10-CM | POA: Diagnosis not present

## 2024-10-16 DIAGNOSIS — K449 Diaphragmatic hernia without obstruction or gangrene: Secondary | ICD-10-CM | POA: Diagnosis not present

## 2024-10-17 ENCOUNTER — Other Ambulatory Visit: Payer: Self-pay | Admitting: General Surgery

## 2024-10-17 ENCOUNTER — Other Ambulatory Visit (HOSPITAL_COMMUNITY): Payer: Self-pay | Admitting: General Surgery

## 2024-10-17 DIAGNOSIS — K2100 Gastro-esophageal reflux disease with esophagitis, without bleeding: Secondary | ICD-10-CM

## 2024-10-17 DIAGNOSIS — K449 Diaphragmatic hernia without obstruction or gangrene: Secondary | ICD-10-CM

## 2024-11-04 ENCOUNTER — Ambulatory Visit (HOSPITAL_COMMUNITY)

## 2024-11-05 ENCOUNTER — Encounter: Admitting: Family Medicine

## 2024-11-19 DIAGNOSIS — I10 Essential (primary) hypertension: Secondary | ICD-10-CM | POA: Diagnosis not present

## 2024-11-19 DIAGNOSIS — E782 Mixed hyperlipidemia: Secondary | ICD-10-CM | POA: Diagnosis not present

## 2024-11-19 DIAGNOSIS — K219 Gastro-esophageal reflux disease without esophagitis: Secondary | ICD-10-CM | POA: Diagnosis not present

## 2024-11-19 DIAGNOSIS — I2510 Atherosclerotic heart disease of native coronary artery without angina pectoris: Secondary | ICD-10-CM | POA: Diagnosis not present

## 2024-11-21 ENCOUNTER — Ambulatory Visit (HOSPITAL_COMMUNITY)
Admission: RE | Admit: 2024-11-21 | Discharge: 2024-11-21 | Disposition: A | Source: Ambulatory Visit | Attending: General Surgery | Admitting: General Surgery

## 2024-11-21 ENCOUNTER — Other Ambulatory Visit (HOSPITAL_COMMUNITY): Payer: Self-pay | Admitting: General Surgery

## 2024-11-21 DIAGNOSIS — K2100 Gastro-esophageal reflux disease with esophagitis, without bleeding: Secondary | ICD-10-CM

## 2024-11-21 DIAGNOSIS — K449 Diaphragmatic hernia without obstruction or gangrene: Secondary | ICD-10-CM

## 2024-12-16 ENCOUNTER — Ambulatory Visit: Payer: Self-pay

## 2024-12-16 NOTE — Telephone Encounter (Signed)
 FYI Only or Action Required?: FYI only for provider: Office visit advised, patient refused. Would like to know if antibiotic could be called in to pharmacy.  Patient was last seen in primary care on 08/01/2024 by [name], NP-C.  Called Nurse Triage reporting Cough and Nasal Congestion.  Symptoms began several days ago.  Interventions attempted: OTC medications: cold/flu medications.  Symptoms are: unchanged.  Triage Disposition: See HCP Within 4 Hours (Or PCP Triage)  Patient/caregiver understands and will follow disposition?: No, wishes to speak with PCP  Reason for Disposition  Wheezing is present  Answer Assessment - Initial Assessment Questions Patient states that she started to experience these symptoms on 1/1. She has productive cough with yellow sputum, head and chest congestion, and wheezing. She has been taking OTC cold and flu medications with no relief. Office visit advised, patient refuses. She would like to know if antibiotic could be called into pharmacy.   1. ONSET: When did the cough begin?      12/11/24  2. SEVERITY: How bad is the cough today?      Severe-chest is sore  3. SPUTUM: Describe the color of your sputum (e.g., none, dry cough; clear, white, yellow, green)     Yellow  4. HEMOPTYSIS: Are you coughing up any blood? If Yes, ask: How much? (e.g., flecks, streaks, tablespoons, etc.)     No  5. DIFFICULTY BREATHING: Are you having difficulty breathing? If Yes, ask: How bad is it? (e.g., mild, moderate, severe)      Denies SOB, states it's difficult to breathe  with congestion  6. FEVER: Do you have a fever? If Yes, ask: What is your temperature, how was it measured, and when did it start?     No known fevers  7. CARDIAC HISTORY: Do you have any history of heart disease? (e.g., heart attack, congestive heart failure)      No  8. LUNG HISTORY: Do you have any history of lung disease?  (e.g., pulmonary embolus, asthma,  emphysema)     No  9. PE RISK FACTORS: Do you have a history of blood clots? (or: recent major surgery, recent prolonged travel, bedridden)     No  10. OTHER SYMPTOMS: Do you have any other symptoms? (e.g., runny nose, wheezing, chest pain)       Runny nose, wheezing, head and chest congestion  11. PREGNANCY: Is there any chance you are pregnant? When was your last menstrual period?       NA  12. TRAVEL: Have you traveled out of the country in the last month? (e.g., travel history, exposures)       No  Protocols used: Cough - Acute Productive-A-AH  Copied from CRM [id number]. Topic: Clinical - Red Word Triage >> Dec 16, 2024 11:22 AM Antwanette L wrote: Red Word that prompted transfer to Nurse Triage: Patient needs to schedule an appointment for a physical but is currently experiencing symptoms including coughing up yellow mucus, chest and head congestion, runny nose, and no fever. Patient is requesting medication for symptom relief

## 2024-12-16 NOTE — Telephone Encounter (Signed)
 Called pt to advise vickie would like her to come to office to be tested, will need ov. Pt states she doesn't have a ride and isnt able to come in and sx aren't better. She states otc remedies are not working and really would like an antibiotic.

## 2024-12-16 NOTE — Telephone Encounter (Signed)
 Copied from CRM [id number]. Topic: General - Other >> Dec 16, 2024  2:49 PM Jayma L wrote: Reason for CRM: patient called asking for a update , symptoms are still the same

## 2024-12-17 NOTE — Telephone Encounter (Signed)
 LM for pt advising if she does not feel better or continues to worsen, please schedule an ov for assessment. No abx will be sent at this time.

## 2024-12-18 ENCOUNTER — Ambulatory Visit: Admitting: Hematology

## 2024-12-18 ENCOUNTER — Other Ambulatory Visit

## 2024-12-25 ENCOUNTER — Encounter: Admitting: Family Medicine

## 2024-12-28 NOTE — Assessment & Plan Note (Signed)
-   On monthly B12 injection

## 2024-12-28 NOTE — Assessment & Plan Note (Signed)
-   She was referred to us  in March 2025 for iron  deficient anemia, likely secondary to chronic blood loss from the hiatal hernia  -Due to poor response to oral iron , I started her on IV iron  in 03/2024. She responded well and anemia resolved

## 2024-12-28 NOTE — Assessment & Plan Note (Signed)
 multifocal invasive ductal carcinoma, pT1aN0M0, stage IA, ER+/PR+, and background DCIS  -diagnosed in 05/2014 -She is status post mastectomy, received adjuvant exemestane  but did not follow up closely

## 2024-12-29 ENCOUNTER — Telehealth: Payer: Self-pay

## 2024-12-29 ENCOUNTER — Inpatient Hospital Stay

## 2024-12-29 ENCOUNTER — Inpatient Hospital Stay: Admitting: Hematology

## 2024-12-29 DIAGNOSIS — C50411 Malignant neoplasm of upper-outer quadrant of right female breast: Secondary | ICD-10-CM

## 2024-12-29 DIAGNOSIS — E538 Deficiency of other specified B group vitamins: Secondary | ICD-10-CM

## 2024-12-29 DIAGNOSIS — D500 Iron deficiency anemia secondary to blood loss (chronic): Secondary | ICD-10-CM

## 2024-12-29 NOTE — Telephone Encounter (Signed)
 Called pt due to her not showing up to an appt , pt stated she got her dates mixed up and has been battling the flu. Transferred pt to scheduling to reschedule her appt with no further questions.

## 2024-12-31 ENCOUNTER — Telehealth: Payer: Self-pay | Admitting: Hematology

## 2025-01-01 ENCOUNTER — Ambulatory Visit: Admitting: Family Medicine

## 2025-01-01 ENCOUNTER — Encounter: Payer: Self-pay | Admitting: Family Medicine

## 2025-01-01 VITALS — BP 134/82 | HR 98 | Temp 97.6°F | Ht 67.0 in | Wt 197.0 lb

## 2025-01-01 DIAGNOSIS — Z0000 Encounter for general adult medical examination without abnormal findings: Secondary | ICD-10-CM

## 2025-01-01 DIAGNOSIS — E538 Deficiency of other specified B group vitamins: Secondary | ICD-10-CM | POA: Diagnosis not present

## 2025-01-01 DIAGNOSIS — I700 Atherosclerosis of aorta: Secondary | ICD-10-CM | POA: Diagnosis not present

## 2025-01-01 DIAGNOSIS — Z0001 Encounter for general adult medical examination with abnormal findings: Secondary | ICD-10-CM

## 2025-01-01 DIAGNOSIS — Z23 Encounter for immunization: Secondary | ICD-10-CM | POA: Diagnosis not present

## 2025-01-01 DIAGNOSIS — E782 Mixed hyperlipidemia: Secondary | ICD-10-CM

## 2025-01-01 DIAGNOSIS — Z862 Personal history of diseases of the blood and blood-forming organs and certain disorders involving the immune mechanism: Secondary | ICD-10-CM

## 2025-01-01 DIAGNOSIS — G4700 Insomnia, unspecified: Secondary | ICD-10-CM | POA: Diagnosis not present

## 2025-01-01 DIAGNOSIS — I10 Essential (primary) hypertension: Secondary | ICD-10-CM | POA: Diagnosis not present

## 2025-01-01 DIAGNOSIS — M329 Systemic lupus erythematosus, unspecified: Secondary | ICD-10-CM

## 2025-01-01 DIAGNOSIS — I2583 Coronary atherosclerosis due to lipid rich plaque: Secondary | ICD-10-CM

## 2025-01-01 DIAGNOSIS — F17200 Nicotine dependence, unspecified, uncomplicated: Secondary | ICD-10-CM | POA: Insufficient documentation

## 2025-01-01 DIAGNOSIS — L930 Discoid lupus erythematosus: Secondary | ICD-10-CM

## 2025-01-01 DIAGNOSIS — I2510 Atherosclerotic heart disease of native coronary artery without angina pectoris: Secondary | ICD-10-CM | POA: Diagnosis not present

## 2025-01-01 DIAGNOSIS — R7309 Other abnormal glucose: Secondary | ICD-10-CM

## 2025-01-01 LAB — COMPREHENSIVE METABOLIC PANEL WITH GFR
ALT: 10 U/L (ref 3–35)
AST: 15 U/L (ref 5–37)
Albumin: 4.5 g/dL (ref 3.5–5.2)
Alkaline Phosphatase: 64 U/L (ref 39–117)
BUN: 15 mg/dL (ref 6–23)
CO2: 21 meq/L (ref 19–32)
Calcium: 9.9 mg/dL (ref 8.4–10.5)
Chloride: 103 meq/L (ref 96–112)
Creatinine, Ser: 1.1 mg/dL (ref 0.40–1.20)
GFR: 47.97 mL/min — ABNORMAL LOW
Glucose, Bld: 80 mg/dL (ref 70–99)
Potassium: 4.3 meq/L (ref 3.5–5.1)
Sodium: 137 meq/L (ref 135–145)
Total Bilirubin: 0.3 mg/dL (ref 0.2–1.2)
Total Protein: 7.7 g/dL (ref 6.0–8.3)

## 2025-01-01 LAB — FERRITIN: Ferritin: 13.6 ng/mL (ref 10.0–291.0)

## 2025-01-01 LAB — CBC WITH DIFFERENTIAL/PLATELET
Basophils Absolute: 0.1 K/uL (ref 0.0–0.1)
Basophils Relative: 1.1 % (ref 0.0–3.0)
Eosinophils Absolute: 0.2 K/uL (ref 0.0–0.7)
Eosinophils Relative: 1.9 % (ref 0.0–5.0)
HCT: 38.5 % (ref 36.0–46.0)
Hemoglobin: 12 g/dL (ref 12.0–15.0)
Lymphocytes Relative: 22.2 % (ref 12.0–46.0)
Lymphs Abs: 2 K/uL (ref 0.7–4.0)
MCHC: 31.2 g/dL (ref 30.0–36.0)
MCV: 83 fl (ref 78.0–100.0)
Monocytes Absolute: 0.6 K/uL (ref 0.1–1.0)
Monocytes Relative: 6.4 % (ref 3.0–12.0)
Neutro Abs: 6.2 K/uL (ref 1.4–7.7)
Neutrophils Relative %: 68.4 % (ref 43.0–77.0)
Platelets: 486 K/uL — ABNORMAL HIGH (ref 150.0–400.0)
RBC: 4.64 Mil/uL (ref 3.87–5.11)
RDW: 15.3 % (ref 11.5–15.5)
WBC: 9.1 K/uL (ref 4.0–10.5)

## 2025-01-01 LAB — TSH: TSH: 0.74 u[IU]/mL (ref 0.35–5.50)

## 2025-01-01 LAB — LIPID PANEL
Cholesterol: 156 mg/dL (ref 28–200)
HDL: 46.2 mg/dL
LDL Cholesterol: 83 mg/dL (ref 10–99)
NonHDL: 109.54
Total CHOL/HDL Ratio: 3
Triglycerides: 133 mg/dL (ref 10.0–149.0)
VLDL: 26.6 mg/dL (ref 0.0–40.0)

## 2025-01-01 LAB — FOLATE: Folate: 16.9 ng/mL

## 2025-01-01 LAB — VITAMIN B12: Vitamin B-12: 398 pg/mL (ref 211–911)

## 2025-01-01 LAB — HEMOGLOBIN A1C: Hgb A1c MFr Bld: 5.8 % (ref 4.6–6.5)

## 2025-01-01 NOTE — Patient Instructions (Addendum)
 Please go downstairs for labs before you leave   Check with your pharmacy regarding the Tdap and Shingrix   Remember to follow up with your eye doctor

## 2025-01-01 NOTE — Progress Notes (Signed)
 "  Complete physical exam  Patient: [name]   DOB: 05-Oct-1946   79 y.o. Female  MRN: [id number]  Subjective:    Chief Complaint  Patient presents with   Annual Exam   She is here for a complete physical exam.   Discussed the use of AI scribe software for clinical note transcription with the patient, who gave verbal consent to proceed.  History of Present Illness [name] is a 79 year old female who presents for her annual physical exam and preventive healthcare visit.  Cardiovascular disease and risk management - Atherosclerosis with coronary stent placement, recently saw her cardiologist  - Unable to tolerate statins, currently taking alternative cholesterol medication (name unknown) - Blood pressure elevated at last hernia specialist visit, not cleared for surgery - Concerned about heart health and due for cholesterol blood work  Systemic lupus erythematosus - Currently taking hydroxychloroquine for lupus management - Keeps prednisone  available for disease flares - Transitioned care to a new rheumatologist after previous provider retired  Sleep disturbance - Experiencing nightmares with current sleep medication at 50 mg - Plans to reduce dose to 25 mg to improve symptoms  Gastrointestinal bleeding - History of gastrointestinal bleeding, in contact with gastroenterologist regarding prior issues  Hernia - Following with hernia specialist - Surgical clearance delayed due to elevated blood pressure  Preventive health maintenance - Mammogram performed in October - Colonoscopy up to date - Received influenza vaccine today - Needs shingles and tetanus vaccines from pharmacy  Ophthalmologic health - No eye exam since right cataract removal - No ophthalmology evaluation while on hydroxychloroquine  Dental health - Wears dentures - No recent dental evaluation  Laboratory monitoring - Due for blood work to assess B12, iron , and cholesterol  Recent viral  illness and weight loss - Recent weight loss following viral illness, likely contracted while caring for cousin in nursing home - Did not test positive for influenza  Tobacco use - Smokes and not ready to stop   Genitourinary symptoms - No urinary problems - No pain      Health Maintenance  Topic Date Due   DTaP/Tdap/Td vaccine (1 - Tdap) Never done   Zoster (Shingles) Vaccine (1 of 2) Never done   COVID-19 Vaccine (4 - 2025-26 season) 01/17/2025*   Medicare Annual Wellness Visit  03/12/2025   Colon Cancer Screening  05/08/2025   Breast Cancer Screening  10/07/2025   Pneumococcal Vaccine for age over 32  Completed   Flu Shot  Completed   Osteoporosis screening with Bone Density Scan  Completed   Hepatitis C Screening  Completed   Meningitis B Vaccine  Aged Out  *Topic was postponed. The date shown is not the original due date.    Depression screening:    03/19/2024    2:19 PM 03/12/2024    2:30 PM 10/31/2023    1:08 PM  Depression screen PHQ 2/9  Decreased Interest 2 0 0  Down, Depressed, Hopeless 1 0 0  PHQ - 2 Score 3 0 0  Altered sleeping 3 2   Tired, decreased energy 3 1   Change in appetite 2 0   Feeling bad or failure about yourself  1 0   Trouble concentrating 0 0   Moving slowly or fidgety/restless 0 0   Suicidal thoughts 0 0   PHQ-9 Score 12  3    Difficult doing work/chores  Not difficult at all      Data saved with a previous flowsheet row  definition   Anxiety Screening:     No data to display           Patient Care Team: [name], NP-C as PCP - General (Family Medicine) [name], MD as Consulting Physician (Cardiology) [name], MD as Consulting Physician (Gastroenterology) [facility]   Show/hide medication list[1]  Review of Systems  Constitutional:  Negative for chills, fever, malaise/fatigue and weight loss.  HENT:  Negative for congestion, ear pain, sinus pain and sore throat.   Eyes:  Negative  for blurred vision, double vision and pain.  Respiratory:  Negative for cough, shortness of breath and wheezing.   Cardiovascular:  Negative for chest pain, palpitations and leg swelling.  Gastrointestinal:  Negative for abdominal pain, constipation, diarrhea, nausea and vomiting.  Genitourinary:  Negative for dysuria, frequency and urgency.  Musculoskeletal:  Negative for back pain, joint pain and myalgias.  Skin:  Negative for rash.  Neurological:  Negative for dizziness, tingling, focal weakness and headaches.  Endo/Heme/Allergies:  Does not bruise/bleed easily.  Psychiatric/Behavioral:  Negative for depression, memory loss and suicidal ideas. The patient is not nervous/anxious.        Objective:    BP 134/82   Pulse 98   Temp 97.6 F (36.4 C) (Temporal)   Ht 5' 7 (1.702 m)   Wt 197 lb (89.4 kg)   SpO2 100%   BMI 30.85 kg/m  BP Readings from Last 3 Encounters:  01/01/25 134/82  09/15/24 (!) 166/75  08/01/24 116/72   Wt Readings from Last 3 Encounters:  01/01/25 197 lb (89.4 kg)  09/15/24 187 lb 6.3 oz (85 kg)  08/01/24 189 lb (85.7 kg)    Physical Exam Constitutional:      General: She is not in acute distress.    Appearance: She is not ill-appearing.  HENT:     Right Ear: Tympanic membrane, ear canal and external ear normal.     Left Ear: Tympanic membrane, ear canal and external ear normal.     Nose: Nose normal.     Mouth/Throat:     Mouth: Mucous membranes are moist.     Pharynx: Oropharynx is clear.  Eyes:     Extraocular Movements: Extraocular movements intact.     Conjunctiva/sclera: Conjunctivae normal.     Pupils: Pupils are equal, round, and reactive to light.  Neck:     Thyroid : No thyroid  mass, thyromegaly or thyroid  tenderness.  Cardiovascular:     Rate and Rhythm: Normal rate and regular rhythm.     Pulses: Normal pulses.     Heart sounds: Normal heart sounds.  Pulmonary:     Effort: Pulmonary effort is normal.     Breath sounds: Normal  breath sounds.  Abdominal:     General: Bowel sounds are normal.     Palpations: Abdomen is soft.     Tenderness: There is no abdominal tenderness. There is no right CVA tenderness, left CVA tenderness, guarding or rebound.  Musculoskeletal:        General: Normal range of motion.     Cervical back: Normal range of motion and neck supple. No tenderness.     Right lower leg: No edema.     Left lower leg: No edema.  Lymphadenopathy:     Cervical: No cervical adenopathy.  Skin:    General: Skin is warm and dry.     Findings: No lesion or rash.  Neurological:     General: No focal deficit present.  Mental Status: She is alert and oriented to person, place, and time.     Cranial Nerves: No cranial nerve deficit.     Sensory: No sensory deficit.     Motor: No weakness.     Gait: Gait normal.  Psychiatric:        Mood and Affect: Mood normal.        Behavior: Behavior normal.        Thought Content: Thought content normal.      Results for orders placed or performed in visit on 01/01/25  TSH  Result Value Ref Range   TSH 0.74 0.35 - 5.50 uIU/mL  Hemoglobin A1c  Result Value Ref Range   Hgb A1c MFr Bld 5.8 4.6 - 6.5 %  Lipid panel  Result Value Ref Range   Cholesterol 156 28 - 200 mg/dL   Triglycerides 866.9 89.9 - 149.0 mg/dL   HDL 53.79 >60.99 mg/dL   VLDL 73.3 0.0 - 59.9 mg/dL   LDL Cholesterol 83 10 - 99 mg/dL   Total CHOL/HDL Ratio 3    NonHDL 109.54   Vitamin B12  Result Value Ref Range   Vitamin B-12 398 211 - 911 pg/mL  Folate  Result Value Ref Range   Folate 16.9 >5.9 ng/mL  Ferritin  Result Value Ref Range   Ferritin 13.6 10.0 - 291.0 ng/mL  Comprehensive metabolic panel with GFR  Result Value Ref Range   Sodium 137 135 - 145 mEq/L   Potassium 4.3 3.5 - 5.1 mEq/L   Chloride 103 96 - 112 mEq/L   CO2 21 19 - 32 mEq/L   Glucose, Bld 80 70 - 99 mg/dL   BUN 15 6 - 23 mg/dL   Creatinine, Ser 8.89 0.40 - 1.20 mg/dL   Total Bilirubin 0.3 0.2 - 1.2 mg/dL    Alkaline Phosphatase 64 39 - 117 U/L   AST 15 5 - 37 U/L   ALT 10 3 - 35 U/L   Total Protein 7.7 6.0 - 8.3 g/dL   Albumin 4.5 3.5 - 5.2 g/dL   GFR 52.02 (L) >39.99 mL/min   Calcium  9.9 8.4 - 10.5 mg/dL  CBC with Differential/Platelet  Result Value Ref Range   WBC 9.1 4.0 - 10.5 K/uL   RBC 4.64 3.87 - 5.11 Mil/uL   Hemoglobin 12.0 12.0 - 15.0 g/dL   HCT 61.4 63.9 - 53.9 %   MCV 83.0 78.0 - 100.0 fl   MCHC 31.2 30.0 - 36.0 g/dL   RDW 84.6 88.4 - 84.4 %   Platelets 486.0 (H) 150.0 - 400.0 K/uL   Neutrophils Relative % 68.4 43.0 - 77.0 %   Lymphocytes Relative 22.2 12.0 - 46.0 %   Monocytes Relative 6.4 3.0 - 12.0 %   Eosinophils Relative 1.9 0.0 - 5.0 %   Basophils Relative 1.1 0.0 - 3.0 %   Neutro Abs 6.2 1.4 - 7.7 K/uL   Lymphs Abs 2.0 0.7 - 4.0 K/uL   Monocytes Absolute 0.6 0.1 - 1.0 K/uL   Eosinophils Absolute 0.2 0.0 - 0.7 K/uL   Basophils Absolute 0.1 0.0 - 0.1 K/uL      Assessment & Plan:    Routine Health Maintenance and Physical Exam Problem List Items Addressed This Visit     Aortic atherosclerosis   Relevant Orders   Lipid panel (Completed)   B12 deficiency   Relevant Orders   Vitamin B12 (Completed)   Coronary artery disease   Essential hypertension   Relevant Orders  CBC with Differential/Platelet (Completed)   Comprehensive metabolic panel with GFR (Completed)   TSH (Completed)   Insomnia   Lupus erythematosus   Tobacco use disorder   Other Visit Diagnoses       Encounter for general adult medical examination with abnormal findings    -  Primary     Need for influenza vaccination       Relevant Orders   Flu vaccine HIGH DOSE PF(Fluzone Trivalent) (Completed)     History of iron  deficiency anemia       Relevant Orders   CBC with Differential/Platelet (Completed)   Ferritin (Completed)   Folate (Completed)   Vitamin B12 (Completed)     Mixed hyperlipidemia       Relevant Orders   Lipid panel (Completed)     Elevated glucose       Relevant  Orders   Comprehensive metabolic panel with GFR (Completed)   Hemoglobin A1c (Completed)       Assessment and Plan Assessment & Plan Adult Wellness Visit Annual physical exam and preventive healthcare visit conducted. Recent mammogram in October was normal. Colonoscopy is up to date. Flu shot received today. Shingles and tetanus vaccines needed. Last eye exam was after cataract removal from right eye. Dentures in place, no recent dental visits. - Ordered blood work including B12, iron , and cholesterol levels - Advised to obtain shingles and tetanus vaccines at pharmacy - Encouraged follow-up for eye exam  Systemic lupus erythematosus Managed with hydroxychloroquine (Plaquenil) and prednisone  for flares. Rheumatologist retired, new appointment scheduled next week. - Continue hydroxychloroquine as prescribed by rheumatologist - Keep prednisone  on hand for flares - Attend rheumatology appointment next week  Atherosclerotic cardiovascular disease Coronary stent placement. Concerns about heart health and potential need for further testing. Unable to take statins, currently on an alternative medication. - Will confirm current cholesterol medication and adjust treatment as needed  Essential hypertension Blood pressure well-controlled on current medication regimen. - Continue current antihypertensive medications  Vitamin B12 deficiency Vitamin B12 levels to be checked with blood work. 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 Recent gastroenterology visit addressed bleeding issues, no further blood loss expected. - Ordered blood work to check iron  levels  Insomnia Trazodone  tried, but patient reports nightmares as a side effect of 50 mg dose. - Reduce trazodone  dose to 25 mg and monitor for side effects     Return in about 6 months (around 07/01/2025) for chronic health  conditions.     Boby Mackintosh, NP-C      [1]  Outpatient Medications Prior to Visit  Medication Sig   amLODipine  (NORVASC ) 5 MG tablet Take 5 mg by mouth daily.   aspirin  EC 81 MG tablet Take 1 tablet (81 mg total) by mouth daily. Stop/hold and restart on September 25 2024   hydroxychloroquine (PLAQUENIL) 200 MG tablet Take 200 mg by mouth 2 (two) times daily.   metoprolol  succinate (TOPROL -XL) 50 MG 24 hr tablet Take 50 mg by mouth daily. Take with or immediately following a meal.   nitroGLYCERIN  (NITROSTAT ) 0.4 MG SL tablet Place 0.4 mg under the tongue every 5 (five) minutes as needed for chest pain. Reported on 01/11/2016   ondansetron  (ZOFRAN -ODT) 4 MG disintegrating tablet 4mg  ODT q4 hours prn nausea/vomit   ondansetron  (ZOFRAN -ODT) 4 MG disintegrating tablet Take 1 tablet (4 mg total) by mouth every 8 (eight) hours as needed for nausea or vomiting.  pantoprazole  (PROTONIX ) 40 MG tablet TAKE 1 TABLET (40 MG TOTAL) BY MOUTH TWICE A DAY BEFORE MEALS   predniSONE  (DELTASONE ) 10 MG tablet Take 10 mg by mouth as needed (for joint swelling related to lupus).   ramipril  (ALTACE ) 10 MG capsule Take 10 mg by mouth 2 (two) times daily.   traZODone  (DESYREL ) 50 MG tablet TAKE 1 TABLET BY MOUTH EVERYDAY AT BEDTIME   No facility-administered medications prior to visit.   "

## 2025-01-06 ENCOUNTER — Ambulatory Visit: Payer: Self-pay | Admitting: Family Medicine

## 2025-01-06 NOTE — Progress Notes (Signed)
 Please let her know that her kidney function is decreased, stage 3 kidney disease. Please ask her to make sure she is staying well hydrated. Also, her lupus could be affecting her kidneys and I would like for her to discuss this with her rheumatologist, when is her next visit?  Her vitamin B12 is low normal. Please take an over the counter B12 supplement if she is not already. She may be getting B12 injections at her hematologist?  Her labs are stable otherwise.

## 2025-01-14 ENCOUNTER — Inpatient Hospital Stay: Admitting: Hematology

## 2025-01-14 ENCOUNTER — Inpatient Hospital Stay

## 2025-01-26 ENCOUNTER — Inpatient Hospital Stay

## 2025-01-26 ENCOUNTER — Inpatient Hospital Stay: Admitting: Hematology
